# Patient Record
Sex: Female | Born: 1950
Health system: Southern US, Community
[De-identification: ages and names within clinical notes are randomized; demographics above are authoritative.]

## PROBLEM LIST (undated history)

## (undated) DIAGNOSIS — E785 Hyperlipidemia, unspecified: Secondary | ICD-10-CM

## (undated) DIAGNOSIS — A159 Respiratory tuberculosis unspecified: Secondary | ICD-10-CM

## (undated) DIAGNOSIS — M1990 Unspecified osteoarthritis, unspecified site: Secondary | ICD-10-CM

## (undated) DIAGNOSIS — F32A Depression, unspecified: Secondary | ICD-10-CM

## (undated) DIAGNOSIS — H269 Unspecified cataract: Secondary | ICD-10-CM

## (undated) DIAGNOSIS — E7800 Pure hypercholesterolemia, unspecified: Secondary | ICD-10-CM

## (undated) DIAGNOSIS — Z86010 Personal history of colonic polyps: Secondary | ICD-10-CM

## (undated) DIAGNOSIS — T7840XA Allergy, unspecified, initial encounter: Secondary | ICD-10-CM

## (undated) DIAGNOSIS — F329 Major depressive disorder, single episode, unspecified: Secondary | ICD-10-CM

## (undated) DIAGNOSIS — R011 Cardiac murmur, unspecified: Secondary | ICD-10-CM

## (undated) DIAGNOSIS — S52122A Displaced fracture of head of left radius, initial encounter for closed fracture: Secondary | ICD-10-CM

## (undated) DIAGNOSIS — F419 Anxiety disorder, unspecified: Secondary | ICD-10-CM

## (undated) DIAGNOSIS — M810 Age-related osteoporosis without current pathological fracture: Secondary | ICD-10-CM

## (undated) DIAGNOSIS — K219 Gastro-esophageal reflux disease without esophagitis: Secondary | ICD-10-CM

## (undated) HISTORY — DX: Major depressive disorder, single episode, unspecified: F32.9

## (undated) HISTORY — DX: Unspecified cataract: H26.9

## (undated) HISTORY — DX: Allergy, unspecified, initial encounter: T78.40XA

## (undated) HISTORY — PX: CATARACT EXTRACTION: SUR2

## (undated) HISTORY — DX: Unspecified osteoarthritis, unspecified site: M19.90

## (undated) HISTORY — PX: POLYPECTOMY: SHX149

## (undated) HISTORY — DX: Respiratory tuberculosis unspecified: A15.9

## (undated) HISTORY — DX: Age-related osteoporosis without current pathological fracture: M81.0

## (undated) HISTORY — DX: Hyperlipidemia, unspecified: E78.5

## (undated) HISTORY — DX: Cardiac murmur, unspecified: R01.1

## (undated) HISTORY — DX: Anxiety disorder, unspecified: F41.9

## (undated) HISTORY — PX: COLONOSCOPY: SHX174

## (undated) HISTORY — DX: Personal history of colonic polyps: Z86.010

## (undated) HISTORY — DX: Depression, unspecified: F32.A

---

## 2003-01-18 ENCOUNTER — Encounter: Admission: RE | Admit: 2003-01-18 | Discharge: 2003-01-18 | Payer: Self-pay | Admitting: Internal Medicine

## 2003-01-18 ENCOUNTER — Ambulatory Visit (HOSPITAL_COMMUNITY): Admission: RE | Admit: 2003-01-18 | Discharge: 2003-01-18 | Payer: Self-pay | Admitting: Internal Medicine

## 2004-09-23 ENCOUNTER — Ambulatory Visit: Payer: Self-pay | Admitting: Internal Medicine

## 2004-09-24 ENCOUNTER — Ambulatory Visit: Payer: Self-pay | Admitting: Internal Medicine

## 2004-09-28 ENCOUNTER — Ambulatory Visit: Payer: Self-pay | Admitting: *Deleted

## 2004-10-16 ENCOUNTER — Ambulatory Visit: Payer: Self-pay | Admitting: Internal Medicine

## 2004-10-30 ENCOUNTER — Ambulatory Visit (HOSPITAL_COMMUNITY): Admission: RE | Admit: 2004-10-30 | Discharge: 2004-10-30 | Payer: Self-pay | Admitting: Family Medicine

## 2004-11-26 ENCOUNTER — Ambulatory Visit: Payer: Self-pay | Admitting: Internal Medicine

## 2005-02-05 ENCOUNTER — Ambulatory Visit: Payer: Self-pay | Admitting: Internal Medicine

## 2005-03-09 ENCOUNTER — Ambulatory Visit: Payer: Self-pay | Admitting: Internal Medicine

## 2005-04-20 ENCOUNTER — Ambulatory Visit: Payer: Self-pay | Admitting: Internal Medicine

## 2005-09-23 ENCOUNTER — Ambulatory Visit: Payer: Self-pay | Admitting: Internal Medicine

## 2005-12-13 ENCOUNTER — Ambulatory Visit: Payer: Self-pay | Admitting: Internal Medicine

## 2006-01-12 ENCOUNTER — Ambulatory Visit: Payer: Self-pay | Admitting: Internal Medicine

## 2006-01-12 ENCOUNTER — Encounter: Payer: Self-pay | Admitting: Internal Medicine

## 2006-01-20 ENCOUNTER — Ambulatory Visit (HOSPITAL_COMMUNITY): Admission: RE | Admit: 2006-01-20 | Discharge: 2006-01-20 | Payer: Self-pay | Admitting: Family Medicine

## 2006-03-10 ENCOUNTER — Ambulatory Visit: Payer: Self-pay | Admitting: Internal Medicine

## 2006-03-14 ENCOUNTER — Ambulatory Visit: Payer: Self-pay | Admitting: Internal Medicine

## 2006-04-07 ENCOUNTER — Ambulatory Visit: Payer: Self-pay | Admitting: Internal Medicine

## 2006-08-05 ENCOUNTER — Ambulatory Visit: Payer: Self-pay | Admitting: Internal Medicine

## 2006-11-16 ENCOUNTER — Ambulatory Visit: Payer: Self-pay | Admitting: Family Medicine

## 2006-12-22 ENCOUNTER — Ambulatory Visit: Payer: Self-pay | Admitting: Internal Medicine

## 2007-01-02 ENCOUNTER — Ambulatory Visit: Payer: Self-pay | Admitting: Internal Medicine

## 2007-01-03 ENCOUNTER — Ambulatory Visit: Payer: Self-pay | Admitting: Internal Medicine

## 2007-02-08 ENCOUNTER — Ambulatory Visit (HOSPITAL_COMMUNITY): Admission: RE | Admit: 2007-02-08 | Discharge: 2007-02-08 | Payer: Self-pay | Admitting: Family Medicine

## 2007-08-16 ENCOUNTER — Encounter (INDEPENDENT_AMBULATORY_CARE_PROVIDER_SITE_OTHER): Payer: Self-pay | Admitting: *Deleted

## 2007-09-11 ENCOUNTER — Telehealth (INDEPENDENT_AMBULATORY_CARE_PROVIDER_SITE_OTHER): Payer: Self-pay | Admitting: *Deleted

## 2007-09-18 ENCOUNTER — Ambulatory Visit: Payer: Self-pay | Admitting: Family Medicine

## 2007-10-03 ENCOUNTER — Ambulatory Visit: Payer: Self-pay | Admitting: Nurse Practitioner

## 2007-10-03 DIAGNOSIS — K029 Dental caries, unspecified: Secondary | ICD-10-CM | POA: Insufficient documentation

## 2007-10-03 DIAGNOSIS — K219 Gastro-esophageal reflux disease without esophagitis: Secondary | ICD-10-CM | POA: Insufficient documentation

## 2007-10-03 LAB — CONVERTED CEMR LAB
ALT: 14 units/L (ref 0–35)
AST: 18 units/L (ref 0–37)
Albumin: 4.5 g/dL (ref 3.5–5.2)
Alkaline Phosphatase: 71 units/L (ref 39–117)
BUN: 14 mg/dL (ref 6–23)
Basophils Absolute: 0 10*3/uL (ref 0.0–0.1)
Basophils Relative: 0 % (ref 0–1)
Bilirubin Urine: NEGATIVE
CO2: 23 meq/L (ref 19–32)
Calcium: 9.5 mg/dL (ref 8.4–10.5)
Chlamydia, DNA Probe: NEGATIVE
Chloride: 105 meq/L (ref 96–112)
Creatinine, Ser: 0.68 mg/dL (ref 0.40–1.20)
Eosinophils Absolute: 0.1 10*3/uL (ref 0.0–0.7)
Eosinophils Relative: 2 % (ref 0–5)
GC Probe Amp, Genital: NEGATIVE
Glucose, Bld: 71 mg/dL (ref 70–99)
Glucose, Urine, Semiquant: NEGATIVE
HCT: 40.8 % (ref 36.0–46.0)
Hemoglobin: 13.8 g/dL (ref 12.0–15.0)
KOH Prep: NEGATIVE
Ketones, urine, test strip: NEGATIVE
Lymphocytes Relative: 27 % (ref 12–46)
Lymphs Abs: 1.3 10*3/uL (ref 0.7–3.3)
MCHC: 33.8 g/dL (ref 30.0–36.0)
MCV: 94.4 fL (ref 78.0–100.0)
Monocytes Absolute: 0.2 10*3/uL (ref 0.2–0.7)
Monocytes Relative: 5 % (ref 3–11)
Neutro Abs: 3.2 10*3/uL (ref 1.7–7.7)
Neutrophils Relative %: 66 % (ref 43–77)
Nitrite: NEGATIVE
Platelets: 270 10*3/uL (ref 150–400)
Potassium: 4.3 meq/L (ref 3.5–5.3)
Protein, U semiquant: NEGATIVE
RBC: 4.32 M/uL (ref 3.87–5.11)
RDW: 13 % (ref 11.5–14.0)
Sodium: 142 meq/L (ref 135–145)
Specific Gravity, Urine: >=1.03
TSH: 1.821 microintl units/mL (ref 0.350–5.50)
Total Bilirubin: 0.5 mg/dL (ref 0.3–1.2)
Total Protein: 7.5 g/dL (ref 6.0–8.3)
Urobilinogen, UA: 0.2
WBC Urine, dipstick: NEGATIVE
WBC: 4.9 10*3/uL (ref 4.0–10.5)
pH: 5

## 2007-10-05 ENCOUNTER — Encounter (INDEPENDENT_AMBULATORY_CARE_PROVIDER_SITE_OTHER): Payer: Self-pay | Admitting: Nurse Practitioner

## 2008-05-06 ENCOUNTER — Ambulatory Visit: Payer: Self-pay | Admitting: Nurse Practitioner

## 2008-05-06 DIAGNOSIS — H9190 Unspecified hearing loss, unspecified ear: Secondary | ICD-10-CM | POA: Insufficient documentation

## 2008-05-07 ENCOUNTER — Ambulatory Visit (HOSPITAL_COMMUNITY): Admission: RE | Admit: 2008-05-07 | Discharge: 2008-05-07 | Payer: Self-pay | Admitting: Nurse Practitioner

## 2008-05-07 DIAGNOSIS — M899 Disorder of bone, unspecified: Secondary | ICD-10-CM | POA: Insufficient documentation

## 2008-05-07 DIAGNOSIS — M949 Disorder of cartilage, unspecified: Secondary | ICD-10-CM

## 2008-05-21 ENCOUNTER — Encounter (INDEPENDENT_AMBULATORY_CARE_PROVIDER_SITE_OTHER): Payer: Self-pay | Admitting: Nurse Practitioner

## 2008-05-21 LAB — CONVERTED CEMR LAB
OCCULT 1: NEGATIVE
OCCULT 2: NEGATIVE
OCCULT 3: NEGATIVE

## 2008-05-23 ENCOUNTER — Encounter (INDEPENDENT_AMBULATORY_CARE_PROVIDER_SITE_OTHER): Payer: Self-pay | Admitting: Nurse Practitioner

## 2008-05-23 ENCOUNTER — Ambulatory Visit (HOSPITAL_COMMUNITY): Admission: RE | Admit: 2008-05-23 | Discharge: 2008-05-23 | Payer: Self-pay | Admitting: Internal Medicine

## 2008-05-28 ENCOUNTER — Telehealth (INDEPENDENT_AMBULATORY_CARE_PROVIDER_SITE_OTHER): Payer: Self-pay | Admitting: Nurse Practitioner

## 2008-05-30 ENCOUNTER — Encounter (INDEPENDENT_AMBULATORY_CARE_PROVIDER_SITE_OTHER): Payer: Self-pay | Admitting: Nurse Practitioner

## 2008-06-03 ENCOUNTER — Telehealth (INDEPENDENT_AMBULATORY_CARE_PROVIDER_SITE_OTHER): Payer: Self-pay | Admitting: Nurse Practitioner

## 2008-07-11 ENCOUNTER — Telehealth (INDEPENDENT_AMBULATORY_CARE_PROVIDER_SITE_OTHER): Payer: Self-pay | Admitting: Nurse Practitioner

## 2008-07-16 ENCOUNTER — Telehealth (INDEPENDENT_AMBULATORY_CARE_PROVIDER_SITE_OTHER): Payer: Self-pay | Admitting: Nurse Practitioner

## 2008-07-22 ENCOUNTER — Ambulatory Visit: Payer: Self-pay | Admitting: Nurse Practitioner

## 2008-07-22 DIAGNOSIS — F341 Dysthymic disorder: Secondary | ICD-10-CM | POA: Insufficient documentation

## 2008-07-24 ENCOUNTER — Emergency Department (HOSPITAL_COMMUNITY): Admission: EM | Admit: 2008-07-24 | Discharge: 2008-07-24 | Payer: Self-pay | Admitting: Emergency Medicine

## 2008-07-24 ENCOUNTER — Telehealth (INDEPENDENT_AMBULATORY_CARE_PROVIDER_SITE_OTHER): Payer: Self-pay | Admitting: Nurse Practitioner

## 2008-11-26 ENCOUNTER — Telehealth (INDEPENDENT_AMBULATORY_CARE_PROVIDER_SITE_OTHER): Payer: Self-pay | Admitting: Nurse Practitioner

## 2008-12-05 ENCOUNTER — Ambulatory Visit: Payer: Self-pay | Admitting: Nurse Practitioner

## 2008-12-05 DIAGNOSIS — J209 Acute bronchitis, unspecified: Secondary | ICD-10-CM | POA: Insufficient documentation

## 2008-12-05 DIAGNOSIS — N9489 Other specified conditions associated with female genital organs and menstrual cycle: Secondary | ICD-10-CM | POA: Insufficient documentation

## 2008-12-05 LAB — CONVERTED CEMR LAB
ALT: 18 units/L (ref 0–35)
AST: 15 units/L (ref 0–37)
Albumin: 4.4 g/dL (ref 3.5–5.2)
Alkaline Phosphatase: 60 units/L (ref 39–117)
BUN: 17 mg/dL (ref 6–23)
Basophils Absolute: 0 10*3/uL (ref 0.0–0.1)
Basophils Relative: 1 % (ref 0–1)
CO2: 20 meq/L (ref 19–32)
Calcium: 8.9 mg/dL (ref 8.4–10.5)
Chloride: 105 meq/L (ref 96–112)
Creatinine, Ser: 0.64 mg/dL (ref 0.40–1.20)
Eosinophils Absolute: 0.1 10*3/uL (ref 0.0–0.7)
Eosinophils Relative: 3 % (ref 0–5)
Glucose, Bld: 80 mg/dL (ref 70–99)
HCT: 42.1 % (ref 36.0–46.0)
Hemoglobin: 13.8 g/dL (ref 12.0–15.0)
Lymphocytes Relative: 30 % (ref 12–46)
Lymphs Abs: 1.2 10*3/uL (ref 0.7–4.0)
MCHC: 32.8 g/dL (ref 30.0–36.0)
MCV: 95.7 fL (ref 78.0–100.0)
Monocytes Absolute: 0.4 10*3/uL (ref 0.1–1.0)
Monocytes Relative: 11 % (ref 3–12)
Neutro Abs: 2.3 10*3/uL (ref 1.7–7.7)
Neutrophils Relative %: 56 % (ref 43–77)
Platelets: 256 10*3/uL (ref 150–400)
Potassium: 4.5 meq/L (ref 3.5–5.3)
RBC: 4.4 M/uL (ref 3.87–5.11)
RDW: 13.7 % (ref 11.5–15.5)
Sodium: 140 meq/L (ref 135–145)
Total Bilirubin: 0.3 mg/dL (ref 0.3–1.2)
Total Protein: 7.1 g/dL (ref 6.0–8.3)
WBC: 4.1 10*3/uL (ref 4.0–10.5)

## 2008-12-06 ENCOUNTER — Encounter (INDEPENDENT_AMBULATORY_CARE_PROVIDER_SITE_OTHER): Payer: Self-pay | Admitting: Nurse Practitioner

## 2008-12-06 ENCOUNTER — Ambulatory Visit (HOSPITAL_COMMUNITY): Admission: RE | Admit: 2008-12-06 | Discharge: 2008-12-06 | Payer: Self-pay | Admitting: Internal Medicine

## 2008-12-09 ENCOUNTER — Encounter (INDEPENDENT_AMBULATORY_CARE_PROVIDER_SITE_OTHER): Payer: Self-pay | Admitting: Nurse Practitioner

## 2008-12-31 ENCOUNTER — Ambulatory Visit: Payer: Self-pay | Admitting: Nurse Practitioner

## 2008-12-31 ENCOUNTER — Encounter (INDEPENDENT_AMBULATORY_CARE_PROVIDER_SITE_OTHER): Payer: Self-pay | Admitting: Nurse Practitioner

## 2008-12-31 DIAGNOSIS — R3129 Other microscopic hematuria: Secondary | ICD-10-CM | POA: Insufficient documentation

## 2009-01-01 ENCOUNTER — Encounter (INDEPENDENT_AMBULATORY_CARE_PROVIDER_SITE_OTHER): Payer: Self-pay | Admitting: Nurse Practitioner

## 2009-01-02 ENCOUNTER — Ambulatory Visit: Payer: Self-pay | Admitting: Family Medicine

## 2009-01-02 ENCOUNTER — Ambulatory Visit (HOSPITAL_COMMUNITY): Admission: RE | Admit: 2009-01-02 | Discharge: 2009-01-02 | Payer: Self-pay

## 2009-01-02 ENCOUNTER — Encounter (INDEPENDENT_AMBULATORY_CARE_PROVIDER_SITE_OTHER): Payer: Self-pay | Admitting: Nurse Practitioner

## 2009-01-03 ENCOUNTER — Telehealth (INDEPENDENT_AMBULATORY_CARE_PROVIDER_SITE_OTHER): Payer: Self-pay | Admitting: Nurse Practitioner

## 2009-01-03 DIAGNOSIS — J984 Other disorders of lung: Secondary | ICD-10-CM | POA: Insufficient documentation

## 2009-01-07 ENCOUNTER — Ambulatory Visit (HOSPITAL_COMMUNITY): Admission: RE | Admit: 2009-01-07 | Discharge: 2009-01-07 | Payer: Self-pay | Admitting: Family Medicine

## 2009-01-07 ENCOUNTER — Encounter (INDEPENDENT_AMBULATORY_CARE_PROVIDER_SITE_OTHER): Payer: Self-pay | Admitting: Nurse Practitioner

## 2009-01-10 ENCOUNTER — Encounter (INDEPENDENT_AMBULATORY_CARE_PROVIDER_SITE_OTHER): Payer: Self-pay | Admitting: Nurse Practitioner

## 2009-01-13 ENCOUNTER — Ambulatory Visit (HOSPITAL_COMMUNITY): Admission: RE | Admit: 2009-01-13 | Discharge: 2009-01-13 | Payer: Self-pay | Admitting: Internal Medicine

## 2009-01-13 ENCOUNTER — Encounter (INDEPENDENT_AMBULATORY_CARE_PROVIDER_SITE_OTHER): Payer: Self-pay | Admitting: Nurse Practitioner

## 2009-01-14 ENCOUNTER — Encounter (INDEPENDENT_AMBULATORY_CARE_PROVIDER_SITE_OTHER): Payer: Self-pay | Admitting: Nurse Practitioner

## 2009-03-24 ENCOUNTER — Ambulatory Visit: Payer: Self-pay | Admitting: Nurse Practitioner

## 2009-03-24 DIAGNOSIS — E7800 Pure hypercholesterolemia, unspecified: Secondary | ICD-10-CM | POA: Insufficient documentation

## 2009-03-24 LAB — CONVERTED CEMR LAB
ALT: 21 units/L (ref 0–35)
AST: 20 units/L (ref 0–37)
Albumin: 4.3 g/dL (ref 3.5–5.2)
Alkaline Phosphatase: 60 units/L (ref 39–117)
Bilirubin Urine: NEGATIVE
Bilirubin, Direct: 0.1 mg/dL (ref 0.0–0.3)
Cholesterol, target level: 200 mg/dL
Cholesterol: 188 mg/dL (ref 0–200)
Glucose, Urine, Semiquant: NEGATIVE
HDL goal, serum: 40 mg/dL
HDL: 62 mg/dL (ref >39)
Indirect Bilirubin: 0.3 mg/dL (ref 0.0–0.9)
Ketones, urine, test strip: NEGATIVE
LDL Cholesterol: 112 mg/dL — ABNORMAL HIGH (ref 0–99)
LDL Goal: 160 mg/dL
Magnesium: 2.2 mg/dL (ref 1.5–2.5)
Nitrite: NEGATIVE
Protein, U semiquant: NEGATIVE
Specific Gravity, Urine: >=1.03
Total Bilirubin: 0.4 mg/dL (ref 0.3–1.2)
Total CHOL/HDL Ratio: 3
Total Protein: 7 g/dL (ref 6.0–8.3)
Triglycerides: 68 mg/dL (ref <150)
Urobilinogen, UA: 0.2
VLDL: 14 mg/dL (ref 0–40)
Vit D, 25-Hydroxy: 33 ng/mL (ref 30–89)
WBC Urine, dipstick: NEGATIVE
pH: 5

## 2009-03-25 ENCOUNTER — Encounter (INDEPENDENT_AMBULATORY_CARE_PROVIDER_SITE_OTHER): Payer: Self-pay | Admitting: Nurse Practitioner

## 2009-07-25 ENCOUNTER — Ambulatory Visit: Payer: Self-pay | Admitting: Nurse Practitioner

## 2009-07-25 LAB — CONVERTED CEMR LAB
Bilirubin Urine: NEGATIVE
Glucose, Urine, Semiquant: NEGATIVE
Ketones, urine, test strip: NEGATIVE
Nitrite: NEGATIVE
Protein, U semiquant: NEGATIVE
Specific Gravity, Urine: 1.025
Urobilinogen, UA: 0.2
pH: 5

## 2009-07-26 ENCOUNTER — Encounter (INDEPENDENT_AMBULATORY_CARE_PROVIDER_SITE_OTHER): Payer: Self-pay | Admitting: Nurse Practitioner

## 2009-12-22 ENCOUNTER — Telehealth (INDEPENDENT_AMBULATORY_CARE_PROVIDER_SITE_OTHER): Payer: Self-pay | Admitting: Nurse Practitioner

## 2009-12-22 ENCOUNTER — Ambulatory Visit: Payer: Self-pay | Admitting: Nurse Practitioner

## 2009-12-22 ENCOUNTER — Ambulatory Visit (HOSPITAL_COMMUNITY): Admission: RE | Admit: 2009-12-22 | Discharge: 2009-12-22 | Payer: Self-pay | Admitting: Nurse Practitioner

## 2009-12-22 DIAGNOSIS — N39498 Other specified urinary incontinence: Secondary | ICD-10-CM | POA: Insufficient documentation

## 2009-12-22 DIAGNOSIS — M25529 Pain in unspecified elbow: Secondary | ICD-10-CM | POA: Insufficient documentation

## 2009-12-22 LAB — CONVERTED CEMR LAB
ALT: 17 units/L (ref 0–35)
AST: 15 units/L (ref 0–37)
Albumin: 4.4 g/dL (ref 3.5–5.2)
Alkaline Phosphatase: 70 units/L (ref 39–117)
BUN: 19 mg/dL (ref 6–23)
CO2: 23 meq/L (ref 19–32)
Calcium: 9.2 mg/dL (ref 8.4–10.5)
Chloride: 103 meq/L (ref 96–112)
Cholesterol: 238 mg/dL — ABNORMAL HIGH (ref 0–200)
Creatinine, Ser: 0.65 mg/dL (ref 0.40–1.20)
Glucose, Bld: 93 mg/dL (ref 70–99)
HDL: 64 mg/dL (ref >39)
LDL Cholesterol: 153 mg/dL — ABNORMAL HIGH (ref 0–99)
Potassium: 4.4 meq/L (ref 3.5–5.3)
Sodium: 140 meq/L (ref 135–145)
Total Bilirubin: 0.4 mg/dL (ref 0.3–1.2)
Total CHOL/HDL Ratio: 3.7
Total Protein: 7 g/dL (ref 6.0–8.3)
Triglycerides: 105 mg/dL (ref <150)
VLDL: 21 mg/dL (ref 0–40)

## 2009-12-24 ENCOUNTER — Encounter (INDEPENDENT_AMBULATORY_CARE_PROVIDER_SITE_OTHER): Payer: Self-pay | Admitting: Nurse Practitioner

## 2010-01-29 ENCOUNTER — Ambulatory Visit: Payer: Self-pay | Admitting: Nurse Practitioner

## 2010-01-30 ENCOUNTER — Encounter (INDEPENDENT_AMBULATORY_CARE_PROVIDER_SITE_OTHER): Payer: Self-pay | Admitting: Nurse Practitioner

## 2010-02-06 ENCOUNTER — Ambulatory Visit (HOSPITAL_COMMUNITY): Admission: RE | Admit: 2010-02-06 | Discharge: 2010-02-06 | Payer: Self-pay | Admitting: Internal Medicine

## 2010-02-17 ENCOUNTER — Ambulatory Visit: Payer: Self-pay | Admitting: Nurse Practitioner

## 2010-08-27 ENCOUNTER — Ambulatory Visit: Payer: Self-pay | Admitting: Nurse Practitioner

## 2010-08-27 DIAGNOSIS — R1012 Left upper quadrant pain: Secondary | ICD-10-CM | POA: Insufficient documentation

## 2010-08-27 DIAGNOSIS — J309 Allergic rhinitis, unspecified: Secondary | ICD-10-CM | POA: Insufficient documentation

## 2010-08-27 LAB — CONVERTED CEMR LAB
Bilirubin Urine: NEGATIVE
Glucose, Urine, Semiquant: NEGATIVE
Ketones, urine, test strip: NEGATIVE
Nitrite: NEGATIVE
Specific Gravity, Urine: 1.015
Urobilinogen, UA: 0.2
pH: 6

## 2010-08-28 ENCOUNTER — Encounter (INDEPENDENT_AMBULATORY_CARE_PROVIDER_SITE_OTHER): Payer: Self-pay | Admitting: Nurse Practitioner

## 2010-09-02 ENCOUNTER — Telehealth (INDEPENDENT_AMBULATORY_CARE_PROVIDER_SITE_OTHER): Payer: Self-pay | Admitting: Nurse Practitioner

## 2010-09-07 ENCOUNTER — Telehealth (INDEPENDENT_AMBULATORY_CARE_PROVIDER_SITE_OTHER): Payer: Self-pay | Admitting: Nurse Practitioner

## 2010-09-23 ENCOUNTER — Ambulatory Visit: Payer: Self-pay | Admitting: Nurse Practitioner

## 2010-09-23 LAB — CONVERTED CEMR LAB
Bilirubin Urine: NEGATIVE
Blood in Urine, dipstick: NEGATIVE
Glucose, Urine, Semiquant: NEGATIVE
Ketones, urine, test strip: NEGATIVE
Nitrite: NEGATIVE
Protein, U semiquant: NEGATIVE
Specific Gravity, Urine: 1.02
Urobilinogen, UA: 0.2
WBC Urine, dipstick: NEGATIVE
pH: 5.5

## 2010-10-16 ENCOUNTER — Ambulatory Visit: Payer: Self-pay | Admitting: Nurse Practitioner

## 2010-10-16 LAB — CONVERTED CEMR LAB
ALT: 22 units/L (ref 0–35)
AST: 19 units/L (ref 0–37)
Albumin: 4.5 g/dL (ref 3.5–5.2)
Alkaline Phosphatase: 60 units/L (ref 39–117)
Bilirubin, Direct: 0.1 mg/dL (ref 0.0–0.3)
Cholesterol: 173 mg/dL (ref 0–200)
HDL: 58 mg/dL (ref >39)
Indirect Bilirubin: 0.2 mg/dL (ref 0.0–0.9)
LDL Cholesterol: 97 mg/dL (ref 0–99)
Total Bilirubin: 0.3 mg/dL (ref 0.3–1.2)
Total CHOL/HDL Ratio: 3
Total Protein: 6.9 g/dL (ref 6.0–8.3)
Triglycerides: 90 mg/dL (ref <150)
VLDL: 18 mg/dL (ref 0–40)

## 2010-10-19 ENCOUNTER — Encounter (INDEPENDENT_AMBULATORY_CARE_PROVIDER_SITE_OTHER): Payer: Self-pay | Admitting: Nurse Practitioner

## 2010-12-27 LAB — CONVERTED CEMR LAB
ALT: 17 units/L (ref 0–35)
ALT: 22 units/L (ref 0–35)
AST: 17 units/L (ref 0–37)
AST: 20 units/L (ref 0–37)
Albumin: 4.5 g/dL (ref 3.5–5.2)
Albumin: 4.5 g/dL (ref 3.5–5.2)
Alkaline Phosphatase: 53 units/L (ref 39–117)
Alkaline Phosphatase: 59 units/L (ref 39–117)
BUN: 15 mg/dL (ref 6–23)
Basophils Absolute: 0 10*3/uL (ref 0.0–0.1)
Basophils Absolute: 0 10*3/uL (ref 0.0–0.1)
Basophils Relative: 1 % (ref 0–1)
Basophils Relative: 1 % (ref 0–1)
Bilirubin Urine: NEGATIVE
Bilirubin Urine: NEGATIVE
Bilirubin, Direct: 0.1 mg/dL (ref 0.0–0.3)
CA 125: 6.8 units/mL (ref 0.0–30.2)
CA 125: 7.5 units/mL (ref 0.0–30.2)
CO2: 20 meq/L (ref 19–32)
Calcium: 9 mg/dL (ref 8.4–10.5)
Chlamydia, DNA Probe: NEGATIVE
Chlamydia, DNA Probe: NEGATIVE
Chloride: 106 meq/L (ref 96–112)
Cholesterol: 188 mg/dL (ref 0–200)
Cholesterol: 237 mg/dL — ABNORMAL HIGH (ref 0–200)
Creatinine, Ser: 0.7 mg/dL (ref 0.40–1.20)
Eosinophils Absolute: 0.1 10*3/uL (ref 0.0–0.7)
Eosinophils Absolute: 0.1 10*3/uL (ref 0.0–0.7)
Eosinophils Relative: 2 % (ref 0–5)
Eosinophils Relative: 3 % (ref 0–5)
GC Probe Amp, Genital: NEGATIVE
GC Probe Amp, Genital: NEGATIVE
Glucose, Bld: 75 mg/dL (ref 70–99)
Glucose, Urine, Semiquant: NEGATIVE
Glucose, Urine, Semiquant: NEGATIVE
HCT: 40.9 % (ref 36.0–46.0)
HCT: 42.1 % (ref 36.0–46.0)
HDL: 67 mg/dL (ref >39)
HDL: 68 mg/dL (ref >39)
Hemoglobin: 13.3 g/dL (ref 12.0–15.0)
Hemoglobin: 13.7 g/dL (ref 12.0–15.0)
Indirect Bilirubin: 0.4 mg/dL (ref 0.0–0.9)
KOH Prep: NEGATIVE
KOH Prep: NEGATIVE
Ketones, urine, test strip: NEGATIVE
Ketones, urine, test strip: NEGATIVE
LDL Cholesterol: 109 mg/dL — ABNORMAL HIGH (ref 0–99)
LDL Cholesterol: 152 mg/dL — ABNORMAL HIGH (ref 0–99)
Lymphocytes Relative: 31 % (ref 12–46)
Lymphocytes Relative: 31 % (ref 12–46)
Lymphs Abs: 1.3 10*3/uL (ref 0.7–4.0)
Lymphs Abs: 1.7 10*3/uL (ref 0.7–4.0)
MCHC: 32.5 g/dL (ref 30.0–36.0)
MCHC: 32.5 g/dL (ref 30.0–36.0)
MCV: 94.5 fL (ref 78.0–100.0)
MCV: 96.1 fL (ref 78.0–100.0)
Monocytes Absolute: 0.3 10*3/uL (ref 0.1–1.0)
Monocytes Absolute: 0.3 10*3/uL (ref 0.1–1.0)
Monocytes Relative: 6 % (ref 3–12)
Monocytes Relative: 7 % (ref 3–12)
Neutro Abs: 2.4 10*3/uL (ref 1.7–7.7)
Neutro Abs: 3.3 10*3/uL (ref 1.7–7.7)
Neutrophils Relative %: 57 % (ref 43–77)
Neutrophils Relative %: 60 % (ref 43–77)
Nitrite: NEGATIVE
Nitrite: NEGATIVE
OCCULT 1: NEGATIVE
Pap Smear: NEGATIVE
Platelets: 246 10*3/uL (ref 150–400)
Platelets: 263 10*3/uL (ref 150–400)
Potassium: 4.4 meq/L (ref 3.5–5.3)
Protein, U semiquant: NEGATIVE
RBC: 4.33 M/uL (ref 3.87–5.11)
RBC: 4.38 M/uL (ref 3.87–5.11)
RDW: 13.3 % (ref 11.5–15.5)
RDW: 13.3 % (ref 11.5–15.5)
Rapid HIV Screen: NEGATIVE
Sodium: 142 meq/L (ref 135–145)
Specific Gravity, Urine: 1.025
Specific Gravity, Urine: >=1.03
TSH: 1.474 microintl units/mL (ref 0.350–4.50)
TSH: 1.551 microintl units/mL (ref 0.350–4.500)
Total Bilirubin: 0.4 mg/dL (ref 0.3–1.2)
Total Bilirubin: 0.5 mg/dL (ref 0.3–1.2)
Total CHOL/HDL Ratio: 2.8
Total CHOL/HDL Ratio: 3.5
Total Protein: 7.1 g/dL (ref 6.0–8.3)
Total Protein: 7.4 g/dL (ref 6.0–8.3)
Triglycerides: 62 mg/dL (ref <150)
Triglycerides: 85 mg/dL (ref <150)
Urobilinogen, UA: 0.2
Urobilinogen, UA: 0.2
VLDL: 12 mg/dL (ref 0–40)
VLDL: 17 mg/dL (ref 0–40)
WBC Urine, dipstick: NEGATIVE
WBC Urine, dipstick: NEGATIVE
WBC: 4.2 10*3/uL (ref 4.0–10.5)
WBC: 5.5 10*3/uL (ref 4.0–10.5)
pH: 5
pH: 5.5

## 2010-12-29 NOTE — Progress Notes (Signed)
Summary: needs refill   Phone Note Call from Patient   Summary of Call: STILL HAVING PAIN WHEN SHE GOES TO BATHROOM AND IT IS WORST PLEASE NEED MEDICINE BEFORE WEEKEND //[facility]///[phone] Initial call taken by: Arta Bruce,  September 02, 2010 9:28 AM  Follow-up for Phone Call        pt aware of above information of urine culture.  She says that she needs a refill on Loratidine sent to [facility] pharmacy. Follow-up by: Levon Hedger,  September 03, 2010 11:39 AM  Additional Follow-up for Phone Call Additional follow up Details #1::        loratadine was sent to the pharmacy on 08/27/2010 there is another open phone note -- is this the pt we spoke with the pharmacy about. Ok to give bactrim DS for urinary symptoms Additional Follow-up by: Lehman Prom FNP,  September 03, 2010 2:15 PM    Additional Follow-up for Phone Call Additional follow up Details #2::    noted. pt informed. Follow-up by: Levon Hedger,  September 04, 2010 4:56 PM

## 2010-12-29 NOTE — Progress Notes (Signed)
Summary: MEDS PROBLEM  Phone Note Call from Patient Call back at [phone]   Caller: Patient Reason for Call: Talk to Nurse Summary of Call: PT HAS HERE ON 10.07.11 THEY GIVE HER MEDS FOR UTI, SHE START TAKEN THE MEDICINE AND ONE OF THE SYMPTOMS WAS FEVER SO SHE STOP THE MEDECINE BECAUSE SHE WAS WIIT FEVER 101 AND 100.6, SHE NEED TO CHANGE THE MEDECINE SHE STILL WITH THE SAME SYMPTOMS. SHE IS TAKEN TREMTHO-SULFAMETHOX 160, LORATAVINE 10 MG.  Initial call taken by: Domenic Polite,  September 07, 2010 8:48 AM  Follow-up for Phone Call        Sent to N. Daphine Deutscher.  Dutch Quint RN  September 09, 2010 3:36 PM   Additional Follow-up for Phone Call Additional follow up Details #1::        the reason pt is having fever is NOT because of the medication but rather because of the worsening infection.  it would have taken at least 3 of the doses to get the medication into her system before it would help to decrease the symptoms I fear that infection could be worsening so I would suggest that she restart the antibiotic ASAP Additional Follow-up by: Lehman Prom FNP,  September 09, 2010 3:41 PM    Additional Follow-up for Phone Call Additional follow up Details #2::    States fever went up to 101 and greater than 100 for a couple of days.  Denies other symptoms.  Advised per provider's response and instructions.  Also advised to take ibuprofen and tylenol, alternating, for the fever and general discomfort.  To call back if symptoms worsen or persist.  Verbalized understanding and agreement. Follow-up by: Dutch Quint RN,  September 10, 2010 4:10 PM

## 2010-12-29 NOTE — Letter (Signed)
Summary: *HSN Results Follow up  [facility]  [address] [place], [place] [address]   Phone: [phone]  Fax: [phone]      01/30/2010   CARLIE SOLORZANO [facility] [address] [place], [place]  [address]   Dear  Ms. Magenta HERNANDEZ-FRANCO,                            ____S.Drinkard,FNP   ____D. Gore,FNP       ____B. McPherson,MD   ____V. Rankins,MD    ____E. Mulberry,MD    __X__N. Daphine Deutscher, FNP  ____D. Reche Dixon, MD    ____K. Philipp Deputy, MD    ____Other     This letter is to inform you that your recent test(s):  _______Pap Smear    ___X____Lab Test     _______X-ray    ___X____ is within acceptable limits  _______ requires a medication change  _______ requires a follow-up lab visit  _______ requires a follow-up visit with your provider   Comments: Labs done during recent office visit normal.  Pap Smear results are ______________________________.       _________________________________________________________ If you have any questions, please contact our office [phone].                    Sincerely,    Lehman Prom FNP [facility]

## 2010-12-29 NOTE — Assessment & Plan Note (Signed)
Summary: Complete Physical Exam   Vital Signs:  Patient profile:   60 year old female Menstrual status:  postmenopausal Height:      65 inches Weight:      151 pounds BMI:     25.22 Temp:     98.1 degrees F oral Pulse rate:   71 / minute Pulse rhythm:   regular Resp:     18 per minute BP sitting:   96 / 65  (left arm) Cuff size:   regular  Vitals Entered By: [place] Shannon (January 29, 2010 9:02 AM)  Nutrition Counseling: Patient's BMI is greater than 25 and therefore counseled on weight management options.  History of Present Illness:  Pt into the office for a complete physical exam  Pap - done 1 year ago in this office. postmenopausal x at least 10 years pt has concerns about discoloration in her vaginal area. some pain in the area but no discharge. Pt is not married.  Mammogram - last done 1 year ago no self breast exams at home no family history of breast cancer  Tdpa - due in 2012  optho - wears glasses.  last eye exam was 4 years ago. pt is aware that she needs to get her eyes checked.  dental - no recent dental exam  Lipid Management History:      Negative NCEP/ATP III risk factors include female age less than 88 years old, no history of early menopause without estrogen hormone replacement, non-diabetic, HDL cholesterol greater than 60, no family history for ischemic heart disease, non-tobacco-user status, no ASHD (atherosclerotic heart disease), no prior stroke/TIA, no peripheral vascular disease, and no history of aortic aneurysm.        The patient states that she knows about the "Therapeutic Lifestyle Change" diet.  Her compliance with the TLC diet is good.  The patient does not know about adjunctive measures for cholesterol lowering.  She expresses no side effects from her lipid-lowering medication.  Comments include: Pravachol was increased during her last visit.  she is fasting today for labs.  The patient denies any symptoms to suggest myopathy or liver disease.      Habits & Providers  Alcohol-Tobacco-Diet     Alcohol drinks/day: 0     Tobacco Status: quit     Year Quit: 2006     Passive Smoke Exposure: no  Exercise-Depression-Behavior     Does Patient Exercise: no     Have you felt down or hopeless? no     Have you felt little pleasure in things? no     Depression Counseling: not indicated; screening negative for depression     Drug Use: no     Seat Belt Use: 100     Sun Exposure: frequently  Comments: Pt has questions about wine - drinking red wine at night - does that help with her cholesterol. Also due to her work schedule she is not exercising PHQ-9 score = 2  Current Medications (verified): 1)  Protonix 40 Mg  Pack (Pantoprazole Sodium) .Marland Kitchen.. 1 Tablet Daily For Stomach 2)  Ibuprofen 800 Mg  Tabs (Ibuprofen) .Marland Kitchen.. 1 Tablet By Mouth Two Times A Day As Needed For Pain 3)  Calcium-D 600-200 Mg-Unit  Caps (Calcium Carbonate-Vitamin D) .Marland Kitchen.. 1 Tablet By Mouth Two Times A Day 4)  Alendronate Sodium 35 Mg Tabs (Alendronate Sodium) .... One Tablet By Mouth Weekly For Bones 5)  Pravastatin Sodium 40 Mg Tabs (Pravastatin Sodium) .... One Tablet By Mouth Nightly As Needed  For Cholesterol **note Change in Dose** 6)  Oxybutynin Chloride 5 Mg Tabs (Oxybutynin Chloride) .... One Tablet By Mouth Two Times A Day For Bladder  Allergies (verified): 1)  ! * Tramodol  Review of Systems General:  Denies fever. Eyes:  Denies blurring. ENT:  Denies earache. CV:  Denies chest pain or discomfort. Resp:  Denies cough. GI:  Complains of constipation; denies abdominal pain, nausea, and vomiting; intermittent constipation - improves when she eats foods with fiber. GU:  Denies dysuria; vaginal dryness and irritation at times. MS:  Denies joint pain. Derm:  Denies rash. Neuro:  Denies headaches. Psych:  Denies anxiety and depression.  Physical Exam  General:  alert.   Head:  normocephalic.   Eyes:  pupils equal and pupils round.  eyes Ears:  bil TM  with bony landmarks present no erythema Nose:  no nasal discharge.   Mouth:  pharynx pink and moist.  discoloration Neck:  supple.   Chest Wall:  no mass.   Breasts:  venous pattern to left breast no lumps noted Lungs:  normal breath sounds.   Heart:  normal rate and regular rhythm.   Abdomen:  soft, non-tender, and normal bowel sounds.   Rectal:  no external abnormalities.  guaiac negative Msk:  up to the exam table Pulses:  R radial normal and L radial normal.   Extremities:  no edema Neurologic:  alert & oriented X3.   Skin:  color normal.   Psych:  Oriented X3.    Pelvic Exam  Vulva:      abnormal pigmentation.  left labia - discoloration and dilation  Urethra and Bladder:      Urethra--normal.   Vagina:      post-menopausal.   Cervix:      midposition.   Uterus:      smooth.   Adnexa:      nontender bilaterally.   Rectum:      normal, heme negative stool.      Impression & Recommendations:  Problem # 1:  ROUTINE GYNECOLOGICAL EXAMINATION (ICD-V72.31) labs done PAP done PHQ-9 score = 2 guaiac negative tdap up to date recommended optho and dental exam EKG done - NSR Will refer to GYN for vaginal discoloration -most likely due to menopause but pt would like referral. Offered pt vaginal cream for dryness and irritation but she has declined at this time Orders: Rapid HIV  ([address]) UA Dipstick w/o Micro (manual) ([address]) Hemoccult Guaiac-1 spec.(in office) ([address]70) KOH/ WET Mount [phone]) Pap Smear, Thin Prep ( Collection of) (Q0091) T- GC Chlamydia ([address]11) EKG w/ Interpretation (93000)  Problem # 2:  OTHER SCREENING BREAST EXAMINATION (ICD-V76.19) encouraged self breast exam mammogram ordered Orders: Mammogram (Screening) (Mammo)  Problem # 3:  HYPERCHOLESTEROLEMIA (ICD-272.0) will check lipids today Her updated medication list for this problem includes:    Pravastatin Sodium 40 Mg Tabs (Pravastatin sodium) ..... One tablet by mouth nightly as needed for  cholesterol **note change in dose**  Orders: T-Lipid Profile ([address]1478-29562) T-Hepatic Function ([address]-57846)  Problem # 4:  OSTEOPENIA (ICD-733.90) advised pt to take meds as ordered Her updated medication list for this problem includes:    Calcium-d 600-200 Mg-unit Caps (Calcium carbonate-vitamin d) .Marland Kitchen... 1 tablet by mouth two times a day    Alendronate Sodium 35 Mg Tabs (Alendronate sodium) ..... One tablet by mouth weekly for bones  Complete Medication List: 1)  Protonix 40 Mg Pack (Pantoprazole sodium) .Marland Kitchen.. 1 tablet daily for stomach 2)  Ibuprofen 800 Mg Tabs (Ibuprofen) .Marland KitchenMarland KitchenMarland Kitchen  1 tablet by mouth two times a day as needed for pain 3)  Calcium-d 600-200 Mg-unit Caps (Calcium carbonate-vitamin d) .Marland Kitchen.. 1 tablet by mouth two times a day 4)  Alendronate Sodium 35 Mg Tabs (Alendronate sodium) .... One tablet by mouth weekly for bones 5)  Pravastatin Sodium 40 Mg Tabs (Pravastatin sodium) .... One tablet by mouth nightly as needed for cholesterol **note change in dose** 6)  Oxybutynin Chloride 5 Mg Tabs (Oxybutynin chloride) .... One tablet by mouth two times a day for bladder  Other Orders: T-CBC w/Diff (43329-51884) T-TSH (16606-30160) T-CA 125 (109[address]-35573)  Lipid Assessment/Plan:      Based on NCEP/ATP III, the patient's risk factor category is "0-1 risk factors".  The patient's lipid goals are as follows: Total cholesterol goal is 200; LDL cholesterol goal is 160; HDL cholesterol goal is 40; Triglyceride goal is 150.    Patient Instructions: 1)  You will be notified of any abnormal lab results 2)  Keep your appointment for mammogram 3)  Follow up in this office in 6 months or sooner if necessary. 4)  Schedule an appointment at GYN volunteer clinic at [facility] [address] for assessment of vaginal issue.  Laboratory Results   Urine Tests  Date/Time Received: January 29, 2010 9:16 AM   Routine Urinalysis   Glucose: negative   (Normal Range: Negative) Bilirubin: negative   (Normal Range:  Negative) Ketone: negative   (Normal Range: Negative) Spec. Gravity: >=1.030   (Normal Range: 1.003-1.035) Blood: trace-lysed   (Normal Range: Negative) pH: 5.5   (Normal Range: 5.0-8.0) Protein: trace   (Normal Range: Negative) Urobilinogen: 0.2   (Normal Range: 0-1) Nitrite: negative   (Normal Range: Negative) Leukocyte Esterace: negative   (Normal Range: Negative)    Date/Time Received: January 29, 2010 10:06 AM   Wet Mount Source: vaginal WBC/hpf: 1-5 Bacteria/hpf: rare Clue cells/hpf: none Yeast/hpf: none Wet Mount KOH: Negative Trichomonas/hpf: none  Other Tests  Rapid HIV: negative  Stool - Occult Blood Hemmoccult #1: negative Date: 01/29/2010    Prevention & Chronic Care Immunizations   Influenza vaccine: refused  (01/29/2010)   Influenza vaccine deferral: Refused  (12/22/2009)    Tetanus booster: 11/29/2000: per pt    Pneumococcal vaccine: Not documented  Colorectal Screening   Hemoccult: negative  (10/03/2007)   Hemoccult action/deferral: Ordered  (01/29/2010)   Hemoccult due: 01/30/2011    Colonoscopy: test explained to pt and she will consider in the future  (10/03/2007)  Other Screening   Pap smear:  Specimen Adequacy: Satisfactory for evaluation.   Interpretation/Result:Negative for intraepithelial Lesion or Malignancy.     (12/31/2008)   Pap smear action/deferral: PAP smear done  (12/31/2008)   Pap smear due: 01/30/2011    Mammogram: ASSESSMENT: Negative - BI-RADS 1^MM DIGITAL SCREENING  (01/07/2009)   Mammogram action/deferral: mammogram ordered  (12/31/2008)   Smoking status: quit  (01/29/2010)  Lipids   Total Cholesterol: 238  (12/22/2009)   Lipid panel action/deferral: Lipid Panel ordered   LDL: 153  (12/22/2009)   LDL Direct: Not documented   HDL: 64  (12/22/2009)   Triglycerides: 105  (12/22/2009)    SGOT (AST): 15  (12/22/2009)   BMP action: Ordered   SGPT (ALT): 17  (12/22/2009)   Alkaline phosphatase: 70  (12/22/2009)    Total bilirubin: 0.4  (12/22/2009)  Self-Management Support :   Personal Goals (by the next clinic visit) :      Personal LDL goal: 100  (01/29/2010)    Patient will work  on the following items until the next clinic visit to reach self-care goals:     Medications and monitoring: take my medicines every day  (01/29/2010)    Lipid self-management support: Not documented     EKG  Procedure date:  01/29/2010  Findings:      normal:  rate 64    Osteoporosis  Patient reports: Personal history of fracture no Caucasian/Asian Race No Advanced Age No Female Gender Yes Dementia No Poor health/fragility No Current cigarette smoker No Low body weight (<127 lbs) No Estrogen deficiency Yes Low calcium intake (lifelong) No Alcoholism No Inadequate physical activity No Poor eyesight/risk of falls No

## 2010-12-29 NOTE — Letter (Signed)
Summary: Work Excuse  [facility]  [address] [place], [place] [address]   Phone: [phone]  Fax: [phone]    Today's Date: December 24, 2009  Name of Patient: Lisa Costa [facility]  The above named patient had a medical visit on January 24th, 2011. She has been out of work since January 14th for an injury to her left elbow.  She has been evaluated and may return to work, however she must wear an ace wrap to the left elbow for the next 2 weeks.  Please take this into consideration when reviewing the time away from work.  She may return to work on January 27th, 2011.   Sincerely yours,   Lehman Prom FNP [facility]

## 2010-12-29 NOTE — Progress Notes (Signed)
Summary: Office Visit//DEPRESSION SCREENING  Office Visit//DEPRESSION SCREENING   Imported By: Arta Bruce 03/26/2010 15:51:01  _____________________________________________________________________  External Attachment:    Type:   Image     Comment:   External Document

## 2010-12-29 NOTE — Letter (Signed)
Summary: Lipid Letter  [facility]  [address] [place], [place] [address]   Phone: [phone]  Fax: [phone]    10/19/2010  [facility] [address] [place], [place]  [address]  Dear Lisa Costa:  We have carefully reviewed your last lipid profile from 10/16/2010 and the results are noted below with a summary of recommendations for lipid management.    Cholesterol:       173     Goal: less than 200   HDL "good" Cholesterol:   58     Goal: greater than 40   LDL "bad" Cholesterol:   97     Goal: less than 130   Triglycerides:       90     Goal: less than 150    Cholesterol is better.  Continue taking current medications.    Current Medications: 1)    Protonix 40 Mg  Pack (Pantoprazole sodium) .Marland Kitchen.. 1 tablet daily for stomach 2)    Ibuprofen 800 Mg  Tabs (Ibuprofen) .Marland Kitchen.. 1 tablet by mouth two times a day as needed for pain 3)    Calcium-d 600-200 Mg-unit  Caps (Calcium carbonate-vitamin d) .Marland Kitchen.. 1 tablet by mouth two times a day 4)    Alendronate Sodium 35 Mg Tabs (Alendronate sodium) .... One tablet by mouth weekly for bones 5)    Pravastatin Sodium 40 Mg Tabs (Pravastatin sodium) .... One tablet by mouth nightly as needed for cholesterol **note change in dose** 6)    Loratadine 10 Mg Tabs (Loratadine) .... One tablet by mouth daily for allergies  If you have any questions, please call. We appreciate being able to work with you.   Sincerely,    [facility] Lehman Prom FNP

## 2010-12-29 NOTE — Assessment & Plan Note (Signed)
Summary: F/u on Abdominal Pain   Vital Signs:  Patient profile:   60 year old female Menstrual status:  postmenopausal Weight:      150.3 pounds BMI:     25.10 Temp:     98.5 degrees F oral Pulse rate:   76 / minute Pulse rhythm:   regular Resp:     20 per minute BP sitting:   100 / 80  (left arm) Cuff size:   regular  Vitals Entered By: Levon Hedger (September 23, 2010 10:40 AM)  Nutrition Counseling: Patient's BMI is greater than 25 and therefore counseled on weight management options. CC: follow-up visit, Lipid Management Is Patient Diabetic? No Pain Assessment Patient in pain? no       Does patient need assistance? Functional Status Self care Ambulation Normal   CC:  follow-up visit and Lipid Management.  History of Present Illness:  Pt into the office for f/u on abdominal pain. Pt reports that pain has resolved at this time Constipation has resolved Pt also had UTI noted on last visit.  She had several calls back into this office about the fever.  She was instructed to restart ther antibiotic and she took without event. She took all the antibiotic and symptoms have resolved at this time  Lipid Management History:      Negative NCEP/ATP III risk factors include female age less than 55 years old, no history of early menopause without estrogen hormone replacement, non-diabetic, HDL cholesterol greater than 60, no family history for ischemic heart disease, non-tobacco-user status, no ASHD (atherosclerotic heart disease), no prior stroke/TIA, no peripheral vascular disease, and no history of aortic aneurysm.        The patient states that she knows about the "Therapeutic Lifestyle Change" diet.  Her compliance with the TLC diet is good.     Allergies: 1)  ! * Tramodol  Review of Systems General:  Denies fever. CV:  Denies chest pain or discomfort. Resp:  Denies cough. GI:  Denies abdominal pain, vomiting, and vomiting blood.  Physical Exam  General:  alert.    Head:  normocephalic.   Eyes:  glasses Msk:  right foot with circumscribed lesion on planter surface of foot - slight bruising between 4th and 5th toes Neurologic:  alert & oriented X3.     Impression & Recommendations:  Problem # 1:  Hx of LUQ PAIN (ICD-789.02)  likely due to cystitis pt has taken antibiotics - symptoms clear u/a ok today  Orders: UA Dipstick w/o Micro (manual) (25956)  Problem # 2:  HYPERCHOLESTEROLEMIA (ICD-272.0) will check on next visit Her updated medication list for this problem includes:    Pravastatin Sodium 40 Mg Tabs (Pravastatin sodium) ..... One tablet by mouth nightly as needed for cholesterol **note change in dose**  Problem # 3:  FOOT PAIN (ICD-729.5) advised pt to wear supportive shoes  elevated leg may apply warm compresses f/u if symptoms continue  Complete Medication List: 1)  Protonix 40 Mg Pack (Pantoprazole sodium) .Marland Kitchen.. 1 tablet daily for stomach 2)  Ibuprofen 800 Mg Tabs (Ibuprofen) .Marland Kitchen.. 1 tablet by mouth two times a day as needed for pain 3)  Calcium-d 600-200 Mg-unit Caps (Calcium carbonate-vitamin d) .Marland Kitchen.. 1 tablet by mouth two times a day 4)  Alendronate Sodium 35 Mg Tabs (Alendronate sodium) .... One tablet by mouth weekly for bones 5)  Pravastatin Sodium 40 Mg Tabs (Pravastatin sodium) .... One tablet by mouth nightly as needed for cholesterol **note change in dose** 6)  Loratadine 10 Mg Tabs (Loratadine) .... One tablet by mouth daily for allergies  Lipid Assessment/Plan:      Based on NCEP/ATP III, the patient's risk factor category is "0-1 risk factors".  The patient's lipid goals are as follows: Total cholesterol goal is 200; LDL cholesterol goal is 160; HDL cholesterol goal is 40; Triglyceride goal is 150.    Patient Instructions: 1)  Follow up in 4 weeks for fasting labs - lipids, lft 2)  No foods after midnight before this visit 3)  Follow up with n.martin,fnp in 4 months for cholesterol and bones   Orders Added: 1)   Est. Patient Level III [62130] 2)  UA Dipstick w/o Micro (manual) [81002]    Laboratory Results   Urine Tests  Date/Time Received: September 23, 2010 11:10 AM   Routine Urinalysis   Color: lt. yellow Appearance: Clear Glucose: negative   (Normal Range: Negative) Bilirubin: negative   (Normal Range: Negative) Ketone: negative   (Normal Range: Negative) Spec. Gravity: 1.020   (Normal Range: 1.003-1.035) Blood: negative   (Normal Range: Negative) pH: 5.5   (Normal Range: 5.0-8.0) Protein: negative   (Normal Range: Negative) Urobilinogen: 0.2   (Normal Range: 0-1) Nitrite: negative   (Normal Range: Negative) Leukocyte Esterace: negative   (Normal Range: Negative)
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
## 2010-12-29 NOTE — Letter (Signed)
Summary: *HSN Results Follow up  [facility]  [address] [place], [place] [address]   Phone: [phone]  Fax: [phone]      08/27/2010   YEE GANGI [facility] [address] [place], [place]  [address]   To whom it may concern:  The above patient had a positive PPD in February 2010.  She had both a CXRAY and CT chest which were negative of any active disease.  She was also referred to the health department also for assessment.       _________________________________________________________ If you have any questions, please contact our office [phone].                    Sincerely,    Lehman Prom FNP [facility]

## 2010-12-29 NOTE — Progress Notes (Signed)
Summary: X-ray results  Medications Added PRAVASTATIN SODIUM 40 MG TABS (PRAVASTATIN SODIUM) One tablet by mouth nightly as needed for cholesterol **note change in dose**      Phone Note Outgoing Call   Summary of Call: notify pt that elbow is NOT fractured she does however has soft bones and she should continue to take actonel (new rx sent to pharmacy) AND calcium with vitamin D over the counter supplement two times a day There is some fluid around the joint but she shoudl wear the ace wrap as given during her visit she can return to work  - note in basket also notify pt that cholesterol is high.  she will need to increase pravastatin to 40mg  by mouth nightly (rx sent to [facility] pharmacy electronically) Actonel is no longer available at the pharmacy. she will need to get fosamax 35mg  by mouth weekly (rx sent electronially to [facility])  same directions as with actonel Initial call taken by: Lehman Prom FNP,  December 22, 2009 5:57 PM  Follow-up for Phone Call        pt notified and states that she was already taking calcium with vitamin D but she will continue. She wants to know if her note can be changed to tomorrow since we are just now talking with her today. Advised will follow up with provider and give her a call back after it is reviewed. Follow-up by: Mikey College CMA,  December 24, 2009 9:39 AM  Additional Follow-up for Phone Call Additional follow up Details #1::        note done for pt to pick up Additional Follow-up by: Lehman Prom FNP,  December 24, 2009 12:15 PM    New/Updated Medications: PRAVASTATIN SODIUM 40 MG TABS (PRAVASTATIN SODIUM) One tablet by mouth nightly as needed for cholesterol **note change in dose**  Phone Note Outgoing Call   Summary of Call: notify pt that elbow is NOT fractured she does however has soft bones and she should continue to take actonel (new rx sent to pharmacy) AND calcium with vitamin D over the counter supplement two times a day There is  some fluid around the joint but she shoudl wear the ace wrap as given during her visit she can return to work  - note in basket also notify pt that cholesterol is high.  she will need to increase pravastatin to 40mg  by mouth nightly (rx sent to [facility] pharmacy electronically) Actonel is no longer available at the pharmacy. she will need to get fosamax 35mg  by mouth weekly (rx sent electronially to [facility])  same directions as with actonel Initial call taken by: Lehman Prom FNP,  December 22, 2009 5:57 PM  Follow-up for Phone Call        pt notified and states that she was already taking calcium with vitamin D but she will continue. She wants to know if her note can be changed to tomorrow since we are just now talking with her today. Advised will follow up with provider and give her a call back after it is reviewed. Follow-up by: Mikey College CMA,  December 24, 2009 9:39 AM  Additional Follow-up for Phone Call Additional follow up Details #1::        note done for pt to pick up Additional Follow-up by: Lehman Prom FNP,  December 24, 2009 12:15 PM    New/Updated Medications: PRAVASTATIN SODIUM 40 MG TABS (PRAVASTATIN SODIUM) One tablet by mouth nightly as needed for cholesterol **note change in dose** Prescriptions: PRAVASTATIN SODIUM  40 MG TABS (PRAVASTATIN SODIUM) One tablet by mouth nightly as needed for cholesterol **note change in dose**  #30 x 5   Entered and Authorized by:   Lehman Prom FNP   Signed by:   Lehman Prom FNP on 12/23/2009   Method used:   Faxed to ...       [facility] - Pharmac (retail)       [address] [place], [place]  [address]       Ph: [phone]       Fax: [phone]   RxID:   [id number]

## 2010-12-29 NOTE — Assessment & Plan Note (Signed)
Summary: Allergic Rhinitis   Vital Signs:  Patient profile:   60 year old female Menstrual status:  postmenopausal Weight:      145.9 pounds Pulse rate:   70 / minute Pulse rhythm:   regular Resp:     16 per minute BP sitting:   110 / 70  (left arm) Cuff size:   regular  Vitals Entered By: Levon Hedger (August 27, 2010 11:51 AM) CC: pain in lower left abdomen that bothers her when she moves or pushes on it, Lipid Management, Abdominal Pain Is Patient Diabetic? No Pain Assessment Patient in pain? no       Does patient need assistance? Functional Status Self care Ambulation Normal   CC:  pain in lower left abdomen that bothers her when she moves or pushes on it, Lipid Management, and Abdominal Pain.  History of Present Illness:  Pt into the office with c/o left upper abd pain.        This is a 60 year old woman who presents with Abdominal Pain.  The symptoms began 1 week ago.  On a scale of 1 to 10, the intensity is described as a 3.  The patient reports constipation, but denies nausea, vomiting, and diarrhea.  The location of the pain is left upper quadrant.  The pain is described as intermittent.  Associated symptoms include dysuria.  The patient denies the following symptoms: fever, weight loss, and chest pain.    Lipid Management History:      Negative NCEP/ATP III risk factors include female age less than 74 years old, no history of early menopause without estrogen hormone replacement, non-diabetic, HDL cholesterol greater than 60, no family history for ischemic heart disease, non-tobacco-user status, no ASHD (atherosclerotic heart disease), no prior stroke/TIA, no peripheral vascular disease, and no history of aortic aneurysm.        The patient states that she knows about the "Therapeutic Lifestyle Change" diet.  Her compliance with the TLC diet is good.     Current Medications (verified): 1)  Protonix 40 Mg  Pack (Pantoprazole Sodium) .Marland Kitchen.. 1 Tablet Daily For  Stomach 2)  Ibuprofen 800 Mg  Tabs (Ibuprofen) .Marland Kitchen.. 1 Tablet By Mouth Two Times A Day As Needed For Pain 3)  Calcium-D 600-200 Mg-Unit  Caps (Calcium Carbonate-Vitamin D) .Marland Kitchen.. 1 Tablet By Mouth Two Times A Day 4)  Alendronate Sodium 35 Mg Tabs (Alendronate Sodium) .... One Tablet By Mouth Weekly For Bones 5)  Pravastatin Sodium 40 Mg Tabs (Pravastatin Sodium) .... One Tablet By Mouth Nightly As Needed For Cholesterol **note Change in Dose** 6)  Oxybutynin Chloride 5 Mg Tabs (Oxybutynin Chloride) .... One Tablet By Mouth Two Times A Day For Bladder 7)  Loratadine 10 Mg Tabs (Loratadine) .... One Tablet By Mouth Daily For Allergies  Allergies (verified): 1)  ! * Tramodol  Review of Systems General:  Denies fever. ENT:  c/o nasal dryness after she went to the beach.  Also with some throat irritation.. CV:  Denies chest pain or discomfort. Resp:  Complains of cough. GI:  Complains of abdominal pain; denies nausea and vomiting; left upper quadrant. Allergy:  Complains of sneezing.  Physical Exam  General:  alert.   Head:  normocephalic.   Eyes:  glasses Ears:  bil TM with bony landmarks present - clear fluid Lungs:  normal breath sounds.   Heart:  normal rate and regular rhythm.     Impression & Recommendations:  Problem # 1:  MICROSCOPIC HEMATURIA (ICD-599.72)  Orders: UA Dipstick w/o Micro (manual) ([address]) T-Culture, Urine ([id number])  Problem # 2:  ALLERGIC RHINITIS (ICD-477.9)  Her updated medication list for this problem includes:    Loratadine 10 Mg Tabs (Loratadine) ..... One tablet by mouth daily for allergies  Problem # 3:  LUQ PAIN (ICD-789.02) advised pt to improve her bowel habits  will f/u on next visit  Complete Medication List: 1)  Protonix 40 Mg Pack (Pantoprazole sodium) .Marland Kitchen.. 1 tablet daily for stomach 2)  Ibuprofen 800 Mg Tabs (Ibuprofen) .Marland Kitchen.. 1 tablet by mouth two times a day as needed for pain 3)  Calcium-d 600-200 Mg-unit Caps (Calcium  carbonate-vitamin d) .Marland Kitchen.. 1 tablet by mouth two times a day 4)  Alendronate Sodium 35 Mg Tabs (Alendronate sodium) .... One tablet by mouth weekly for bones 5)  Pravastatin Sodium 40 Mg Tabs (Pravastatin sodium) .... One tablet by mouth nightly as needed for cholesterol **note change in dose** 6)  Loratadine 10 Mg Tabs (Loratadine) .... One tablet by mouth daily for allergies  Other Orders: Influenza Vaccine NON MCR ([address])  Lipid Assessment/Plan:      Based on NCEP/ATP III, the patient's risk factor category is "0-1 risk factors".  The patient's lipid goals are as follows: Total cholesterol goal is 200; LDL cholesterol goal is 160; HDL cholesterol goal is 40; Triglyceride goal is 150.    Patient Instructions: 1)  Upper abdominal pain - may be due to a change in your bowel habits.  Be sure to restart a high fiber diet with plenty of water.  2)  Nasal dryness and throat irritation - may be due to allergies. 3)  Take loratadine 10mg  by mouth daily  4)  You have been given the flu vaccine today. 5)  Follow up in 3-4 weeks to check on abdominal pain and nasal congestion. Prescriptions: PRAVASTATIN SODIUM 40 MG TABS (PRAVASTATIN SODIUM) One tablet by mouth nightly as needed for cholesterol **note change in dose**  #30 x 5   Entered and Authorized by:   Lehman Prom FNP   Signed by:   Lehman Prom FNP on 08/27/2010   Method used:   Faxed to ...       [facility] - Pharmac (retail)       [address] [place], [place]  [address]       Ph: [phone]       Fax: [phone]   RxID:   [id number] IBUPROFEN 800 MG  TABS (IBUPROFEN) 1 tablet by mouth two times a day as needed for pain  #50 x 1   Entered and Authorized by:   Lehman Prom FNP   Signed by:   Lehman Prom FNP on 08/27/2010   Method used:   Faxed to ...       [facility] - Pharmac (retail)       [address] [place], [place]  [address]       Ph:  [phone]       Fax: [phone]   RxID:   [id number] PROTONIX 40 MG  PACK (PANTOPRAZOLE SODIUM) 1 tablet daily for stomach  #30 x 6   Entered and Authorized by:   Lehman Prom FNP   Signed by:   Lehman Prom FNP on 08/27/2010   Method used:   Faxed to ...       [facility] - Pharmac (retail)  [address] [place], [place]  [address]       Ph: [phone]       Fax: [phone]   RxID:   [id number] LORATADINE 10 MG TABS (LORATADINE) One tablet by mouth daily for allergies  #30 x 1   Entered and Authorized by:   Lehman Prom FNP   Signed by:   Lehman Prom FNP on 08/27/2010   Method used:   Faxed to ...       [facility] [place] [facility] - Pharmac (retail)       [address] [place], [place]  [address]       Ph: [phone]       Fax: [phone]   RxID:   [id number]   Laboratory Results   Urine Tests  Date/Time Received: August 27, 2010 12:13 PM   Routine Urinalysis   Color: dk yellow Appearance: Cloudy Glucose: negative   (Normal Range: Negative) Bilirubin: negative   (Normal Range: Negative) Ketone: negative   (Normal Range: Negative) Spec. Gravity: 1.015   (Normal Range: 1.003-1.035) Blood: small   (Normal Range: Negative) pH: 6.0   (Normal Range: 5.0-8.0) Protein: trace   (Normal Range: Negative) Urobilinogen: 0.2   (Normal Range: 0-1) Nitrite: negative   (Normal Range: Negative) Leukocyte Esterace: large   (Normal Range: Negative)       Prevention & Chronic Care Immunizations   Influenza vaccine: Fluvax Non-MCR  (08/27/2010)   Influenza vaccine deferral: Refused  (12/22/2009)    Tetanus booster: 11/29/2000: per pt    Pneumococcal vaccine: Not documented  Colorectal Screening   Hemoccult: negative  (10/03/2007)   Hemoccult action/deferral: Ordered  (01/29/2010)   Hemoccult due: 01/30/2011    Colonoscopy: test explained to pt and she will consider  in the future  (10/03/2007)  Other Screening   Pap smear:  Specimen Adequacy: Satisfactory for evaluation.   Interpretation/Result:Negative for intraepithelial Lesion or Malignancy.     (01/29/2010)   Pap smear action/deferral: PAP smear done  (12/31/2008)   Pap smear due: 01/2011    Mammogram: ASSESSMENT: Negative - BI-RADS 1^MM DIGITAL SCREENING  (02/06/2010)   Mammogram action/deferral: mammogram ordered  (12/31/2008)   Smoking status: quit  (01/29/2010)  Lipids   Total Cholesterol: 188  (01/29/2010)   Lipid panel action/deferral: Lipid Panel ordered   LDL: 109  (01/29/2010)   LDL Direct: Not documented   HDL: 67  (01/29/2010)   Triglycerides: 62  (01/29/2010)    SGOT (AST): 20  (01/29/2010)   BMP action: Ordered   SGPT (ALT): 22  (01/29/2010)   Alkaline phosphatase: 59  (01/29/2010)   Total bilirubin: 0.5  (01/29/2010)  Self-Management Support :   Personal Goals (by the next clinic visit) :      Personal LDL goal: 100  (01/29/2010)    Lipid self-management support: Not documented     Influenza Vaccine    Vaccine Type: Fluvax Non-MCR    Site: left deltoid    Mfr: GlaxoSmithKline    Dose: 0.5 ml    Route: IM    Given by: Michelle Nasuti    Exp. Date: 05/29/2011    Lot #: [id number]    VIS given: 06/23/10 version given August 27, 2010.  Flu Vaccine Consent Questions    Do you have a history of severe allergic reactions to this vaccine? no    Any prior history of allergic reactions to egg and/or gelatin? no    Do  you have a sensitivity to the preservative Thimersol? no    Do you have a past history of Guillan-Barre Syndrome? no    Do you currently have an acute febrile illness? no    Have you ever had a severe reaction to latex? no    Vaccine information given and explained to patient? yes    Are you currently pregnant? no

## 2010-12-29 NOTE — Letter (Signed)
Summary: *Referral Letter  [facility]  [address] [place], [place] [address]   Phone: [phone]  Fax: [phone]    12/22/2009  Lisa Costa [address] [place], [place]  [address]  Phone: [phone]  To whom it may concern:  The above patient was seen in the office today for an injury to her left elbow.  This injury was sustained on January 14th, 2011 during a fall in her apartment complex due to ice.  She has not been able to work since this time.  She has been evaluated today and will have further evaluation for her injury.  Current Medical Problems: 1)  ELBOW PAIN, LEFT (ICD-719.42)    Current Medications:  1)  IBUPROFEN 800 MG  TABS (IBUPROFEN) 1 tablet by mouth two times a day as needed for pain     Please contact us if you have any further questions or need additional information.  Sincerely,    Lehman Prom FNP

## 2010-12-29 NOTE — Letter (Signed)
Summary: GYN REPORT  GYN REPORT   Imported By: Arta Bruce 10/09/2010 10:35:12  _____________________________________________________________________  External Attachment:    Type:   Image     Comment:   External Document

## 2010-12-29 NOTE — Letter (Signed)
Summary: Out of Work  [facility]  [address] [place], [place] [address]   Phone: [phone]  Fax: [phone]    December 22, 2009   Employee:  TILIA FAS[employer]    To Whom It May Concern:   For Medical reasons, please excuse the above named employee from work for the following dates:  Start:   January 14th, 2011 (Date of the injury)  End:   January 24th, 2011 (may return to work)  The patient has been out of work due to an injury to her left elbow. She has been evaluated and may return to work, however she must wear an ace wrap to left elbow for the next 2 weeks.  If you need additional information, please feel free to contact our office.         Sincerely,    Lehman Prom FNP

## 2011-01-29 ENCOUNTER — Encounter: Payer: Self-pay | Admitting: Nurse Practitioner

## 2011-01-29 ENCOUNTER — Encounter (INDEPENDENT_AMBULATORY_CARE_PROVIDER_SITE_OTHER): Payer: Self-pay | Admitting: Nurse Practitioner

## 2011-01-29 DIAGNOSIS — N3000 Acute cystitis without hematuria: Secondary | ICD-10-CM | POA: Insufficient documentation

## 2011-01-29 LAB — CONVERTED CEMR LAB
BUN: 12 mg/dL (ref 6–23)
Basophils Absolute: 0 10*3/uL (ref 0.0–0.1)
Blood in Urine, dipstick: NEGATIVE
CO2: 18 meq/L — ABNORMAL LOW (ref 19–32)
Calcium: 9.6 mg/dL (ref 8.4–10.5)
Chloride: 104 meq/L (ref 96–112)
Creatinine, Ser: 0.59 mg/dL (ref 0.40–1.20)
Eosinophils Relative: 2 % (ref 0–5)
HCT: 38.7 % (ref 36.0–46.0)
Hemoglobin: 13.1 g/dL (ref 12.0–15.0)
Ketones, urine, test strip: NEGATIVE
Lymphocytes Relative: 35 % (ref 12–46)
MCHC: 33.9 g/dL (ref 30.0–36.0)
Monocytes Absolute: 0.2 10*3/uL (ref 0.1–1.0)
Monocytes Relative: 5 % (ref 3–12)
Nitrite: POSITIVE
Protein, U semiquant: NEGATIVE
RBC: 4.17 M/uL (ref 3.87–5.11)
RDW: 12.7 % (ref 11.5–15.5)
pH: 6

## 2011-02-01 ENCOUNTER — Encounter (INDEPENDENT_AMBULATORY_CARE_PROVIDER_SITE_OTHER): Payer: Self-pay | Admitting: Nurse Practitioner

## 2011-02-04 NOTE — Assessment & Plan Note (Signed)
Summary: Acute - Cystitis   Vital Signs:  Patient profile:   60 year old female Menstrual status:  postmenopausal Weight:      151.9 pounds BMI:     25.37 Temp:     98.2 degrees F oral Pulse rate:   65 / minute Pulse rhythm:   regular Resp:     16 per minute BP sitting:   112 / 72  (left arm) Cuff size:   regular  Vitals Entered By: Levon Hedger (January 29, 2011 12:28 PM)  Nutrition Counseling: Patient's BMI is greater than 25 and therefore counseled on weight management options. CC: pt has been having pain in her side and foam in her urine...has been feeling dizzy, Lipid Management, Abdominal Pain Is Patient Diabetic? No Pain Assessment Patient in pain? yes     Location: side  Does patient need assistance? Functional Status Self care Ambulation Normal   CC:  pt has been having pain in her side and foam in her urine...has been feeling dizzy, Lipid Management, and Abdominal Pain.  History of Present Illness:  Pt into the office today for routine f/u but she has some urinary problems today.  tdap  - last in 2002.  will give booster today  Pt did not bring her medications with her today but is able recall.  She is NOT taking actonel becuase she is thinks it is harmful to her liver  Dyspepsia History:      She has no alarm features of dyspepsia including no history of melena, hematochezia, dysphagia, persistent vomiting, or involuntary weight loss > 5%.  There is a prior history of GERD.  The patient does not have a prior history of documented ulcer disease.  The dominant symptom is not heartburn or acid reflux.  An H-2 blocker medication is not currently being taken.    Lipid Management History:      Positive NCEP/ATP III risk factors include female age 39 years old or older.  Negative NCEP/ATP III risk factors include no history of early menopause without estrogen hormone replacement, non-diabetic, no family history for ischemic heart disease, non-tobacco-user status, no  ASHD (atherosclerotic heart disease), no prior stroke/TIA, no peripheral vascular disease, and no history of aortic aneurysm.        The patient states that she knows about the "Therapeutic Lifestyle Change" diet.  Her compliance with the TLC diet is good.  She expresses no side effects from her lipid-lowering medication.  Comments include: labs done 09/2010 .  The patient denies any symptoms to suggest myopathy or liver disease.      Habits & Providers  Alcohol-Tobacco-Diet     Alcohol drinks/day: 0     Tobacco Status: quit     Year Quit: 2006     Passive Smoke Exposure: no  Exercise-Depression-Behavior     Does Patient Exercise: no     Depression Counseling: not indicated; screening negative for depression     Drug Use: no     Seat Belt Use: 100     Sun Exposure: frequently  Allergies (verified): 1)  ! * Tramodol  Review of Systems General:  Complains of fatigue; denies fever. CV:  Denies chest pain or discomfort. Resp:  Denies cough. GI:  Complains of nausea. GU:  Complains of dysuria and urinary frequency; denies hematuria; "Foamy urine".  Physical Exam  General:  alert.   Head:  normocephalic.   Eyes:  glasses Lungs:  normal breath sounds.   Heart:  normal rate and regular  rhythm.   Abdomen:  midepigastric tenderness Msk:  normal ROM.   Neurologic:  alert & oriented X3.   Skin:  color normal.   Psych:  Oriented X3.     Impression & Recommendations:  Problem # 1:  ACUTE CYSTITIS (ICD-595.0)  will send urine for culture Orders: T-Basic Metabolic Panel [id number]) T-CBC w/Diff [phone]) T-Culture, Urine ([id number])  Her updated medication list for this problem includes:    Ciprofloxacin Hcl 500 Mg Tabs (Ciprofloxacin hcl) ..... One tablet by mouth two times a day for urine  Problem # 2:  HEALTH MAINTENANCE EXAM (ICD-V70.0) tdap given today  Problem # 3:  GERD (ICD-530.81) advised pt to avoid foods that worsen the problems Her updated medication  list for this problem includes:    Protonix 40 Mg Pack (Pantoprazole sodium) .Marland Kitchen... 1 tablet daily for stomach  Problem # 4:  OSTEOPENIA (ICD-733.90) pt is not taking actonel as ordered due to ? problems with kidneys reassured pt - will check labs today Her updated medication list for this problem includes:    Calcium-d 600-200 Mg-unit Caps (Calcium carbonate-vitamin d) .Marland Kitchen... 1 tablet by mouth two times a day    Alendronate Sodium 35 Mg Tabs (Alendronate sodium) ..... One tablet by mouth weekly for bones  Complete Medication List: 1)  Protonix 40 Mg Pack (Pantoprazole sodium) .Marland Kitchen.. 1 tablet daily for stomach 2)  Ibuprofen 800 Mg Tabs (Ibuprofen) .Marland Kitchen.. 1 tablet by mouth two times a day as needed for pain 3)  Calcium-d 600-200 Mg-unit Caps (Calcium carbonate-vitamin d) .Marland Kitchen.. 1 tablet by mouth two times a day 4)  Alendronate Sodium 35 Mg Tabs (Alendronate sodium) .... One tablet by mouth weekly for bones 5)  Pravastatin Sodium 40 Mg Tabs (Pravastatin sodium) .... One tablet by mouth nightly as needed for cholesterol **note change in dose** 6)  Loratadine 10 Mg Tabs (Loratadine) .... One tablet by mouth daily for allergies 7)  Ciprofloxacin Hcl 500 Mg Tabs (Ciprofloxacin hcl) .... One tablet by mouth two times a day for urine  Other Orders: Tdap => 25yrs IM (62952) Admin 1st Vaccine (84132) T-TSH [phone])  Lipid Assessment/Plan:      Based on NCEP/ATP III, the patient's risk factor category is "0-1 risk factors".  The patient's lipid goals are as follows: Total cholesterol goal is 200; LDL cholesterol goal is 160; HDL cholesterol goal is 40; Triglyceride goal is 150.    Patient Instructions: 1)  Your urine has infection today.  Take antibiotics as ordered. 2)  Will send urine for culture. 3)  Read the handout and be aware of foods to avoid 4)  Your labs will be checked today and you will be notified of the results. 5)  You have been given the tetanus/pertussis vaccine today and it will be  good for 10 years. 6)  Schedule your  next appointment in 3 months for your complete phyiscal exam. Prescriptions: CIPROFLOXACIN HCL 500 MG TABS (CIPROFLOXACIN HCL) One tablet by mouth two times a day for urine  #14 x 0   Entered and Authorized by:   Lehman Prom FNP   Signed by:   Lehman Prom FNP on 01/29/2011   Method used:   Print then Give to Patient   RxID:   [id number]    Orders Added: 1)  Tdap => 21yrs IM [90715] 2)  Admin 1st Vaccine [90471] 3)  Est. Patient Level IV [75643] 4)  T-Basic Metabolic Panel [[id number]] 5)  T-CBC w/Diff [[id number]] 6)  T-TSH [60630-16010] 7)  T-Culture,  Urine [[id number]]   Immunizations Administered:  Tetanus Vaccine:    Vaccine Type: Tdap    Site: left deltoid    Mfr: Sanofi Pasteur    Dose: 0.5 ml    Route: IM    Given by: Levon Hedger    Exp. Date: 04/27/2013    Lot #: [id number]    VIS given: 10/16/08 version given January 29, 2011.    ndc  93235-573-22  Immunizations Administered:  Tetanus Vaccine:    Vaccine Type: Tdap    Site: left deltoid    Mfr: Sanofi Pasteur    Dose: 0.5 ml    Route: IM    Given by: Levon Hedger    Exp. Date: 04/27/2013    Lot #: [id number]    VIS given: 10/16/08 version given January 29, 2011.  Laboratory Results   Urine Tests  Date/Time Received: January 29, 2011 12:44 PM   Routine Urinalysis   Color: yellow Appearance: Clear Glucose: negative   (Normal Range: Negative) Bilirubin: negative   (Normal Range: Negative) Ketone: negative   (Normal Range: Negative) Spec. Gravity: >=1.030   (Normal Range: 1.003-1.035) Blood: negative   (Normal Range: Negative) pH: 6.0   (Normal Range: 5.0-8.0) Protein: negative   (Normal Range: Negative) Urobilinogen: 0.2   (Normal Range: 0-1) Nitrite: positive   (Normal Range: Negative) Leukocyte Esterace: trace   (Normal Range: Negative)

## 2011-02-09 NOTE — Letter (Signed)
Summary: *HSN Results Follow up  [facility]  [address] [place], [place] [address]   Phone: [phone]  Fax: [phone]      02/01/2011   Lisa Costa [facility] [address] [place], [place]  [address]   Dear  Ms. Lisa Costa,                            ____S.Drinkard,FNP   ____D. Gore,FNP       ____B. McPherson,MD   ____V. Rankins,MD    ____E. Mulberry,MD    _X___N. Daphine Deutscher, FNP  ____D. Reche Dixon, MD    ____K. Philipp Deputy, MD    ____Other     This letter is to inform you that your recent test(s):  _______Pap Smear    ___X____Lab Test     _______X-ray    ___X____ is within acceptable limits  _______ requires a medication change  _______ requires a follow-up lab visit  _______ requires a follow-up visit with your provider   Comments: Labs done during recent office visit are normal.       _________________________________________________________ If you have any questions, please contact our office [phone].                    Sincerely,    Lisa Prom FNP [facility]

## 2011-11-15 ENCOUNTER — Emergency Department (INDEPENDENT_AMBULATORY_CARE_PROVIDER_SITE_OTHER): Payer: Self-pay

## 2011-11-15 ENCOUNTER — Emergency Department (INDEPENDENT_AMBULATORY_CARE_PROVIDER_SITE_OTHER)
Admission: EM | Admit: 2011-11-15 | Discharge: 2011-11-15 | Disposition: A | Discharge status: Home / Self Care | Payer: Self-pay | Source: Home / Self Care | Attending: Family Medicine | Admitting: Family Medicine

## 2011-11-15 DIAGNOSIS — S52509A Unspecified fracture of the lower end of unspecified radius, initial encounter for closed fracture: Secondary | ICD-10-CM

## 2011-11-15 DIAGNOSIS — S52599A Other fractures of lower end of unspecified radius, initial encounter for closed fracture: Secondary | ICD-10-CM

## 2011-11-15 HISTORY — DX: Pure hypercholesterolemia, unspecified: E78.00

## 2011-11-15 HISTORY — DX: Gastro-esophageal reflux disease without esophagitis: K21.9

## 2011-11-15 MED ORDER — HYDROCODONE-ACETAMINOPHEN 5-325 MG PO TABS
ORAL_TABLET | ORAL | Status: AC
  Filled 2011-11-15: qty 2

## 2011-11-15 MED ORDER — HYDROCODONE-ACETAMINOPHEN 5-325 MG PO TABS: ORAL_TABLET | ORAL | Status: AC

## 2011-11-15 MED ORDER — ONDANSETRON 4 MG PO TBDP
4.0000 mg | ORAL_TABLET | Freq: Once | ORAL | Status: AC
  Administered 2011-11-15: 4 mg via ORAL

## 2011-11-15 MED ORDER — HYDROCODONE-ACETAMINOPHEN 5-325 MG PO TABS
2.0000 | ORAL_TABLET | Freq: Once | ORAL | Status: AC
  Administered 2011-11-15: 2 via ORAL

## 2011-11-15 MED ORDER — ONDANSETRON 4 MG PO TBDP
ORAL_TABLET | ORAL | Status: AC
  Filled 2011-11-15: qty 1

## 2011-11-15 NOTE — ED Notes (Signed)
Friday pt. fell and injured left wrist.  In addition, fell on bilateral knees.  Noted left knee swollen with bruise and small abrasion, no bleeding.  Pt. has splint on wrist at present. Noted left wrist swollen with good pulses.  Pt. is able to move fingers and has good sensation.  No deformity noted.  Today took motrin.

## 2011-11-15 NOTE — ED Notes (Signed)
Paged ortho for splint.  Call returned

## 2011-11-15 NOTE — ED Notes (Signed)
Ortho tech here to place cast on pt.  Pt. States her pain level is "better"

## 2011-11-15 NOTE — ED Provider Notes (Signed)
History     CSN: [id number] Arrival date & time: 11/15/2011  1:27 PM   First MD Initiated Contact with Patient 11/15/11 1232      Chief Complaint  Patient presents with  . Wrist Pain    Friday pt. fell and injured left wrist.  Pt. has splint on wrist at present. Noted left wrist swollen with good pulses.  Pt. is able to move fingers and has good sensation.  No deformity noted.     (Consider location/radiation/quality/duration/timing/severity/associated sxs/prior treatment) HPI Comments: Lisa Costa presents for evaluation of pain, decreased range of motion, bruising, and swelling in her LEFT wrist after FOOSH on Friday. She has had it splinted since that time.   Patient is a 60 y.o. female presenting with wrist pain. The history is provided by the patient and a relative.  Wrist Pain This is a new problem. The current episode started more than 2 days ago. The problem occurs constantly. The problem has not changed since onset.The symptoms are aggravated by bending, twisting and exertion.    Past Medical History  Diagnosis Date  . Hypercholesteremia   . GERD (gastroesophageal reflux disease)     Past Surgical History  Procedure Date  . Cesarean section     History reviewed. No pertinent family history.  History  Substance Use Topics  . Smoking status: Former Games developer  . Smokeless tobacco: Never Used  . Alcohol Use: Yes     occational    OB History    Grav Para Term Preterm Abortions TAB SAB Ect Mult Living                  Review of Systems  Constitutional: Negative.   HENT: Negative.   Eyes: Negative.   Respiratory: Negative.   Cardiovascular: Negative.   Gastrointestinal: Negative.   Genitourinary: Negative.   Musculoskeletal: Positive for joint swelling and arthralgias.       Swollen, bruised LEFT wrist  Skin: Negative.   Neurological: Negative.     Allergies  Tramadol  Home Medications   Current Outpatient Rx  Name Route Sig Dispense Refill  .  ATORVASTATIN CALCIUM 40 MG PO TABS Oral Take 40 mg by mouth daily.      . B COMPLEX PO TABS Oral Take 1 tablet by mouth daily.      Marland Kitchen CALCIUM-VITAMIN D 250-125 MG-UNIT PO TABS Oral Take 1 tablet by mouth daily.      Marland Kitchen PANTOPRAZOLE SODIUM 40 MG PO TBEC Oral Take 40 mg by mouth daily.        BP 124/59  Pulse 65  Temp 98.2 F (36.8 C)  Resp 12  SpO2 100%  Physical Exam  Nursing note and vitals reviewed. Constitutional: She is oriented to person, place, and time. She appears well-developed and well-nourished.  HENT:  Head: Normocephalic and atraumatic.  Eyes: EOM are normal.  Neck: Normal range of motion.  Pulmonary/Chest: Effort normal.  Musculoskeletal:       Left wrist: She exhibits decreased range of motion, tenderness, bony tenderness and swelling.       Arms: Neurological: She is alert and oriented to person, place, and time.  Skin: Skin is warm and dry.  Psychiatric: Her behavior is normal.    ED Course  Procedures (including critical care time)  Labs Reviewed - No data to display No results found.   1. Fracture, radius, distal       MDM  Xray: reviewed by radiologist and myself; mild impacted distal radius fracture.  Richardo Priest, MD 12/02/11 0900

## 2011-11-18 ENCOUNTER — Emergency Department (INDEPENDENT_AMBULATORY_CARE_PROVIDER_SITE_OTHER)
Admission: EM | Admit: 2011-11-18 | Discharge: 2011-11-18 | Disposition: A | Discharge status: Home / Self Care | Payer: Self-pay | Source: Home / Self Care | Attending: Family Medicine | Admitting: Family Medicine

## 2011-11-18 ENCOUNTER — Telehealth (HOSPITAL_COMMUNITY): Payer: Self-pay | Admitting: *Deleted

## 2011-11-18 ENCOUNTER — Encounter (HOSPITAL_COMMUNITY): Payer: Self-pay | Admitting: Emergency Medicine

## 2011-11-18 DIAGNOSIS — S52599A Other fractures of lower end of unspecified radius, initial encounter for closed fracture: Secondary | ICD-10-CM

## 2011-11-18 DIAGNOSIS — S52509A Unspecified fracture of the lower end of unspecified radius, initial encounter for closed fracture: Secondary | ICD-10-CM

## 2011-11-18 NOTE — ED Provider Notes (Signed)
History     CSN: [id number]  Arrival date & time 11/18/11  1644   First MD Initiated Contact with Patient 11/18/11 1645      Chief Complaint  Patient presents with  . Re-evaluation    (Consider location/radiation/quality/duration/timing/severity/associated sxs/prior treatment) Patient is a 60 y.o. female presenting with arm injury. The history is provided by the patient.  Arm Injury  The incident occurred more than 2 days ago (seen 12/17 for left distal radius fx, concerned about hand swelling, ecchymosis and tingling in fingers.). The injury mechanism was a fall.    Past Medical History  Diagnosis Date  . Hypercholesteremia   . GERD (gastroesophageal reflux disease)     Past Surgical History  Procedure Date  . Cesarean section     History reviewed. No pertinent family history.  History  Substance Use Topics  . Smoking status: Former Games developer  . Smokeless tobacco: Never Used  . Alcohol Use: Yes     occational    OB History    Grav Para Term Preterm Abortions TAB SAB Ect Mult Living                  Review of Systems  Constitutional: Negative.   Musculoskeletal: Positive for joint swelling.    Allergies  Tramadol  Home Medications   Current Outpatient Rx  Name Route Sig Dispense Refill  . ATORVASTATIN CALCIUM 40 MG PO TABS Oral Take 40 mg by mouth daily.      . B COMPLEX PO TABS Oral Take 1 tablet by mouth daily.      Marland Kitchen CALCIUM-VITAMIN D 250-125 MG-UNIT PO TABS Oral Take 1 tablet by mouth daily.      Marland Kitchen HYDROCODONE-ACETAMINOPHEN 5-325 MG PO TABS  Take one to two tablets every 4 to 6 hours as needed for pain 20 tablet 0  . PANTOPRAZOLE SODIUM 40 MG PO TBEC Oral Take 40 mg by mouth daily.        BP 130/53  Pulse 71  Temp(Src) 98.2 F (36.8 C) (Oral)  Resp 16  SpO2 99%  Physical Exam  Nursing note and vitals reviewed. Constitutional: She appears well-developed and well-nourished.  Musculoskeletal: She exhibits edema and tenderness.        Arms:   ED Course  Procedures (including critical care time)  Labs Reviewed - No data to display No results found.   1. Fracture, radius, distal       MDM  X-ray reviewed with pt and reassurred.        Barkley Bruns, MD 11/18/11 [phone]

## 2011-11-18 NOTE — ED Notes (Signed)
Pt here for re evaluation of left arm s/p distal radius fracture on 11/15/11.pt first f/u appt with [facility] on 11/29/11 but is concerned about swelling and cyanotic in fingers,tingling.pt fingers warm to touch and good circulation and denies numbness..sling  Intact with cast dressing.

## 2011-12-15 ENCOUNTER — Other Ambulatory Visit: Payer: Self-pay | Admitting: Internal Medicine

## 2011-12-15 DIAGNOSIS — Z1231 Encounter for screening mammogram for malignant neoplasm of breast: Secondary | ICD-10-CM

## 2011-12-22 ENCOUNTER — Encounter: Payer: Self-pay | Admitting: Internal Medicine

## 2011-12-27 ENCOUNTER — Ambulatory Visit (AMBULATORY_SURGERY_CENTER): Payer: Self-pay | Admitting: *Deleted

## 2011-12-27 VITALS — Ht 65.0 in | Wt 163.0 lb

## 2011-12-27 DIAGNOSIS — Z1211 Encounter for screening for malignant neoplasm of colon: Secondary | ICD-10-CM

## 2011-12-27 MED ORDER — PEG-KCL-NACL-NASULF-NA ASC-C 100 G PO SOLR: ORAL | Status: DC

## 2011-12-27 NOTE — Progress Notes (Signed)
Ms Lisa Costa had a Spanish interpreter with her for the Previsit and will need one for the day of her procedure.  Left message at [phone] for Spanish interpreter for the day of her procedure 01/10/12 at 1:30pm.  Ms Lisa Costa does speak English and reads English and with the interpreters help with medical terms verbalized understanding of the instructions for her prep.  She also has an orange card from [facility] and was given information to  Contact [place] about sliding fee to help pay for her colonoscopy.  Wyona Almas

## 2012-01-10 ENCOUNTER — Encounter: Payer: Self-pay | Admitting: Internal Medicine

## 2012-01-10 ENCOUNTER — Ambulatory Visit (AMBULATORY_SURGERY_CENTER): Payer: Self-pay | Admitting: Internal Medicine

## 2012-01-10 VITALS — BP 124/85 | HR 65 | Temp 97.2°F | Resp 20 | Ht 65.0 in | Wt 163.0 lb

## 2012-01-10 DIAGNOSIS — Z86010 Personal history of colonic polyps: Secondary | ICD-10-CM | POA: Insufficient documentation

## 2012-01-10 DIAGNOSIS — K648 Other hemorrhoids: Secondary | ICD-10-CM

## 2012-01-10 DIAGNOSIS — Z1211 Encounter for screening for malignant neoplasm of colon: Secondary | ICD-10-CM

## 2012-01-10 DIAGNOSIS — D126 Benign neoplasm of colon, unspecified: Secondary | ICD-10-CM

## 2012-01-10 MED ORDER — SODIUM CHLORIDE 0.9 % IV SOLN: 500.0000 mL | INTRAVENOUS | Status: DC

## 2012-01-10 NOTE — Patient Instructions (Signed)
FOLLOW DISCHARGE INSTRUCTIONS (BLUE AND GREEN SHEETS).. 

## 2012-01-10 NOTE — Progress Notes (Signed)
Patient did not experience any of the following events: a burn prior to discharge; a fall within the facility; wrong site/side/patient/procedure/implant event; or a hospital transfer or hospital admission upon discharge from the facility. (G8907) Patient did not have preoperative order for IV antibiotic SSI prophylaxis. (G8918)  

## 2012-01-10 NOTE — Op Note (Signed)
[facility] [address] [place], [place]  [address]  COLONOSCOPY PROCEDURE REPORT  PATIENT:  Lisa, Costa  MR#:  [id number] BIRTHDATE:  01/16/1957, 54 yrs. old  GENDER:  female ENDOSCOPIST:  Iva Boop, MD, [facility] REF. BY:  [facility] Serve, PROCEDURE DATE:  01/10/2012 PROCEDURE:  Colonoscopy with biopsy ASA CLASS:  Class II INDICATIONS:  Routine Risk Screening MEDICATIONS:   These medications were titrated to patient response per physician's verbal order, Fentanyl 50 mcg IV, Versed 8 mg IV  DESCRIPTION OF PROCEDURE:   After the risks benefits and alternatives of the procedure were thoroughly explained, informed consent was obtained.  Digital rectal exam was performed and revealed no abnormalities.   The LB [id number] endoscope was introduced through the anus and advanced to the cecum, which was identified by both the appendix and ileocecal valve, without limitations.  The quality of the prep was excellent, using MoviPrep.  The instrument was then slowly withdrawn as the colon was fully examined. <<PROCEDUREIMAGES>>  FINDINGS:  Three polyps were found. They were diminutive (<44mm). Transverse (2) and descending colon (1). The polyps were removed using cold biopsy forceps.  This was otherwise a normal examination of the colon.   Retroflexed views in the rectum revealed internal hemorrhoids.    The time to cecum = 4:39 minutes. The scope was then withdrawn in 19:31 minutes from the cecum and the procedure completed. COMPLICATIONS:  None ENDOSCOPIC IMPRESSION: 1) Three diminutive polyps removed. 2) Internal hemorrhoids 3) Otherwise normal examination, excellent prep  REPEAT EXAM:  In for Colonoscopy, pending biopsy results.  Iva Boop, MD, Clement[facility] Graham  CC[facility]  n. eSIGNED:   Iva Boop at 01/10/2012 03:08 PM  Karl Pock, [place], [phone]

## 2012-01-11 ENCOUNTER — Telehealth: Payer: Self-pay | Admitting: *Deleted

## 2012-01-11 NOTE — Telephone Encounter (Signed)
  Follow up Call-  Call back number 01/10/2012  Post procedure Call Back phone  # [phone]  Permission to leave phone message Yes     Left message for patient to call us if necessary.

## 2012-01-12 ENCOUNTER — Ambulatory Visit (HOSPITAL_COMMUNITY)
Admission: RE | Admit: 2012-01-12 | Discharge: 2012-01-12 | Disposition: A | Discharge status: Home / Self Care | Payer: Self-pay | Source: Ambulatory Visit | Attending: Internal Medicine | Admitting: Internal Medicine

## 2012-01-12 DIAGNOSIS — Z1231 Encounter for screening mammogram for malignant neoplasm of breast: Secondary | ICD-10-CM | POA: Insufficient documentation

## 2012-01-19 ENCOUNTER — Encounter: Payer: Self-pay | Admitting: Internal Medicine

## 2012-01-19 NOTE — Progress Notes (Signed)
Quick Note:  One adenoma  Repeat colonoscopy about 12/2016 ______

## 2012-01-24 ENCOUNTER — Emergency Department (INDEPENDENT_AMBULATORY_CARE_PROVIDER_SITE_OTHER): Payer: Self-pay

## 2012-01-24 ENCOUNTER — Encounter (HOSPITAL_COMMUNITY): Payer: Self-pay

## 2012-01-24 ENCOUNTER — Emergency Department (INDEPENDENT_AMBULATORY_CARE_PROVIDER_SITE_OTHER)
Admission: EM | Admit: 2012-01-24 | Discharge: 2012-01-24 | Disposition: A | Discharge status: Home Health | Payer: Self-pay | Source: Home / Self Care | Attending: Emergency Medicine | Admitting: Emergency Medicine

## 2012-01-24 DIAGNOSIS — M25539 Pain in unspecified wrist: Secondary | ICD-10-CM

## 2012-01-24 DIAGNOSIS — G8929 Other chronic pain: Secondary | ICD-10-CM

## 2012-01-24 HISTORY — DX: Displaced fracture of head of left radius, initial encounter for closed fracture: S52.122A

## 2012-01-24 MED ORDER — METHYLPREDNISOLONE 4 MG PO KIT: PACK | ORAL | Status: AC

## 2012-01-24 MED ORDER — IBUPROFEN 600 MG PO TABS: 600.0000 mg | ORAL_TABLET | Freq: Four times a day (QID) | ORAL | Status: AC | PRN

## 2012-01-24 NOTE — ED Notes (Addendum)
Pt seen here in December 2012 for lt distal radial fracture.  States cast removed 12/06/11, states 3 weeks ago she started exercising it and began having swelling to ulnar aspect of lt wrist and pain shooting up that side of her wrist and forearm.  Denies re-injury or fall.

## 2012-01-24 NOTE — ED Provider Notes (Signed)
History     CSN: [id number]  Arrival date & time 01/24/12  4098   First MD Initiated Contact with Patient 01/24/12 1103      Chief Complaint  Patient presents with  . Wrist Pain    (Consider location/radiation/quality/duration/timing/severity/associated sxs/prior treatment) HPI Comments: Patient is a right-handed female status post left distal radial fracture in summer 2002 presents with left hand swelling, pain along the APB, and ulnar wrist pain with supination starting approximately 3 weeks ago after she returned to full use of her hand. Patient was evaluated by a hand specialist, placed in a cast, and then moved to a wrist splint. She was to followup with him, but patient was unable to afford repeat visit. She wore the splint as directed, and has been doing physical therapy on her own. However, patient is concerned about hand swelling. No hand tingling, numbness, weakness. No bruising, rash. No new injury. She reports some decreased range of motion after being in a splint, but is still able to move her wrist. Patient has not tried any medications for her current symptoms.  Patient is a 61 y.o. female presenting with wrist pain. The history is provided by the patient. No language interpreter was used.  Wrist Pain This is a chronic problem. The current episode started more than 1 week ago. The problem occurs constantly. The problem has not changed since onset.   Past Medical History  Diagnosis Date  . Hypercholesteremia   . GERD (gastroesophageal reflux disease)   . Osteoporosis   . Heart murmur   . Thyroid disease 2010    Positive skin test/negative chest xray  . Left radial head fracture     Past Surgical History  Procedure Date  . Cesarean section     3 births    History reviewed. No pertinent family history.  History  Substance Use Topics  . Smoking status: Former Games developer  . Smokeless tobacco: Never Used  . Alcohol Use: 1.0 oz/week    2 drink(s) per week   occational    OB History    Grav Para Term Preterm Abortions TAB SAB Ect Mult Living                  Review of Systems  Allergies  Tramadol  Home Medications   Current Outpatient Rx  Name Route Sig Dispense Refill  . ASPIRIN-ACETAMINOPHEN-CAFFEINE 250-250-65 MG PO TABS Oral Take 2 tablets by mouth every 6 (six) hours as needed.    . ATORVASTATIN CALCIUM 40 MG PO TABS Oral Take 40 mg by mouth daily.      . B COMPLEX PO TABS Oral Take 1 tablet by mouth daily.      Marland Kitchen CALCIUM-VITAMIN D 250-125 MG-UNIT PO TABS Oral Take 1 tablet by mouth daily.      . IBUPROFEN 600 MG PO TABS Oral Take 1 tablet (600 mg total) by mouth every 6 (six) hours as needed for pain. 30 tablet 0  . METHYLPREDNISOLONE 4 MG PO KIT  follow package directions 21 tablet 0  . PANTOPRAZOLE SODIUM 40 MG PO TBEC Oral Take 40 mg by mouth daily.        BP 117/55  Pulse 74  Temp(Src) 98.4 F (36.9 C) (Oral)  Resp 16  SpO2 96%  Physical Exam  Nursing note and vitals reviewed. Constitutional: She is oriented to person, place, and time. She appears well-developed and well-nourished. No distress.  HENT:  Head: Normocephalic and atraumatic.  Eyes: Conjunctivae and EOM are normal.  Neck: Normal range of motion.  Cardiovascular: Normal rate.   Pulmonary/Chest: Effort normal.  Abdominal: She exhibits no distension.  Musculoskeletal: Normal range of motion.       L distal radius NT, distal ulnar styloid tender, snuffbox NT, carpals NT, metacarpals NT, digits NT, Motor intact ability to flex / extend digits of affected hand, Sensation LT to hand normal, CR<2 seconds distally. Decreased range of motion with flexion extension, radial and ulnar deviation compared to the right wrist. Pain with ulnar deviation. Tenderness along APL/EPB.Marland Kitchen Positive Finkelstein's. Normal AROM of thumb. Elbow and proximal forearm NT on affected extremity.  Neurological: She is alert and oriented to person, place, and time.  Skin: Skin is warm and  dry.  Psychiatric: She has a normal mood and affect. Her behavior is normal. Judgment and thought content normal.    ED Course  Procedures (including critical care time)  Labs Reviewed - No data to display Dg Wrist Complete Left  01/24/2012  *RADIOLOGY REPORT*  Clinical Data: Follow-up wrist fracture.  LEFT WRIST - COMPLETE 3+ VIEW  Comparison: 11/15/2011.  Findings: The distal radius fracture is healed.  No complicating features are identified.  Moderate osteoporosis.  IMPRESSION: Healed distal radius fracture.  No acute findings.  Original Report Authenticated By: P. Loralie Champagne, M.D.     1. Wrist pain, chronic     Imaging reviewed by myself. Report per radiologist. Dg Wrist Complete Left  01/24/2012  *RADIOLOGY REPORT*  Clinical Data: Follow-up wrist fracture.  LEFT WRIST - COMPLETE 3+ VIEW  Comparison: 11/15/2011.  Findings: The distal radius fracture is healed.  No complicating features are identified.  Moderate osteoporosis.  IMPRESSION: Healed distal radius fracture.  No acute findings.  Original Report Authenticated By: P. Loralie Champagne, M.D.     MDM  Will check wrist x-ray to see how her wrist has healed and to rule out any new injury. Limited range of motion of her wrist most likely secondary to prolonged immobilization. Otherwise has good strength. Has mild swelling of her hand, and some tenderness suggestive of de Quervain's tenosynovitis. Will send home with anti-inflammatories, relative rest, have her restart wearing her wrist splint, and short course of steroids. Will have her followup with Dr. Izora Ribas, who originally evaluated her, or with the [facility] for rehabilitation.   Luiz Blare, MD 01/26/12 0001

## 2012-01-24 NOTE — Discharge Instructions (Signed)
You do not have any acute injury today. I suspect that the swelling is from overuse, and that the wrist pain is from being immobilized for prolonged period of time. You need to return to activity gradually. You may ice, elevate your hand and wrist, and continue wear the splint. You would also benefit from some physical therapy. You may followup either with Dr. Izora Ribas or with the [facility] for this. Take medication as written. Return if you've a fever of 100.4, if you get worse, or for any other concerns.

## 2012-02-21 ENCOUNTER — Ambulatory Visit: Payer: Self-pay | Attending: Internal Medicine | Admitting: Occupational Therapy

## 2012-02-21 ENCOUNTER — Ambulatory Visit (HOSPITAL_COMMUNITY)
Admission: RE | Admit: 2012-02-21 | Discharge: 2012-02-21 | Disposition: A | Discharge status: Home / Self Care | Payer: Self-pay | Source: Ambulatory Visit | Attending: Internal Medicine | Admitting: Internal Medicine

## 2012-02-21 ENCOUNTER — Other Ambulatory Visit: Payer: Self-pay | Admitting: Internal Medicine

## 2012-02-21 DIAGNOSIS — R911 Solitary pulmonary nodule: Secondary | ICD-10-CM | POA: Insufficient documentation

## 2012-02-21 DIAGNOSIS — M25549 Pain in joints of unspecified hand: Secondary | ICD-10-CM | POA: Insufficient documentation

## 2012-02-21 DIAGNOSIS — M25649 Stiffness of unspecified hand, not elsewhere classified: Secondary | ICD-10-CM | POA: Insufficient documentation

## 2012-02-21 DIAGNOSIS — M6281 Muscle weakness (generalized): Secondary | ICD-10-CM | POA: Insufficient documentation

## 2012-02-21 DIAGNOSIS — IMO0001 Reserved for inherently not codable concepts without codable children: Secondary | ICD-10-CM | POA: Insufficient documentation

## 2012-02-21 DIAGNOSIS — R7611 Nonspecific reaction to tuberculin skin test without active tuberculosis: Secondary | ICD-10-CM | POA: Insufficient documentation

## 2012-02-23 ENCOUNTER — Ambulatory Visit: Payer: Self-pay | Admitting: Occupational Therapy

## 2012-02-28 ENCOUNTER — Ambulatory Visit: Payer: Self-pay | Attending: Internal Medicine | Admitting: Occupational Therapy

## 2012-02-28 DIAGNOSIS — M6281 Muscle weakness (generalized): Secondary | ICD-10-CM | POA: Insufficient documentation

## 2012-02-28 DIAGNOSIS — M25649 Stiffness of unspecified hand, not elsewhere classified: Secondary | ICD-10-CM | POA: Insufficient documentation

## 2012-02-28 DIAGNOSIS — IMO0001 Reserved for inherently not codable concepts without codable children: Secondary | ICD-10-CM | POA: Insufficient documentation

## 2012-02-28 DIAGNOSIS — M25549 Pain in joints of unspecified hand: Secondary | ICD-10-CM | POA: Insufficient documentation

## 2012-03-08 ENCOUNTER — Ambulatory Visit: Payer: Self-pay | Admitting: Occupational Therapy

## 2012-03-10 ENCOUNTER — Ambulatory Visit: Payer: Self-pay | Admitting: Occupational Therapy

## 2012-03-15 ENCOUNTER — Ambulatory Visit: Payer: Self-pay | Admitting: Occupational Therapy

## 2012-03-17 ENCOUNTER — Ambulatory Visit: Payer: Self-pay | Admitting: Occupational Therapy

## 2012-03-22 ENCOUNTER — Ambulatory Visit: Payer: Self-pay | Admitting: Occupational Therapy

## 2012-03-24 ENCOUNTER — Encounter: Payer: Self-pay | Admitting: Occupational Therapy

## 2012-03-27 ENCOUNTER — Ambulatory Visit: Payer: Self-pay | Admitting: Occupational Therapy

## 2012-03-29 ENCOUNTER — Ambulatory Visit: Payer: Self-pay | Attending: Internal Medicine | Admitting: Occupational Therapy

## 2012-03-29 DIAGNOSIS — M6281 Muscle weakness (generalized): Secondary | ICD-10-CM | POA: Insufficient documentation

## 2012-03-29 DIAGNOSIS — M25649 Stiffness of unspecified hand, not elsewhere classified: Secondary | ICD-10-CM | POA: Insufficient documentation

## 2012-03-29 DIAGNOSIS — M25549 Pain in joints of unspecified hand: Secondary | ICD-10-CM | POA: Insufficient documentation

## 2012-03-29 DIAGNOSIS — IMO0001 Reserved for inherently not codable concepts without codable children: Secondary | ICD-10-CM | POA: Insufficient documentation

## 2012-04-04 ENCOUNTER — Ambulatory Visit: Payer: Self-pay | Admitting: Occupational Therapy

## 2012-04-07 ENCOUNTER — Ambulatory Visit: Payer: Self-pay | Admitting: Occupational Therapy

## 2012-04-10 ENCOUNTER — Ambulatory Visit: Payer: Self-pay | Admitting: Occupational Therapy

## 2012-04-11 ENCOUNTER — Other Ambulatory Visit: Payer: Self-pay | Admitting: Internal Medicine

## 2012-04-11 ENCOUNTER — Other Ambulatory Visit (HOSPITAL_COMMUNITY)
Admission: RE | Admit: 2012-04-11 | Discharge: 2012-04-11 | Disposition: A | Discharge status: Home / Self Care | Payer: Self-pay | Source: Ambulatory Visit | Attending: Internal Medicine | Admitting: Internal Medicine

## 2012-04-11 DIAGNOSIS — Z01419 Encounter for gynecological examination (general) (routine) without abnormal findings: Secondary | ICD-10-CM | POA: Insufficient documentation

## 2012-04-12 ENCOUNTER — Ambulatory Visit: Payer: Self-pay | Admitting: Occupational Therapy

## 2013-01-15 ENCOUNTER — Ambulatory Visit (HOSPITAL_COMMUNITY)
Admission: RE | Admit: 2013-01-15 | Discharge: 2013-01-15 | Disposition: A | Discharge status: Home / Self Care | Payer: Self-pay | Source: Ambulatory Visit | Attending: Family Medicine | Admitting: Family Medicine

## 2013-01-15 ENCOUNTER — Other Ambulatory Visit (HOSPITAL_COMMUNITY): Payer: Self-pay | Admitting: Family Medicine

## 2013-01-15 DIAGNOSIS — R7611 Nonspecific reaction to tuberculin skin test without active tuberculosis: Secondary | ICD-10-CM

## 2013-09-14 ENCOUNTER — Other Ambulatory Visit (HOSPITAL_COMMUNITY): Payer: Self-pay | Admitting: Internal Medicine

## 2013-09-14 ENCOUNTER — Ambulatory Visit (HOSPITAL_COMMUNITY)
Admission: RE | Admit: 2013-09-14 | Discharge: 2013-09-14 | Disposition: A | Discharge status: Home / Self Care | Payer: Self-pay | Source: Ambulatory Visit | Attending: Internal Medicine | Admitting: Internal Medicine

## 2013-09-14 DIAGNOSIS — R52 Pain, unspecified: Secondary | ICD-10-CM

## 2013-09-14 DIAGNOSIS — R7611 Nonspecific reaction to tuberculin skin test without active tuberculosis: Secondary | ICD-10-CM | POA: Insufficient documentation

## 2014-02-06 ENCOUNTER — Other Ambulatory Visit (HOSPITAL_COMMUNITY): Payer: Self-pay | Admitting: Nurse Practitioner

## 2014-02-06 DIAGNOSIS — Z1231 Encounter for screening mammogram for malignant neoplasm of breast: Secondary | ICD-10-CM

## 2014-02-15 ENCOUNTER — Ambulatory Visit (HOSPITAL_COMMUNITY): Payer: Self-pay

## 2014-02-22 ENCOUNTER — Ambulatory Visit (HOSPITAL_COMMUNITY)
Admission: RE | Admit: 2014-02-22 | Discharge: 2014-02-22 | Disposition: A | Discharge status: Home / Self Care | Payer: Self-pay | Source: Ambulatory Visit | Attending: Nurse Practitioner | Admitting: Nurse Practitioner

## 2014-02-22 DIAGNOSIS — Z1231 Encounter for screening mammogram for malignant neoplasm of breast: Secondary | ICD-10-CM

## 2014-05-15 ENCOUNTER — Encounter: Payer: Self-pay | Admitting: *Deleted

## 2014-06-10 ENCOUNTER — Encounter: Payer: Self-pay | Admitting: Obstetrics & Gynecology

## 2014-06-10 ENCOUNTER — Ambulatory Visit (INDEPENDENT_AMBULATORY_CARE_PROVIDER_SITE_OTHER): Payer: Self-pay | Admitting: Obstetrics & Gynecology

## 2014-06-10 VITALS — BP 118/70 | HR 77 | Temp 97.4°F | Ht 65.0 in | Wt 151.1 lb

## 2014-06-10 DIAGNOSIS — I863 Vulval varices: Secondary | ICD-10-CM

## 2014-06-10 NOTE — Patient Instructions (Signed)
Venas varicosas sangrantes  (Bleeding Varicose Veins) Las venas varicosas son venas que se han agrandado y se tornan sinuosas. Las vlvulas de las venas ayudan al retorno de la sangre desde las piernas hacia el corazn. Si estas vlvulas sufren una lesin, el flujo sanguneo retorna hacia atrs y [employer] venas de la pierna, cerca de la piel. Esto hace que las venas se agranden debido al aumento de la presin interior. En algunos casos las venas sangran.  CAUSAS  Los factores que pueden causar sangrado en las venas varicosas son:   Adelgazamiento de la piel que cubre las venas. Esta piel se estira a medida que las venas se agrandan.  Debilidad y adelgazamiento de las paredes de las venas varicosas. Estas paredes delgadas son Ardelia Mems de las causas por las que la sangre no fluye normalmente hacia corazn.  Presin alta en las venas. La alta presin se debe a que la sangre no fluye libremente Optician, dispensing.  Traumatismos. Incluso una pequea lesin en una vena varicosa puede producir sangrado.  Heridas abiertas. En la zona de una vena varicosa puede formarse una lcera y no curarse. Esto favorece el sangrado.  Tomar anticoagulantes. Entre ellos se incluye la aspirina, los medicamentos antiinflamatorios y Occupational hygienist. SNTOMAS  Si el sangrado ocurre en la superficie exterior de la piel, la sangre puede verse. A veces, el sangrado ocurre debajo de la piel. En este caso, podr observarse una zona azul o prpura que se extender ms all de la vena. Este cambio del color puede ser visible.  DIAGNSTICO  Para diagnosticar una vena varicosa que sangra, el mdico:   Preguntar acerca de sus sntomas. Cundo fue la primera Oncologist.  Durante cunto tiempo ha tenido vrices y Environmental manager causan problemas.  Preguntar acerca de su salud en general.  Tambin sobre las posibles causas, como cortes recientes o si se ha golpeado o lastimado cerca de las venas varicosas.  Examinar la  piel o la pierna que le preocupa. [place].  Solicitar estudios de diagnstico por imgenes. Con este estudio podr observarse una imagen detallada de las venas. TRATAMIENTO  El Psychologist, forensic del tratamiento de las varices que sangran es detener el sangrado. El objetivo es impedir que el sangrado vuelva a Restaurant manager, fast food. El tratamiento depender de la causa de la hemorragia y su gravedad. Pregunte a su mdico qu sera lo mejor para usted. Las opciones incluyen:   Lexicographer (elevar) la pierna. Acostarse con la pierna apoyada en una almohada o cojn. El pie debe estar por encima del nivel del corazn.  Aplicar presin en la zona que est sangrando. El sangrado debe detenerse en un corto tiempo.  El uso de medias elsticas para "comprimir" las piernas (medias de compresin). Una venda elstica podra tener el mismo [place].  Aplicacin de una crema antibitica en las lceras que no cicatrizan.  La eliminacin [place] o cierre de las venas varicosas que sangran. INSTRUCCIONES PARA EL CUIDADO EN EL HOGAR   Aplique las cremas que su mdico le haya recetado. [place].  Use las medias de compresin o cualquier vendaje especial que se le indique. Asegrese de saber:  Si debe usarlas todos los [place].  Durante cunto tiempo tendr [employer].  Si las venas se eliminaron o se cerraron, tendr un vendaje (apsito ) cubriendo la zona. Asegrese de saber:  Con qu frecuencia debe cambiar el apsito.  Si la zona se puede mojar.  Cuando usted puede dejar la piel al  descubierto.  Revise la [employer]. Observe si aparecen nuevas llagas y signos de sangrado.  Para prevenir futuras hemorragias:  Tenga especial cuidado en situaciones en las que se podra lastimar las piernas. Por ejemplo, durante la depilacin, o si trabaja en el exterior o en el jardn.  Trate de [employer] piernas elevadas el mayor tiempo posible. Recustese siempre que  pueda. SOLICITE ATENCIN MDICA SI:   Usted tiene alguna duda sobre cmo usar medias de compresin o las vendas elsticas.  Las venas continan sangrando.  Aparecen lceras cerca de las venas varicosas.  Tiene una lcera que no se [place] o se agranda.  El dolor en la pierna [place].  El rea alrededor de la vena varicosa est   caliente, roja o sensible al tacto.  Supura un lquido amarillento que huele mal en el lugar donde estaba sangrando.  La temperatura eleva a ms de 100.5 F (38.1 C). SOLICITE ATENCIN MDICA DE INMEDIATO SI:   La temperatura eleva a ms de 102 F (38,9 C). Document Released: 08/09/2012 [facility] Patient Information 2015 [place]. This information is not intended to replace advice given to you by your health care provider. Make sure you discuss any questions you have with your health care provider. Venas varicosas (Varicose Veins) Las venas varicosas son venas que se han agrandado y se tornan sinuosas.  CAUSAS  Este trastorno es el resultado de un malfuncionamiento de las vlvulas de las venas. Las vlvulas de las venas ayudan al retorno de la sangre desde las piernas hacia el corazn. Si estas vlvulas se daan, el flujo sanguneo retorna hacia atrs y [employer] venas de la pierna, cerca de la piel. Esto hace que las venas se agranden debido al aumento de la presin en su interior. Es ms probable que desarrollen venas varicosas las personas que estn de pie durante mucho [place], las mujeres embarazadas o quienes tienen sobrepeso. SNTOMAS   Venas hinchadas, tortuosas, y [place], que se observan ms comnmente en las piernas.  Dolor en [place]. Estos sntomas pueden empeorar hacia el final del da.  [place].  Cambios en el color de la piel. DIAGNSTICO  Su mdico puede diagnosticarle venas varicosas con un examen de las piernas. Podr recomendarle una ecografa de las venas de sus piernas.   TRATAMIENTO  La mayora de las venas varicosas pueden tratarse en casa.Sin embargo, existen otros tratamientos para personas con sntomas persistentes o que desean tratar la apariencia de las venas varicosas. Entre estos tratamientos se incluyen:   Tour manager de venas varicosas muy pequeas.  Medicamentos que se inyectan en la vena. Este medicamento endurece las paredes de la vena y Engineer, structural. Se denomina escleroterapia. Luego, deber [employer] ropa o vendajes que apliquen presin.  [place]. INSTRUCCIONES PARA EL CUIDADO DOMICILIARIO  No permanezca sentado o de pie en una posicin durante largos perodos. No se siente con las piernas cruzadas. Descanse con [place].  Use medias elsticas o de soporte. No use prendas apretadas alrededor de las piernas, pelvis o cintura.  Camine todo lo posible para aumentar el flujo de sangre.  Levante los pies de la cama por la noche, alrededor de 5 cm.  Si se ha cortado la piel que est sobre la vena y [place], recustese con la pierna elevada y presione con un pao limpio hasta que la sangre se [place]. Luego coloque una venda sobre el corte. Consulte a su mdico si contina  sangrando o necesita una sutura. SOLICITE ATENCIN MDICA SI:  La piel que rodea el tobillo comienza a lesionarse.  Presenta dolor, enrojecimiento, sensibilidad o hinchazn en la pierna, sobre la vena.  Siente molestias y Aeronautical engineer pierna. Document Released: 08/25/2005 Document Revised: 02/07/2012 [facility] Patient Information 2015 [place]. This information is not intended to replace advice given to you by your health care provider. Make sure you discuss any questions you have with your health care provider.

## 2014-06-10 NOTE — Progress Notes (Signed)
Subjective:     [name], female   DOB: 10/09/1951, 63 y.o.   MRN: [id number]  HPI Pt reports a mass that comes and goes on her vulva.  She reports that she was told that she could get a cream and they would go away.  She says that the lumps sometimes get big and 'pop' or bleed.  She denies pain.    Review of Systems     Objective:   Physical Exam BP 118/70  Pulse 77  Temp(Src) 97.4 F (36.3 C) (Oral)  Ht 5\' 5"  (1.651 m)  Wt 151 lb 1.6 oz (68.539 kg)  BMI 25.14 kg/m2 GU: EGBUS: varicose veins on left side. Not thrombosed at present   Vagina: no blood in vault Cervix: no lesion; PAP NOT done         Assessment:     Vulvar varicose veins     Plan:     Compression hose prn Pt given info to call for free PAP

## 2014-09-30 ENCOUNTER — Encounter: Payer: Self-pay | Admitting: Obstetrics & Gynecology

## 2014-12-30 ENCOUNTER — Ambulatory Visit (INDEPENDENT_AMBULATORY_CARE_PROVIDER_SITE_OTHER): Payer: No Typology Code available for payment source | Admitting: Family Medicine

## 2014-12-30 VITALS — BP 128/86 | HR 74 | Temp 98.2°F | Resp 16 | Ht 65.0 in | Wt 158.2 lb

## 2014-12-30 DIAGNOSIS — H8112 Benign paroxysmal vertigo, left ear: Secondary | ICD-10-CM

## 2014-12-30 DIAGNOSIS — H8111 Benign paroxysmal vertigo, right ear: Secondary | ICD-10-CM

## 2014-12-30 LAB — CBC
HCT: 41.2 % (ref 36.0–46.0)
HEMOGLOBIN: 14.1 g/dL (ref 12.0–15.0)
MCH: 31.6 pg (ref 26.0–34.0)
MCHC: 34.2 g/dL (ref 30.0–36.0)
MCV: 92.4 fL (ref 78.0–100.0)
MPV: 10.3 fL (ref 8.6–12.4)
Platelets: 284 10*3/uL (ref 150–400)
RBC: 4.46 MIL/uL (ref 3.87–5.11)
RDW: 13.2 % (ref 11.5–15.5)
WBC: 6 10*3/uL (ref 4.0–10.5)

## 2014-12-30 MED ORDER — MECLIZINE HCL 12.5 MG PO TABS: 12.5000 mg | ORAL_TABLET | Freq: Three times a day (TID) | ORAL | Status: DC | PRN

## 2014-12-30 NOTE — Patient Instructions (Addendum)
I think that you have a type of vertigo called "BPPV."  This is benign and will likely go away with a few more days Use the antivert medication as needed- remember this can make you feel sleepy! Do not drive while taking this medication Call me if you are getting worse

## 2014-12-30 NOTE — Progress Notes (Signed)
[facility] [address], [place] [address] [phone]  Date:  12/30/2014   Name:  Lisa Costa   DOB:  August 22, 1951   MRN:  [id number]  PCP:  Kevan Ny, MD    Chief Complaint: Dizziness   History of Present Illness:  Lisa Costa is a 64 y.o. very pleasant female patient who presents with the following:  Here today as a new patient with complaint of dizziness.  She is a Secretary/administrator.  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 This past Friday night (today is Monday) she stayed with him overnight and was in and out of bed frequently.  Around 0500 on Saturday am she noted onset of vertigo when she turned over in bed.  As long as she holds still she is ok, but she feels a little off balance overall.  The vertigo has gotten better since its onset but is still there.  She has never had this in the past.  She otherwise feels ok She does have a mild headache today; a typical HA for her.  She has noted occasional buzz in her ears, but this is not persistent. She does wear hearing aids.   No vomiting.    Patient Active Problem List   Diagnosis Date Noted  . ALLERGIC RHINITIS 08/27/2010  . STRESS INCONTINENCE 12/22/2009  . HYPERCHOLESTEROLEMIA 03/24/2009  . LUNG NODULE 01/03/2009  . TB SKIN TEST, POSITIVE 01/02/2009  . MICROSCOPIC HEMATURIA 12/31/2008  . VAGINAL MASS 12/05/2008  . ANXIETY DEPRESSION 07/22/2008  . OSTEOPENIA 05/07/2008  . DECREASED HEARING 05/06/2008  . DENTAL CARIES 10/03/2007  . GERD 10/03/2007    Past Medical History  Diagnosis Date  . Hypercholesteremia   . GERD (gastroesophageal reflux disease)   . Osteoporosis   . Heart murmur   . Thyroid disease 2010    Positive skin test/negative chest xray  . Left radial head fracture     Past Surgical History  Procedure Laterality Date  . Cesarean section      3 births    History  Substance Use Topics  . Smoking status: Former Research scientist (life sciences)  .  Smokeless tobacco: Never Used  . Alcohol Use: 1.0 oz/week    2 drink(s) per week     Comment: occational    Family History  Problem Relation Age of Onset  . Heart disease Mother     Allergies  Allergen Reactions  . Tramadol Nausea And Vomiting    Medication list has been reviewed and updated.  Current Outpatient Prescriptions on File Prior to Visit  Medication Sig Dispense Refill  . aspirin-acetaminophen-caffeine (EXCEDRIN MIGRAINE) 250-250-65 MG per tablet Take 2 tablets by mouth every 6 (six) hours as needed.    . calcium-vitamin D (OSCAL) 250-125 MG-UNIT per tablet Take 1 tablet by mouth daily.      . pantoprazole (PROTONIX) 40 MG tablet Take 40 mg by mouth daily.      . pravastatin (PRAVACHOL) 40 MG tablet Take 40 mg by mouth daily.    Marland Kitchen b complex vitamins tablet Take 1 tablet by mouth daily.       No current facility-administered medications on file prior to visit.    Review of Systems:  As per HPI- otherwise negative.   Physical Examination: Filed Vitals:   12/30/14 1315  BP: 128/86  Pulse: 74  Temp: 98.2 F (36.8 C)  Resp: 16   Filed Vitals:   12/30/14 1315  Height: 5\' 5"  (1.651 m)  Weight: 158 lb 3.2 oz (71.759 kg)   Body mass index is 26.33 kg/(m^2). Ideal Body Weight: Weight in (lb) to have BMI = 25: 149.9  GEN: WDWN, NAD, Non-toxic, A & O x 3, looks well HEENT: Atraumatic, Normocephalic. Neck supple. No masses, No LAD.  Bilateral TM wnl, oropharynx normal.  PEERL,EOMI.   Ears and Nose: No external deformity. CV: RRR, No M/G/R. No JVD. No thrill. No extra heart sounds. PULM: CTA B, no wheezes, crackles, rhonchi. No retractions. No resp. distress. No accessory muscle use. ABD: S, NT, ND. No rebound. No HSM. EXTR: No c/c/e NEURO Normal gait.  PSYCH: Normally interactive. Conversant. Not depressed or anxious appearing.  Calm demeanor.  Neuro: Positive dix- halpike to the right  Neuro exam is OW benign and normal; normal strength, sensation and DTR  all extremities, negative Romberg  Assessment and Plan: Benign paroxysmal positional vertigo, right - Plan: CBC, Comprehensive metabolic panel, meclizine (ANTIVERT) 12.5 MG tablet  BPPV (benign paroxysmal positional vertigo), left - Plan: CBC, Comprehensive metabolic panel, meclizine (ANTIVERT) 12.5 MG tablet  Vertigo that began upon rolling over in bed- likely BPPV. Discussed BPPV and treatment with pt.  She will use meclizine as needed, will let me know if not improving as the week goes on- Sooner if worse.   Await labs and will follow-up with her.    Signed Lamar Blinks, MD

## 2014-12-31 ENCOUNTER — Telehealth: Payer: Self-pay

## 2014-12-31 ENCOUNTER — Encounter: Payer: Self-pay | Admitting: Family Medicine

## 2014-12-31 LAB — COMPREHENSIVE METABOLIC PANEL
ALBUMIN: 4.5 g/dL (ref 3.5–5.2)
ALT: 22 U/L (ref 0–35)
AST: 18 U/L (ref 0–37)
Alkaline Phosphatase: 71 U/L (ref 39–117)
BUN: 15 mg/dL (ref 6–23)
CALCIUM: 9.4 mg/dL (ref 8.4–10.5)
CO2: 23 meq/L (ref 19–32)
Chloride: 104 mEq/L (ref 96–112)
Creat: 0.66 mg/dL (ref 0.50–1.10)
GLUCOSE: 77 mg/dL (ref 70–99)
Potassium: 4.3 mEq/L (ref 3.5–5.3)
SODIUM: 140 meq/L (ref 135–145)
Total Bilirubin: 0.4 mg/dL (ref 0.2–1.2)
Total Protein: 6.9 g/dL (ref 6.0–8.3)

## 2014-12-31 NOTE — Telephone Encounter (Signed)
Patient saw Dr. Lorelei Pont yesterday,  She is feeling worse today with a "new" headache  [phone]

## 2015-01-01 NOTE — Telephone Encounter (Signed)
Spoke with pt, she states she feels really bad and even more dizzy. She states she is ok now but is wondering if Dr. Lorelei Pont can Rx an antibiotic. It was hard to understand pt over the phone. I believe she still thinks she needs the ABX.

## 2015-01-01 NOTE — Telephone Encounter (Signed)
Called and was able to reach her.  Advised that her labs were normal.  She was concerned that her dizziness could be due to either high cholesterol or an infection.  Advised that I did not think these are likely causes, and that she seemed to have BPPV at our visit.  Asked her to come back and see [place] if not feeling better soon and she agreed

## 2015-01-01 NOTE — Telephone Encounter (Signed)
Called but had to lmom.  I am not quite sure from the message what her current concern is, but I do not think abx will help.  Please come and see [place] for a recheck if not better

## 2015-01-31 ENCOUNTER — Ambulatory Visit: Payer: No Typology Code available for payment source | Admitting: Family Medicine

## 2015-05-13 ENCOUNTER — Ambulatory Visit (INDEPENDENT_AMBULATORY_CARE_PROVIDER_SITE_OTHER): Payer: No Typology Code available for payment source

## 2015-05-13 ENCOUNTER — Ambulatory Visit (INDEPENDENT_AMBULATORY_CARE_PROVIDER_SITE_OTHER): Payer: No Typology Code available for payment source | Admitting: Family Medicine

## 2015-05-13 VITALS — BP 130/76 | HR 68 | Temp 98.7°F | Resp 17 | Ht 65.0 in | Wt 161.6 lb

## 2015-05-13 DIAGNOSIS — M25522 Pain in left elbow: Secondary | ICD-10-CM

## 2015-05-13 DIAGNOSIS — S52122A Displaced fracture of head of left radius, initial encounter for closed fracture: Secondary | ICD-10-CM

## 2015-05-13 MED ORDER — HYDROCODONE-ACETAMINOPHEN 5-325 MG PO TABS: 1.0000 | ORAL_TABLET | Freq: Three times a day (TID) | ORAL | Status: DC | PRN

## 2015-05-13 NOTE — Patient Instructions (Addendum)
I am going to refer you to see an orthopedist within the next few days to check on your elbow.   Wear the splint and sling for comfort- however as soon as you are able you can work on moving your forearm over and back as  Ishowed you. Please let me know if you do not hear about your upcoming orthopedics appointment  We will give you a call with your appointment information  Use ibuprofen as needed, and you can use the hydrocodone if needed for more severe pain

## 2015-05-13 NOTE — Progress Notes (Signed)
[facility] [address], [place]  [address] [phone]  Date:  05/13/2015   Name:  Lisa Costa   DOB:  Feb 19, 1951   MRN:  [id number]  PCP:  Kevan Ny, MD    Chief Complaint: Elbow Injury   History of Present Illness:  Lisa Costa is a 64 y.o. very pleasant female patient who presents with the following:  Two days ago she fell and landed on her left wrist and left elbow.  She cannot fully extend the elbow and cannot supinate/ pronate without a lot of pain.  She is OW Designer, industrial/product.  No other issue today Her wrist is a bit sore but her main concern is her elbow  She reports a history of nausea and vomiting with tramadol but not angioedema, hives, swelling, etc  Patient Active Problem List   Diagnosis Date Noted  . ALLERGIC RHINITIS 08/27/2010  . STRESS INCONTINENCE 12/22/2009  . HYPERCHOLESTEROLEMIA 03/24/2009  . LUNG NODULE 01/03/2009  . TB SKIN TEST, POSITIVE 01/02/2009  . MICROSCOPIC HEMATURIA 12/31/2008  . VAGINAL MASS 12/05/2008  . ANXIETY DEPRESSION 07/22/2008  . OSTEOPENIA 05/07/2008  . DECREASED HEARING 05/06/2008  . DENTAL CARIES 10/03/2007  . GERD 10/03/2007    Past Medical History  Diagnosis Date  . Hypercholesteremia   . GERD (gastroesophageal reflux disease)   . Osteoporosis   . Heart murmur   . Thyroid disease 2010    Positive skin test/negative chest xray  . Left radial head fracture     Past Surgical History  Procedure Laterality Date  . Cesarean section      3 births    History  Substance Use Topics  . Smoking status: Former Research scientist (life sciences)  . Smokeless tobacco: Never Used  . Alcohol Use: 1.0 oz/week    2 drink(s) per week     Comment: occational    Family History  Problem Relation Age of Onset  . Heart disease Mother     Allergies  Allergen Reactions  . Tramadol Nausea And Vomiting    Medication list has been reviewed and updated.  Current Outpatient Prescriptions on File Prior to Visit   Medication Sig Dispense Refill  . aspirin-acetaminophen-caffeine (EXCEDRIN MIGRAINE) 250-250-65 MG per tablet Take 2 tablets by mouth every 6 (six) hours as needed.    . calcium-vitamin D (OSCAL) 250-125 MG-UNIT per tablet Take 1 tablet by mouth daily.      . meclizine (ANTIVERT) 12.5 MG tablet Take 1 tablet (12.5 mg total) by mouth 3 (three) times daily as needed for dizziness. 30 tablet 0  . pantoprazole (PROTONIX) 40 MG tablet Take 40 mg by mouth daily.      . pravastatin (PRAVACHOL) 40 MG tablet Take 40 mg by mouth daily.    Marland Kitchen b complex vitamins tablet Take 1 tablet by mouth daily.       No current facility-administered medications on file prior to visit.    Review of Systems:  As per HPI- otherwise negative.   Physical Examination: Filed Vitals:   05/13/15 1052  BP: 130/76  Pulse: 68  Temp: 98.7 F (37.1 C)  Resp: 17   Filed Vitals:   05/13/15 1052  Height: 5\' 5"  (1.651 m)  Weight: 161 lb 9.6 oz (73.301 kg)   Body mass index is 26.89 kg/(m^2). Ideal Body Weight: Weight in (lb) to have BMI = 25: 149.9  GEN: WDWN, NAD, Non-toxic, A & O x 3, looks well HEENT: Atraumatic, Normocephalic. Neck supple. No masses, No  LAD. Ears and Nose: No external deformity. CV: RRR, No M/G/R. No JVD. No thrill. No extra heart sounds. PULM: CTA B, no wheezes, crackles, rhonchi. No retractions. No resp. distress. No accessory muscle use. EXTR: No c/c/e NEURO Normal gait.  PSYCH: Normally interactive. Conversant. Not depressed or anxious appearing.  Calm demeanor.  Left elbow: she is tender over the radial head, cannot fully extend the elbow.  Flexion is intact.  She has pain with pronation and supination and tenderness over the radial head.   The hand and wrist are non- tender, normal ROM, normal strength and pulses   [facility] reading (PRIMARY) by  Dr. Lorelei Pont. Left elbow: non -displaced radial head fracutre Left forearm: negative (history of wrist fracture a few years ago).    Placed in a  long arm posterior splint and sling  LEFT FOREARM - 2 VIEW  COMPARISON: None.  FINDINGS: Two views of the left forearm submitted. There is nondisplaced fracture of the radial head. Mild narrowing of radiocarpal joint space.  IMPRESSION: Nondisplaced fracture of the left radial head.  LEFT ELBOW - COMPLETE 3+ VIEW  COMPARISON: None.  FINDINGS: Elbow joint effusion from a vertical radial head fracture without articular surface displacement. Normal joint alignment.  IMPRESSION: Nondisplaced radial head fracture.   Assessment and Plan: Left radial head fracture, closed, initial encounter - Plan: Ambulatory referral to Orthopedic Surgery, HYDROcodone-acetaminophen (NORCO/VICODIN) 5-325 MG per tablet  Pain in left elbow - Plan: DG Elbow Complete Left, DG Forearm Left, HYDROcodone-acetaminophen (NORCO/VICODIN) 5-325 MG per tablet  Placed in a posterior splint and sling for non- displaced left radial head fracture.   Hydrocodone to use if needed for severe pain Plan follow-up with ortho in the next couple of days Follow-up with me if needed   Signed Lamar Blinks, MD

## 2015-05-15 ENCOUNTER — Telehealth: Payer: Self-pay | Admitting: Family Medicine

## 2015-05-15 NOTE — Telephone Encounter (Signed)
Called her- I think her upcoming appt at the end of the month is too long to wait for a recheck of her radial head fracture,  It should not be immobilized this long.  She agrees to come and see me tomorrow for a recheck and hopefully we can d/c her splint and sling

## 2015-05-16 ENCOUNTER — Ambulatory Visit (INDEPENDENT_AMBULATORY_CARE_PROVIDER_SITE_OTHER): Payer: No Typology Code available for payment source | Admitting: Family Medicine

## 2015-05-16 ENCOUNTER — Ambulatory Visit (INDEPENDENT_AMBULATORY_CARE_PROVIDER_SITE_OTHER): Payer: No Typology Code available for payment source

## 2015-05-16 VITALS — BP 100/68 | HR 69 | Temp 98.4°F | Resp 16 | Ht 65.0 in | Wt 162.0 lb

## 2015-05-16 DIAGNOSIS — S52122D Displaced fracture of head of left radius, subsequent encounter for closed fracture with routine healing: Secondary | ICD-10-CM

## 2015-05-16 DIAGNOSIS — M25522 Pain in left elbow: Secondary | ICD-10-CM

## 2015-05-16 NOTE — Progress Notes (Signed)
[facility] [address] [place] [address] [phone]  Date:  05/16/2015   Name:  Lisa Costa   DOB:  [birth year]   MRN:  [id number]  PCP:  Kevan Ny, MD    Chief Complaint: Follow-up   History of Present Illness:  Lisa Costa is a 64 y.o. very pleasant female patient who presents with the following:  Here to recheck a left radial head fracture.  She was seen here on Tuesday (today is Friday) for a fall/ injury that occurred over the weekend.  Noted to have a non- displaced left radial head fracture, placed in a posterior splint and sling on Tuesday She notes much reduced pain in her elbow but she is uncomfortable in the splint- would like to DC this today if at all possible She has done some movement of her elbow and notes improved ROM and decreased pain She does not have any swelling or issues with her wrist We were able to get her an ortho appt but not until 6/28- as this was too far away she came back to see me today.  She would prefer just to see me for the rest of her fracture care as it is more convenient and cheaper for her.    Patient Active Problem List   Diagnosis Date Noted  . ALLERGIC RHINITIS 08/27/2010  . STRESS INCONTINENCE 12/22/2009  . HYPERCHOLESTEROLEMIA 03/24/2009  . LUNG NODULE 01/03/2009  . TB SKIN TEST, POSITIVE 01/02/2009  . MICROSCOPIC HEMATURIA 12/31/2008  . VAGINAL MASS 12/05/2008  . ANXIETY DEPRESSION 07/22/2008  . OSTEOPENIA 05/07/2008  . DECREASED HEARING 05/06/2008  . DENTAL CARIES 10/03/2007  . GERD 10/03/2007    Past Medical History  Diagnosis Date  . Hypercholesteremia   . GERD (gastroesophageal reflux disease)   . Osteoporosis   . Heart murmur   . Thyroid disease 2010    Positive skin test/negative chest xray  . Left radial head fracture     Past Surgical History  Procedure Laterality Date  . Cesarean section      3 births    History  Substance Use Topics  . Smoking status:  Former Research scientist (life sciences)  . Smokeless tobacco: Never Used  . Alcohol Use: 1.0 oz/week    2 drink(s) per week     Comment: occational    Family History  Problem Relation Age of Onset  . Heart disease Mother     Allergies  Allergen Reactions  . Tramadol Nausea And Vomiting    Medication list has been reviewed and updated.  Current Outpatient Prescriptions on File Prior to Visit  Medication Sig Dispense Refill  . aspirin-acetaminophen-caffeine (EXCEDRIN MIGRAINE) 250-250-65 MG per tablet Take 2 tablets by mouth every 6 (six) hours as needed.    Marland Kitchen b complex vitamins tablet Take 1 tablet by mouth daily.      . calcium-vitamin D (OSCAL) 250-125 MG-UNIT per tablet Take 1 tablet by mouth daily.      . meclizine (ANTIVERT) 12.5 MG tablet Take 1 tablet (12.5 mg total) by mouth 3 (three) times daily as needed for dizziness. 30 tablet 0  . pantoprazole (PROTONIX) 40 MG tablet Take 40 mg by mouth daily.      . pravastatin (PRAVACHOL) 40 MG tablet Take 40 mg by mouth daily.    Marland Kitchen HYDROcodone-acetaminophen (NORCO/VICODIN) 5-325 MG per tablet Take 1 tablet by mouth every 8 (eight) hours as needed. (Patient not taking: Reported on 05/16/2015) 20 tablet 0   No  current facility-administered medications on file prior to visit.    Review of Systems:  As per HPI- otherwise negative.   Physical Examination: Filed Vitals:   05/16/15 0941  BP: 100/68  Pulse: 69  Temp: 98.4 F (36.9 C)  Resp: 16   Filed Vitals:   05/16/15 0941  Height: 5\' 5"  (1.651 m)  Weight: 162 lb (73.483 kg)   Body mass index is 26.96 kg/(m^2). Ideal Body Weight: Weight in (lb) to have BMI = 25: 149.9  GEN: WDWN, NAD, Non-toxic, A & O x 3, looks well HEENT: Atraumatic, Normocephalic. Neck supple. No masses, No LAD. Ears and Nose: No external deformity. CV: RRR, No M/G/R. No JVD. No thrill. No extra heart sounds. PULM: CTA B, no wheezes, crackles, rhonchi. No retractions. No resp. distress. No accessory muscle use. EXTR: No  c/c/e NEURO Normal gait.  PSYCH: Normally interactive. Conversant. Not depressed or anxious appearing.  Calm demeanor.  Left elbow: no swelling or bruise.  She has 90% of normal flexion, 90% of normal extension and full supination/ pronation.  Normal strength of the hand.  Hand is NV intact  [facility] reading (PRIMARY) by  Dr. Lorelei Pont. Left elbow: non- displaced radial head fracture  LEFT ELBOW - COMPLETE 3+ VIEW  COMPARISON: 05/13/2015  FINDINGS: There again noted changes consistent with a radial head fracture. Associated joint effusion is noted. These changes are stable from the recent forearm examination.  IMPRESSION: Stable radial head fracture  Assessment and Plan: Pain in left elbow - Plan: DG Elbow Complete Left  Left radial head fracture, closed, with routine healing, subsequent encounter - Plan: DG Elbow Complete Left  Removed splint, counseled to use sling as needed for pain Encouraged her to flex and extend, pronate and supinate the elbow several times a day focusing on quality over quantity.   She will see me in about 10 days for a recheck   Signed Lamar Blinks, MD

## 2015-05-16 NOTE — Patient Instructions (Addendum)
Your elbow appears to be doing well At this point you should do range of motion (bending, extending the elbow and rotating the forearm) several times a day Use the sling as needed for comfort only Please see me in 2 weeks for a recheck Call if any concerns in the meantime

## 2015-05-26 ENCOUNTER — Encounter: Payer: Self-pay | Admitting: Family Medicine

## 2015-05-26 ENCOUNTER — Ambulatory Visit (INDEPENDENT_AMBULATORY_CARE_PROVIDER_SITE_OTHER): Payer: No Typology Code available for payment source | Admitting: Family Medicine

## 2015-05-26 ENCOUNTER — Ambulatory Visit (INDEPENDENT_AMBULATORY_CARE_PROVIDER_SITE_OTHER): Payer: No Typology Code available for payment source

## 2015-05-26 ENCOUNTER — Ambulatory Visit: Payer: No Typology Code available for payment source | Admitting: Family Medicine

## 2015-05-26 VITALS — BP 136/82 | HR 62 | Temp 98.1°F | Resp 16 | Ht 64.5 in | Wt 161.6 lb

## 2015-05-26 DIAGNOSIS — S52122D Displaced fracture of head of left radius, subsequent encounter for closed fracture with routine healing: Secondary | ICD-10-CM

## 2015-05-26 DIAGNOSIS — R350 Frequency of micturition: Secondary | ICD-10-CM

## 2015-05-26 LAB — POCT UA - MICROSCOPIC ONLY
BACTERIA, U MICROSCOPIC: NEGATIVE
CASTS, UR, LPF, POC: NEGATIVE
CRYSTALS, UR, HPF, POC: NEGATIVE
EPITHELIAL CELLS, URINE PER MICROSCOPY: NEGATIVE
Mucus, UA: NEGATIVE
RBC, URINE, MICROSCOPIC: NEGATIVE
Yeast, UA: NEGATIVE

## 2015-05-26 LAB — POCT URINALYSIS DIPSTICK
Bilirubin, UA: NEGATIVE
Blood, UA: NEGATIVE
Glucose, UA: NEGATIVE
KETONES UA: NEGATIVE
Leukocytes, UA: NEGATIVE
Nitrite, UA: NEGATIVE
PROTEIN UA: NEGATIVE
Urobilinogen, UA: 0.2
pH, UA: 5.5

## 2015-05-26 MED ORDER — CEPHALEXIN 500 MG PO CAPS
500.0000 mg | ORAL_CAPSULE | Freq: Two times a day (BID) | ORAL | Status: DC
Start: 2015-05-26 — End: 2016-11-01

## 2015-05-26 NOTE — Progress Notes (Signed)
[facility] [address] [place] [address] [phone]  Date:  05/26/2015   Name:  Lisa Costa   DOB:  Oct 26, 1951   MRN:  [id number]  PCP:  Kevan Ny, MD    Chief Complaint: recheck left arm and urinary frequency, irritation x 3 days   History of Present Illness:  Lisa Costa is a 64 y.o. very pleasant female patient who presents with the following:  Here today to follow-up on a left elbow fracture (non- displaced, radial head) that occurred when she fell on 6/12.  At her last visit she was doing well, we DC's her splint and used sling for comfort only She notes that she has good ROM but still has some pain in her forearm She uses the sling for pain only- she may wear it for maybe 30 minutes a day.   She does have some pain with supination especially when she forgets   She has also noted urinary frequency and slight dysuria for about 3 days, no hematuria.   No fever, belly pain, back pain or vomiting  She works as a Education officer, museum.  She was up working late last night   BP Readings from Last 3 Encounters:  05/26/15 136/82  05/16/15 100/68  05/13/15 130/76     Patient Active Problem List   Diagnosis Date Noted  . ALLERGIC RHINITIS 08/27/2010  . STRESS INCONTINENCE 12/22/2009  . HYPERCHOLESTEROLEMIA 03/24/2009  . LUNG NODULE 01/03/2009  . TB SKIN TEST, POSITIVE 01/02/2009  . MICROSCOPIC HEMATURIA 12/31/2008  . VAGINAL MASS 12/05/2008  . ANXIETY DEPRESSION 07/22/2008  . OSTEOPENIA 05/07/2008  . DECREASED HEARING 05/06/2008  . DENTAL CARIES 10/03/2007  . GERD 10/03/2007    Past Medical History  Diagnosis Date  . Hypercholesteremia   . GERD (gastroesophageal reflux disease)   . Osteoporosis   . Heart murmur   . Thyroid disease 2010    Positive skin test/negative chest xray  . Left radial head fracture     Past Surgical History  Procedure Laterality Date  . Cesarean section      3 births    History   Substance Use Topics  . Smoking status: Former Research scientist (life sciences)  . Smokeless tobacco: Never Used  . Alcohol Use: 1.0 oz/week    2 drink(s) per week     Comment: occational    Family History  Problem Relation Age of Onset  . Heart disease Mother     Allergies  Allergen Reactions  . Tramadol Nausea And Vomiting    Medication list has been reviewed and updated.  Current Outpatient Prescriptions on File Prior to Visit  Medication Sig Dispense Refill  . aspirin-acetaminophen-caffeine (EXCEDRIN MIGRAINE) 250-250-65 MG per tablet Take 2 tablets by mouth every 6 (six) hours as needed.    Marland Kitchen b complex vitamins tablet Take 1 tablet by mouth daily.      . calcium-vitamin D (OSCAL) 250-125 MG-UNIT per tablet Take 1 tablet by mouth daily.      Marland Kitchen HYDROcodone-acetaminophen (NORCO/VICODIN) 5-325 MG per tablet Take 1 tablet by mouth every 8 (eight) hours as needed. 20 tablet 0  . meclizine (ANTIVERT) 12.5 MG tablet Take 1 tablet (12.5 mg total) by mouth 3 (three) times daily as needed for dizziness. 30 tablet 0  . pantoprazole (PROTONIX) 40 MG tablet Take 40 mg by mouth daily.      . pravastatin (PRAVACHOL) 40 MG tablet Take 40 mg by mouth daily.     No current  facility-administered medications on file prior to visit.    Review of Systems:  As per HPI- otherwise negative.   Physical Examination: Filed Vitals:   05/26/15 0812  BP: 136/82  Pulse: 62  Temp: 98.1 F (36.7 C)  Resp: 16   Filed Vitals:   05/26/15 0812  Height: 5' 4.5" (1.638 m)  Weight: 161 lb 9.6 oz (73.301 kg)   Body mass index is 27.32 kg/(m^2). Ideal Body Weight: Weight in (lb) to have BMI = 25: 147.6  GEN: WDWN, NAD, Non-toxic, A & O x 3, looks well HEENT: Atraumatic, Normocephalic. Neck supple. No masses, No LAD. Ears and Nose: No external deformity. CV: RRR, No M/G/R. No JVD. No thrill. No extra heart sounds. PULM: CTA B, no wheezes, crackles, rhonchi. No retractions. No resp. distress. No accessory muscle  use. ABD: S, NT, ND. No rebound. No HSM.  No CVA tenderness  EXTR: No c/c/e NEURO Normal gait.  PSYCH: Normally interactive. Conversant. Not depressed or anxious appearing.  Calm demeanor.  Left elbow: doing very well.  Mild tenderness over the radial head and her forearm is still sore from her fall.  She has all but a degree of two of extension, normal flexion, full supination and pronation  [facility] reading (PRIMARY) by  Dr. Lorelei Pont. Left elbow: normal healing of non- displaced radial head fracture.  No movement of fracture  Results for orders placed or performed in visit on 05/26/15  POCT UA - Microscopic Only  Result Value Ref Range   WBC, Ur, HPF, POC 0-3    RBC, urine, microscopic Negative    Bacteria, U Microscopic Negative    Mucus, UA Negaive    Epithelial cells, urine per micros Negative    Crystals, Ur, HPF, POC Negative    Casts, Ur, LPF, POC Negative    Yeast, UA Negative     Assessment and Plan: Left radial head fracture, closed, with routine healing, subsequent encounter - Plan: DG Elbow Complete Left  Urinary frequency - Plan: POCT UA - Microscopic Only, POCT urinalysis dipstick, Urine culture, cephALEXin (KEFLEX) 500 MG capsule  Well- healing radial head fracture Follow-up one week Continue ROM exercises and sling as needed Possible mild UTI.  Culture pending, keflex for now  Signed Lamar Blinks, MD

## 2015-05-26 NOTE — Patient Instructions (Signed)
You may have a UTI- I will be in touch with your urine culture results.  In the meantime let's treat you with keflex- an antibiotic Take it twice a day for one week and drink plenty of water- let me know if not getting better  Your elbow fracture looks good.  Continue doing what you are doing with range of motion exercises Continue to avoid lifting with the left arm Wear the sling as needed for pain- if your elbow is feeling sore you can wear your sling a bit more, just be sure to do your exercises Let's recheck in one week to check on your fracture

## 2015-05-28 LAB — URINE CULTURE

## 2015-06-03 ENCOUNTER — Ambulatory Visit (INDEPENDENT_AMBULATORY_CARE_PROVIDER_SITE_OTHER): Payer: No Typology Code available for payment source

## 2015-06-03 ENCOUNTER — Ambulatory Visit (INDEPENDENT_AMBULATORY_CARE_PROVIDER_SITE_OTHER): Payer: No Typology Code available for payment source | Admitting: Family Medicine

## 2015-06-03 VITALS — BP 104/68 | HR 74 | Temp 98.3°F | Resp 15 | Ht 64.5 in | Wt 162.0 lb

## 2015-06-03 DIAGNOSIS — S52122D Displaced fracture of head of left radius, subsequent encounter for closed fracture with routine healing: Secondary | ICD-10-CM | POA: Diagnosis not present

## 2015-06-03 DIAGNOSIS — M25522 Pain in left elbow: Secondary | ICD-10-CM | POA: Diagnosis not present

## 2015-06-03 NOTE — Patient Instructions (Signed)
Go ahead and take the keflex antibiotic Your elbow looks great- please see me in about 2 weeks for a recheck Continue to take it easy with your elbow- avoid lifting or anything that hurts your elbow Try benadryl cream for the itchy bites on your legs

## 2015-06-03 NOTE — Progress Notes (Signed)
[facility] [address], [place] [address] [phone]  Date:  06/03/2015   Name:  Lisa Costa   DOB:  1951-10-07   MRN:  [id number]  PCP:  Kevan Ny, MD    Chief Complaint: Follow-up   History of Present Illness:  Lisa Costa is a 64 y.o. very pleasant female patient who presents with the following:  Here today to recheck a non-displaced left radial head fracture.  She is doing well- still has a little pain but has full or nearly fullROM- maybe missing 1% of normal extension at this time.   She was also noted to have an e coli UTI at her last visit and was rx keflex- however states that she seemed to get better on her own and did not take the medication after all   Fracture occurred on 05/11/2015  She also notes a couple of small itchy lesion on her legs- wonders if bug bites  Patient Active Problem List   Diagnosis Date Noted  . ALLERGIC RHINITIS 08/27/2010  . STRESS INCONTINENCE 12/22/2009  . HYPERCHOLESTEROLEMIA 03/24/2009  . LUNG NODULE 01/03/2009  . TB SKIN TEST, POSITIVE 01/02/2009  . MICROSCOPIC HEMATURIA 12/31/2008  . VAGINAL MASS 12/05/2008  . ANXIETY DEPRESSION 07/22/2008  . OSTEOPENIA 05/07/2008  . DECREASED HEARING 05/06/2008  . DENTAL CARIES 10/03/2007  . GERD 10/03/2007    Past Medical History  Diagnosis Date  . Hypercholesteremia   . GERD (gastroesophageal reflux disease)   . Osteoporosis   . Heart murmur   . Thyroid disease 2010    Positive skin test/negative chest xray  . Left radial head fracture     Past Surgical History  Procedure Laterality Date  . Cesarean section      3 births    History  Substance Use Topics  . Smoking status: Former Research scientist (life sciences)  . Smokeless tobacco: Never Used  . Alcohol Use: 1.0 oz/week    2 drink(s) per week     Comment: occational    Family History  Problem Relation Age of Onset  . Heart disease Mother     Allergies  Allergen Reactions  . Tramadol Nausea  And Vomiting    Medication list has been reviewed and updated.  Current Outpatient Prescriptions on File Prior to Visit  Medication Sig Dispense Refill  . aspirin-acetaminophen-caffeine (EXCEDRIN MIGRAINE) 250-250-65 MG per tablet Take 2 tablets by mouth every 6 (six) hours as needed.    Marland Kitchen b complex vitamins tablet Take 1 tablet by mouth daily.      . calcium-vitamin D (OSCAL) 250-125 MG-UNIT per tablet Take 1 tablet by mouth daily.      . cephALEXin (KEFLEX) 500 MG capsule Take 1 capsule (500 mg total) by mouth 2 (two) times daily. 14 capsule 0  . HYDROcodone-acetaminophen (NORCO/VICODIN) 5-325 MG per tablet Take 1 tablet by mouth every 8 (eight) hours as needed. 20 tablet 0  . meclizine (ANTIVERT) 12.5 MG tablet Take 1 tablet (12.5 mg total) by mouth 3 (three) times daily as needed for dizziness. 30 tablet 0  . pantoprazole (PROTONIX) 40 MG tablet Take 40 mg by mouth daily.      . pravastatin (PRAVACHOL) 40 MG tablet Take 40 mg by mouth daily.     No current facility-administered medications on file prior to visit.    Review of Systems:  As per HPI- otherwise negative.   Physical Examination: Filed Vitals:   06/03/15 0855  BP: 104/68  Pulse: 74  Temp: 98.3 F (36.8 C)  Resp: 15   Filed Vitals:   06/03/15 0855  Height: 5' 4.5" (1.638 m)  Weight: 162 lb (73.483 kg)   Body mass index is 27.39 kg/(m^2). Ideal Body Weight: Weight in (lb) to have BMI = 25: 147.6  GEN: WDWN, NAD, Non-toxic, A & O x 3, overweight, looks well HEENT: Atraumatic, Normocephalic. Neck supple. No masses, No LAD. Ears and Nose: No external deformity. CV: RRR, No M/G/R. No JVD. No thrill. No extra heart sounds. PULM: CTA B, no wheezes, crackles, rhonchi. No retractions. No resp. distress. No accessory muscle use. ABD: S, NT, ND, +BS. No rebound. No HSM. EXTR: No c/c/e NEURO Normal gait.  PSYCH: Normally interactive. Conversant. Not depressed or anxious appearing.  Calm demeanor.  Left elbow: full  flexion, normal pronation and supination. Extension is full to very minimally reduced compared with right   She has a couple of tiny irritated lesions on her legs- c/w either insect bites or small areas of contact derm- rhus  [facility] reading (PRIMARY) by  Dr. Lorelei Pont. Let elbow: non- displaced left radial head fracture visible on one oblique view.  Fat pad is resolved.  Callus is visible   Assessment and Plan: Left radial head fracture, closed, with routine healing, subsequent encounter - Plan: DG Elbow Complete Left  Left elbow pain - Plan: DG Elbow Complete Left  Continues to improve, ROM is excellent.  Continue to avoid lifting or painful motions, see me in 2 weeks See patient instructions for more details.     Signed Lamar Blinks, MD

## 2015-06-16 ENCOUNTER — Ambulatory Visit (INDEPENDENT_AMBULATORY_CARE_PROVIDER_SITE_OTHER): Payer: No Typology Code available for payment source

## 2015-06-16 ENCOUNTER — Encounter: Payer: Self-pay | Admitting: Family Medicine

## 2015-06-16 ENCOUNTER — Ambulatory Visit (INDEPENDENT_AMBULATORY_CARE_PROVIDER_SITE_OTHER): Payer: No Typology Code available for payment source | Admitting: Family Medicine

## 2015-06-16 VITALS — BP 117/67 | HR 68 | Temp 97.7°F | Resp 16 | Ht 65.0 in | Wt 162.2 lb

## 2015-06-16 DIAGNOSIS — E785 Hyperlipidemia, unspecified: Secondary | ICD-10-CM | POA: Diagnosis not present

## 2015-06-16 DIAGNOSIS — S52122A Displaced fracture of head of left radius, initial encounter for closed fracture: Secondary | ICD-10-CM | POA: Diagnosis not present

## 2015-06-16 DIAGNOSIS — K2100 Gastro-esophageal reflux disease with esophagitis, without bleeding: Secondary | ICD-10-CM

## 2015-06-16 DIAGNOSIS — S52122D Displaced fracture of head of left radius, subsequent encounter for closed fracture with routine healing: Secondary | ICD-10-CM | POA: Diagnosis not present

## 2015-06-16 MED ORDER — PRAVASTATIN SODIUM 40 MG PO TABS: 40.0000 mg | ORAL_TABLET | Freq: Every day | ORAL | Status: DC

## 2015-06-16 MED ORDER — PANTOPRAZOLE SODIUM 40 MG PO TBEC: 40.0000 mg | DELAYED_RELEASE_TABLET | Freq: Every day | ORAL | Status: DC

## 2015-06-16 NOTE — Progress Notes (Signed)
[facility] [address], [place] [address] [phone]  Date:  06/16/2015   Name:  Lisa Costa   DOB:  October 04, 1951   MRN:  [id number]  PCP:  Kevan Ny, MD    Chief Complaint: Follow-up; Elbow Pain; and Medication Refill   History of Present Illness:  Lisa Costa is a 64 y.o. very pleasant female patient who presents with the following:  Here today to recheck a non- displaced left radial head fracture that occurred on 05/11/15. She is now 5 weeks out. She reports that she has minimal pain in her left arn.  She is able to do her ADLs, just does not lift anything heavy with her arm.  She has full ROM as far as she can tell  Also needs RF of her protonix and pravastain today  Patient Active Problem List   Diagnosis Date Noted  . ALLERGIC RHINITIS 08/27/2010  . STRESS INCONTINENCE 12/22/2009  . HYPERCHOLESTEROLEMIA 03/24/2009  . LUNG NODULE 01/03/2009  . TB SKIN TEST, POSITIVE 01/02/2009  . MICROSCOPIC HEMATURIA 12/31/2008  . VAGINAL MASS 12/05/2008  . ANXIETY DEPRESSION 07/22/2008  . OSTEOPENIA 05/07/2008  . DECREASED HEARING 05/06/2008  . DENTAL CARIES 10/03/2007  . GERD 10/03/2007    Past Medical History  Diagnosis Date  . Hypercholesteremia   . GERD (gastroesophageal reflux disease)   . Osteoporosis   . Heart murmur   . Thyroid disease 2010    Positive skin test/negative chest xray  . Left radial head fracture     Past Surgical History  Procedure Laterality Date  . Cesarean section      3 births    History  Substance Use Topics  . Smoking status: Former Research scientist (life sciences)  . Smokeless tobacco: Never Used  . Alcohol Use: 1.0 oz/week    2 drink(s) per week     Comment: occational    Family History  Problem Relation Age of Onset  . Heart disease Mother     Allergies  Allergen Reactions  . Tramadol Nausea And Vomiting    Medication list has been reviewed and updated.  Current Outpatient Prescriptions on File  Prior to Visit  Medication Sig Dispense Refill  . aspirin-acetaminophen-caffeine (EXCEDRIN MIGRAINE) 250-250-65 MG per tablet Take 2 tablets by mouth every 6 (six) hours as needed.    Marland Kitchen b complex vitamins tablet Take 1 tablet by mouth daily.      . calcium-vitamin D (OSCAL) 250-125 MG-UNIT per tablet Take 1 tablet by mouth daily.      . cephALEXin (KEFLEX) 500 MG capsule Take 1 capsule (500 mg total) by mouth 2 (two) times daily. 14 capsule 0  . HYDROcodone-acetaminophen (NORCO/VICODIN) 5-325 MG per tablet Take 1 tablet by mouth every 8 (eight) hours as needed. 20 tablet 0  . meclizine (ANTIVERT) 12.5 MG tablet Take 1 tablet (12.5 mg total) by mouth 3 (three) times daily as needed for dizziness. 30 tablet 0  . pantoprazole (PROTONIX) 40 MG tablet Take 40 mg by mouth daily.      . pravastatin (PRAVACHOL) 40 MG tablet Take 40 mg by mouth daily.     No current facility-administered medications on file prior to visit.    Review of Systems:  As per HPI- otherwise negative.   Physical Examination: Filed Vitals:   06/16/15 0759  BP: 117/67  Pulse: 68  Temp: 97.7 F (36.5 C)  Resp: 16   Filed Vitals:   06/16/15 0759  Height: 5\' 5"  (1.651 m)  Weight: 162 lb 3.2 oz (73.573 kg)   Body mass index is 26.99 kg/(m^2). Ideal Body Weight: Weight in (lb) to have BMI = 25: 149.9  GEN: WDWN, NAD, Non-toxic, A & O x 3, looks well HEENT: Atraumatic, Normocephalic. Neck supple. No masses, No LAD. Ears and Nose: No external deformity. CV: RRR, No M/G/R. No JVD. No thrill. No extra heart sounds. PULM: CTA B, no wheezes, crackles, rhonchi. No retractions. No resp. distress. No accessory muscle use. EXTR: No c/c/e NEURO Normal gait.  PSYCH: Normally interactive. Conversant. Not depressed or anxious appearing.  Calm demeanor.  Full ROM of left elbow to flexion/ extension/ pronation and supination.  I do not appreciate any difference in her ROM from right to left arm.  Mild tenderness to palpation  over the left radial head Normal strength of the LUE  [facility] reading (PRIMARY) by  Dr. Lorelei Pont. Left elbow: continued healing of non- displaced radial head fracture  LEFT ELBOW - COMPLETE 3+ VIEW  COMPARISON: 06/03/2015.  FINDINGS: Healing fracture of the left radial head and neck is again noted. Callus formation is noted. No evidence displacement.  IMPRESSION: Continued healing of proximal left radial fracture. No evidence of displacement. No new abnormality identified.  Assessment and Plan: Left radial head fracture, closed, with routine healing, subsequent encounter - Plan: DG Elbow Complete Left  Hyperlipidemia - Plan: pravastatin (PRAVACHOL) 40 MG tablet  Gastroesophageal reflux disease with esophagitis - Plan: pantoprazole (PROTONIX) 40 MG tablet  Her elbow continues to do well.  Plan to check again in 3 weeks, at which time she will be 8 weeks out.  If doing well then can d/c follow-up Refilled her medications as per his request   Signed Lamar Blinks, MD

## 2015-06-16 NOTE — Patient Instructions (Signed)
Please see me in 3 weeks for a final check of your elbow.

## 2015-07-04 ENCOUNTER — Ambulatory Visit (INDEPENDENT_AMBULATORY_CARE_PROVIDER_SITE_OTHER): Payer: No Typology Code available for payment source | Admitting: Family Medicine

## 2015-07-04 VITALS — BP 124/74 | HR 76 | Temp 98.1°F | Resp 16 | Ht 66.0 in | Wt 164.0 lb

## 2015-07-04 DIAGNOSIS — R0982 Postnasal drip: Secondary | ICD-10-CM | POA: Diagnosis not present

## 2015-07-04 DIAGNOSIS — S52122D Displaced fracture of head of left radius, subsequent encounter for closed fracture with routine healing: Secondary | ICD-10-CM | POA: Diagnosis not present

## 2015-07-04 MED ORDER — IPRATROPIUM BROMIDE 0.03 % NA SOLN: 2.0000 | Freq: Four times a day (QID) | NASAL | Status: DC

## 2015-07-04 NOTE — Progress Notes (Signed)
[facility] [address] [place] [address] [phone]  Date:  07/04/2015   Name:  Lisa Costa   DOB:  20-Feb-1951   MRN:  [id number]  PCP:  Kevan Ny, MD    Chief Complaint: Follow-up and Sore Throat   History of Present Illness:  Lisa Costa is a 64 y.o. very pleasant female patient who presents with the following:  Here today to follow-up a left radial head fracture which occurred on 05/11/15.  She has been healing quite well, expect this to be her last recheck today if continuing to improve   She states that her elbow is nearly pain free.  She is using it normally without any concerns Also, she has noted a "tickle" in her throat, sneezing this am Her throat has been bothersome/ tickly for about 3 days She is not coughing much, just when her throat tickles She has felt chilly, but no fever noted.   Occasional runny nose  Patient Active Problem List   Diagnosis Date Noted  . ALLERGIC RHINITIS 08/27/2010  . STRESS INCONTINENCE 12/22/2009  . HYPERCHOLESTEROLEMIA 03/24/2009  . LUNG NODULE 01/03/2009  . TB SKIN TEST, POSITIVE 01/02/2009  . MICROSCOPIC HEMATURIA 12/31/2008  . VAGINAL MASS 12/05/2008  . ANXIETY DEPRESSION 07/22/2008  . OSTEOPENIA 05/07/2008  . DECREASED HEARING 05/06/2008  . DENTAL CARIES 10/03/2007  . GERD 10/03/2007    Past Medical History  Diagnosis Date  . Hypercholesteremia   . GERD (gastroesophageal reflux disease)   . Osteoporosis   . Heart murmur   . Thyroid disease 2010    Positive skin test/negative chest xray  . Left radial head fracture     Past Surgical History  Procedure Laterality Date  . Cesarean section      3 births    History  Substance Use Topics  . Smoking status: Former Research scientist (life sciences)  . Smokeless tobacco: Never Used  . Alcohol Use: 1.0 oz/week    2 drink(s) per week     Comment: occational    Family History  Problem Relation Age of Onset  . Heart disease Mother      Allergies  Allergen Reactions  . Tramadol Nausea And Vomiting    Medication list has been reviewed and updated.  Current Outpatient Prescriptions on File Prior to Visit  Medication Sig Dispense Refill  . aspirin-acetaminophen-caffeine (EXCEDRIN MIGRAINE) 250-250-65 MG per tablet Take 2 tablets by mouth every 6 (six) hours as needed.    Marland Kitchen b complex vitamins tablet Take 1 tablet by mouth daily.      . calcium-vitamin D (OSCAL) 250-125 MG-UNIT per tablet Take 1 tablet by mouth daily.      . cephALEXin (KEFLEX) 500 MG capsule Take 1 capsule (500 mg total) by mouth 2 (two) times daily. 14 capsule 0  . HYDROcodone-acetaminophen (NORCO/VICODIN) 5-325 MG per tablet Take 1 tablet by mouth every 8 (eight) hours as needed. 20 tablet 0  . meclizine (ANTIVERT) 12.5 MG tablet Take 1 tablet (12.5 mg total) by mouth 3 (three) times daily as needed for dizziness. 30 tablet 0  . pantoprazole (PROTONIX) 40 MG tablet Take 1 tablet (40 mg total) by mouth daily. 90 tablet 3  . pravastatin (PRAVACHOL) 40 MG tablet Take 1 tablet (40 mg total) by mouth daily. 90 tablet 3   No current facility-administered medications on file prior to visit.    Review of Systems:  As per HPI- otherwise negative.   Physical Examination: Filed Vitals:  07/04/15 0838  BP: 124/74  Pulse: 76  Temp: 98.1 F (36.7 C)  Resp: 16   Filed Vitals:   07/04/15 0838  Height: 5\' 6"  (1.676 m)  Weight: 164 lb (74.39 kg)   Body mass index is 26.48 kg/(m^2). Ideal Body Weight: Weight in (lb) to have BMI = 25: 154.6  GEN: WDWN, NAD, Non-toxic, A & O x 3, mild overweight, looks well HEENT: Atraumatic, Normocephalic. Neck supple. No masses, No LAD.  Bilateral TM wnl, oropharynx normal.  PEERL,EOMI.   Ears and Nose: No external deformity. CV: RRR, No M/G/R. No JVD. No thrill. No extra heart sounds. PULM: CTA B, no wheezes, crackles, rhonchi. No retractions. No resp. distress. No accessory muscle use. EXTR: No c/c/e NEURO  Normal gait.  PSYCH: Normally interactive. Conversant. Not depressed or anxious appearing.  Calm demeanor.  Left elbow:  100% normal flexion/ extension of the left elbow.  Full supination and pronation.  Normal strength of the UE. She notes minimal tenderness with direct pressure over the radial head.   Assessment and Plan: Left radial head fracture, closed, with routine healing, subsequent encounter  PND (post-nasal drip) - Plan: ipratropium (ATROVENT) 0.03 % nasal spray  Elbow fracture is healed.  X-ray done 3 weeks ago which looked fine.  No x-ray needed today No further follow-up unless she develops any sx atrovent nasal spray for URI/ allergy sx She will let us know if any concerns   Signed Lamar Blinks, MD

## 2015-07-04 NOTE — Patient Instructions (Signed)
Your elbow looks great- it seems to be healing very well!  Let me know if you have any trouble with it, but otherwise we do not need to look at it again  Try the atrovent nasal spray for your sneezing and sore throat- use it as needed Let me know if you have any other concerns

## 2015-08-20 ENCOUNTER — Telehealth: Payer: Self-pay

## 2015-08-20 NOTE — Telephone Encounter (Signed)
We cannot refill antibiotics. Left voicemail letting pt know.

## 2015-08-20 NOTE — Telephone Encounter (Signed)
Patient is calling her infection is coming back from a month ago and would like to know if she can get an anti biotic. Please advise! [phone]

## 2015-08-22 ENCOUNTER — Ambulatory Visit (INDEPENDENT_AMBULATORY_CARE_PROVIDER_SITE_OTHER): Payer: No Typology Code available for payment source | Admitting: Family Medicine

## 2015-08-22 VITALS — BP 118/68 | HR 88 | Temp 99.0°F | Resp 18 | Ht 65.0 in | Wt 162.0 lb

## 2015-08-22 DIAGNOSIS — N3090 Cystitis, unspecified without hematuria: Secondary | ICD-10-CM | POA: Diagnosis not present

## 2015-08-22 DIAGNOSIS — R300 Dysuria: Secondary | ICD-10-CM | POA: Diagnosis not present

## 2015-08-22 DIAGNOSIS — N10 Acute tubulo-interstitial nephritis: Secondary | ICD-10-CM

## 2015-08-22 LAB — POCT CBC
Granulocyte percent: 81.7 %G — AB (ref 37–80)
HCT, POC: 40.6 % (ref 37.7–47.9)
Hemoglobin: 13.3 g/dL (ref 12.2–16.2)
Lymph, poc: 0.9 (ref 0.6–3.4)
MCH, POC: 30.6 pg (ref 27–31.2)
MCHC: 32.8 g/dL (ref 31.8–35.4)
MCV: 93.2 fL (ref 80–97)
MID (CBC): 0.5 (ref 0–0.9)
MPV: 7.6 fL (ref 0–99.8)
POC GRANULOCYTE: 6.3 (ref 2–6.9)
POC LYMPH %: 11.7 % (ref 10–50)
POC MID %: 6.6 %M (ref 0–12)
Platelet Count, POC: 273 10*3/uL (ref 142–424)
RBC: 4.36 M/uL (ref 4.04–5.48)
RDW, POC: 13.2 %
WBC: 7.7 10*3/uL (ref 4.6–10.2)

## 2015-08-22 LAB — POCT URINALYSIS DIP (MANUAL ENTRY)
Bilirubin, UA: NEGATIVE
Glucose, UA: NEGATIVE
Ketones, POC UA: NEGATIVE
NITRITE UA: NEGATIVE
PH UA: 6
Protein Ur, POC: 100 — AB
Spec Grav, UA: 1.02
UROBILINOGEN UA: 0.2

## 2015-08-22 LAB — POC MICROSCOPIC URINALYSIS (UMFC): Mucus: ABSENT

## 2015-08-22 LAB — BASIC METABOLIC PANEL
BUN: 11 mg/dL (ref 7–25)
CALCIUM: 8.8 mg/dL (ref 8.6–10.4)
CO2: 24 mmol/L (ref 20–31)
Chloride: 103 mmol/L (ref 98–110)
Creat: 0.75 mg/dL (ref 0.50–0.99)
GLUCOSE: 94 mg/dL (ref 65–99)
Potassium: 3.8 mmol/L (ref 3.5–5.3)
Sodium: 138 mmol/L (ref 135–146)

## 2015-08-22 MED ORDER — PHENAZOPYRIDINE HCL 200 MG PO TABS: 200.0000 mg | ORAL_TABLET | Freq: Three times a day (TID) | ORAL | Status: DC | PRN

## 2015-08-22 MED ORDER — CEPHALEXIN 500 MG PO CAPS: 500.0000 mg | ORAL_CAPSULE | Freq: Three times a day (TID) | ORAL | Status: DC

## 2015-08-22 NOTE — Patient Instructions (Addendum)
Drink plenty of fluids and get enough rest  Stay off of work today.   Take the antibiotics, cephalexin, on one pill 3 times daily  Take Pyridium 200 mg 13 times daily if needed for urinary pain and frequency. This will make the urine a dark brown color, and only needs to be continued for a day or 2. As soon as the antibiotics start improving things the burning and pain will diminish.  Return if needed

## 2015-08-22 NOTE — Progress Notes (Addendum)
[name], female    DOB: [birth year]  Age: 64 y.o. MRN: [id number]  Chief Complaint  Patient presents with  . burning with urination    x1 week   . Fever    at night   . Chills    at night   . Headache  . Nausea  . Bloated  . Shortness of Breath    last night     Subjective:   64 year old lady who has been having urinary symptoms since Sunday. She has had dysuria and frequency. She has now started having some chills. She has had some back pains. She has continued to work. She is nauseated without vomiting. She does have a history of urinary tract infections in the past. She's been having nocturia for 5 times a night, which is a couple of times more than usual. She has passed some blood and mucus. She takes some baking soda and takes Protonix. Has been taking ibuprofen because of the shaking and chills, 800 mg.  Current allergies, medications, problem list, past/family and social histories reviewed.  Objective:  BP 118/68 mmHg  Pulse 88  Temp(Src) 99 F (37.2 C) (Oral)  Resp 18  Ht 5\' 5"  (1.651 m)  Wt 162 lb (73.483 kg)  BMI 26.96 kg/m2  SpO2 98%  No major distress. She has some mild right CVA tenderness. Abdomen has normal bowel sounds and is soft but has some generalized tenderness across lower abdomen and to the right side of the abdomen.  Assessment & Plan:   Assessment: 1. Burning with urination   2. Cystitis   3. Acute pyelonephritis       Plan: Results for orders placed or performed in visit on 08/22/15  Urine culture  Result Value Ref Range   Colony Count >=100,000 COLONIES/ML    Preliminary Report ESCHERICHIA COLI   Basic metabolic panel  Result Value Ref Range   Sodium 138 135 - 146 mmol/L   Potassium 3.8 3.5 - 5.3 mmol/L   Chloride 103 98 - 110 mmol/L   CO2 24 20 - 31 mmol/L   Glucose, Bld 94 65 - 99 mg/dL   BUN 11 7 - 25 mg/dL   Creat 0.75 0.50 - 0.99 mg/dL   Calcium 8.8 8.6 - 10.4 mg/dL  POCT Microscopic Urinalysis  ([facility])  Result Value Ref Range   WBC,UR,HPF,POC Many (A) None WBC/hpf   RBC,UR,HPF,POC Many (A) None RBC/hpf   Bacteria Moderate (A) None   Mucus Absent Absent   Epithelial Cells, UR Per Microscopy Few (A) None cells/hpf  POCT urinalysis dipstick  Result Value Ref Range   Color, UA yellow yellow   Clarity, UA cloudy (A) clear   Glucose, UA negative negative   Bilirubin, UA negative negative   Ketones, POC UA negative negative   Spec Grav, UA 1.020    Blood, UA large (A) negative   pH, UA 6.0    Protein Ur, POC =100 (A) negative   Urobilinogen, UA 0.2    Nitrite, UA Negative Negative   Leukocytes, UA large (3+) (A) Negative  POCT CBC  Result Value Ref Range   WBC 7.7 4.6 - 10.2 K/uL   Lymph, poc 0.9 0.6 - 3.4   POC LYMPH PERCENT 11.7 10 - 50 %L   MID (cbc) 0.5 0 - 0.9   POC MID % 6.6 0 - 12 %M   POC Granulocyte 6.3 2 - 6.9   Granulocyte percent 81.7 (A) 37 - 80 %G  RBC 4.36 4.04 - 5.48 M/uL   Hemoglobin 13.3 12.2 - 16.2 g/dL   HCT, POC 40.6 37.7 - 47.9 %   MCV 93.2 80 - 97 fL   MCH, POC 30.6 27 - 31.2 pg   MCHC 32.8 31.8 - 35.4 g/dL   RDW, POC 13.2 %   Platelet Count, POC 273 142 - 424 K/uL   MPV 7.6 0 - 99.8 fL     Orders Placed This Encounter  Procedures  . Urine culture  . Basic metabolic panel  . POCT Microscopic Urinalysis ([facility])  . POCT urinalysis dipstick  . POCT CBC    Meds ordered this encounter  Medications  . cephALEXin (KEFLEX) 500 MG capsule    Sig: Take 1 capsule (500 mg total) by mouth 3 (three) times daily.    Dispense:  30 capsule    Refill:  0  . phenazopyridine (PYRIDIUM) 200 MG tablet    Sig: Take 1 tablet (200 mg total) by mouth 3 (three) times daily as needed for pain.    Dispense:  10 tablet    Refill:  0         Patient Instructions  Drink plenty of fluids and get enough rest  Stay off of work today.   Take the antibiotics, cephalexin, on one pill 3 times daily  Take Pyridium 200 mg 13 times daily if needed for urinary  pain and frequency. This will make the urine a dark brown color, and only needs to be continued for a day or 2. As soon as the antibiotics start improving things the burning and pain will diminish.  Return if needed       No Follow-up on file.   HOPPER,DAVID, MD 08/24/2015

## 2015-08-24 LAB — URINE CULTURE: Colony Count: >=100000

## 2015-11-25 ENCOUNTER — Ambulatory Visit (INDEPENDENT_AMBULATORY_CARE_PROVIDER_SITE_OTHER): Payer: No Typology Code available for payment source | Admitting: Emergency Medicine

## 2015-11-25 VITALS — BP 118/72 | HR 76 | Temp 97.3°F | Resp 16 | Ht 65.0 in | Wt 159.0 lb

## 2015-11-25 DIAGNOSIS — R197 Diarrhea, unspecified: Secondary | ICD-10-CM

## 2015-11-25 DIAGNOSIS — R42 Dizziness and giddiness: Secondary | ICD-10-CM | POA: Diagnosis not present

## 2015-11-25 DIAGNOSIS — R195 Other fecal abnormalities: Secondary | ICD-10-CM | POA: Diagnosis not present

## 2015-11-25 DIAGNOSIS — R110 Nausea: Secondary | ICD-10-CM

## 2015-11-25 LAB — POCT URINALYSIS DIP (MANUAL ENTRY)
Bilirubin, UA: NEGATIVE
GLUCOSE UA: NEGATIVE
Ketones, POC UA: NEGATIVE
Leukocytes, UA: NEGATIVE
NITRITE UA: NEGATIVE
Protein Ur, POC: NEGATIVE
Spec Grav, UA: 1.02
UROBILINOGEN UA: 0.2
pH, UA: 5

## 2015-11-25 LAB — POCT CBC
Granulocyte percent: 61.9 %G (ref 37–80)
HCT, POC: 41.5 % (ref 37.7–47.9)
HEMOGLOBIN: 13.9 g/dL (ref 12.2–16.2)
LYMPH, POC: 1.6 (ref 0.6–3.4)
MCH, POC: 31 pg (ref 27–31.2)
MCHC: 33.5 g/dL (ref 31.8–35.4)
MCV: 92.5 fL (ref 80–97)
MID (CBC): 0.3 (ref 0–0.9)
MPV: 7.7 fL (ref 0–99.8)
POC Granulocyte: 3.1 (ref 2–6.9)
POC LYMPH %: 31.9 % (ref 10–50)
POC MID %: 6.2 % (ref 0–12)
Platelet Count, POC: 247 10*3/uL (ref 142–424)
RBC: 4.49 M/uL (ref 4.04–5.48)
RDW, POC: 13.6 %
WBC: 5 10*3/uL (ref 4.6–10.2)

## 2015-11-25 LAB — COMPREHENSIVE METABOLIC PANEL
ALBUMIN: 4.1 g/dL (ref 3.6–5.1)
ALT: 23 U/L (ref 6–29)
AST: 21 U/L (ref 10–35)
Alkaline Phosphatase: 60 U/L (ref 33–130)
BUN: 11 mg/dL (ref 7–25)
CHLORIDE: 106 mmol/L (ref 98–110)
CO2: 23 mmol/L (ref 20–31)
Calcium: 9.4 mg/dL (ref 8.6–10.4)
Creat: 0.67 mg/dL (ref 0.50–0.99)
Glucose, Bld: 100 mg/dL — ABNORMAL HIGH (ref 65–99)
POTASSIUM: 4.3 mmol/L (ref 3.5–5.3)
Sodium: 140 mmol/L (ref 135–146)
TOTAL PROTEIN: 6.8 g/dL (ref 6.1–8.1)
Total Bilirubin: 0.4 mg/dL (ref 0.2–1.2)

## 2015-11-25 LAB — POC MICROSCOPIC URINALYSIS (UMFC): Mucus: ABSENT

## 2015-11-25 LAB — IFOBT (OCCULT BLOOD): IFOBT: NEGATIVE

## 2015-11-25 NOTE — Progress Notes (Signed)
Subjective:  [name], female    DOB: February 03, 1951  Age: 64 y.o. MRN: [id number]  CC: Dizziness and Blood In Stools   HPI Lisa Costa presents  the patient describes having several episodes of diarrhea over the last 6 weeks. Mucus or pus or stool up until yesterday when she first noticed that she had mucus in her stool. She has intermittent dizziness not this early associated with diarrhea. She has had nausea but no vomiting over the weekend. Her chills. Doesn't use excess alcohol and nonsteroidal anti-inflammatories or aspirin. She has no heartburn.  She said that her dizziness lasted short period of time is intermittent when it when it occurs and is easily controlled with Antivert. She has no antecedent illness or injury. No nasal congestion postnasal drainage of pain in her ears fever chills cough or other complaints. No neurologic or visual symptoms associated with dizziness  History Lisa Costa has a past medical history of Hypercholesteremia; GERD (gastroesophageal reflux disease); Osteoporosis; Heart murmur; Thyroid disease (2010); and Left radial head fracture.   She has past surgical history that includes Cesarean section.   Her  family history includes Heart disease in her mother.  She   reports that she has quit smoking. She has never used smokeless tobacco. She reports that she drinks about 1.0 oz of alcohol per week. She reports that she does not use illicit drugs.  Outpatient Prescriptions Prior to Visit  Medication Sig Dispense Refill  . b complex vitamins tablet Take 1 tablet by mouth daily.      Marland Kitchen HYDROcodone-acetaminophen (NORCO/VICODIN) 5-325 MG per tablet Take 1 tablet by mouth every 8 (eight) hours as needed. 20 tablet 0  . meclizine (ANTIVERT) 12.5 MG tablet Take 1 tablet (12.5 mg total) by mouth 3 (three) times daily as needed for dizziness. 30 tablet 0  . aspirin-acetaminophen-caffeine (EXCEDRIN MIGRAINE) [id number] MG per tablet  Take 2 tablets by mouth every 6 (six) hours as needed. Reported on 11/25/2015    . calcium-vitamin D (OSCAL) 250-125 MG-UNIT per tablet Take 1 tablet by mouth daily. Reported on 11/25/2015    . cephALEXin (KEFLEX) 500 MG capsule Take 1 capsule (500 mg total) by mouth 2 (two) times daily. (Patient not taking: Reported on 08/22/2015) 14 capsule 0  . cephALEXin (KEFLEX) 500 MG capsule Take 1 capsule (500 mg total) by mouth 3 (three) times daily. (Patient not taking: Reported on 11/25/2015) 30 capsule 0  . ipratropium (ATROVENT) 0.03 % nasal spray Place 2 sprays into the nose 4 (four) times daily. (Patient not taking: Reported on 08/22/2015) 30 mL 6  . pantoprazole (PROTONIX) 40 MG tablet Take 1 tablet (40 mg total) by mouth daily. (Patient not taking: Reported on 11/25/2015) 90 tablet 3  . phenazopyridine (PYRIDIUM) 200 MG tablet Take 1 tablet (200 mg total) by mouth 3 (three) times daily as needed for pain. (Patient not taking: Reported on 11/25/2015) 10 tablet 0   No facility-administered medications prior to visit.    Social History   Social History  . Marital Status: Single    Spouse Name: N/A  . Number of Children: N/A  . Years of Education: N/A   Social History Main Topics  . Smoking status: Former Research scientist (life sciences)  . Smokeless tobacco: Never Used  . Alcohol Use: 1.0 oz/week    2 drink(s) per week     Comment: occational  . Drug Use: No  . Sexual Activity: No   Other Topics Concern  . None  Social History Narrative     Review of Systems  Constitutional: Negative for fever, chills and appetite change.  HENT: Negative for congestion, ear pain, postnasal drip, sinus pressure and sore throat.   Eyes: Negative for pain and redness.  Respiratory: Negative for cough, shortness of breath and wheezing.   Cardiovascular: Negative for leg swelling.  Gastrointestinal: Positive for nausea, diarrhea and blood in stool. Negative for vomiting, abdominal pain and constipation.  Endocrine: Negative  for polyuria.  Genitourinary: Negative for dysuria, urgency, frequency and flank pain.  Musculoskeletal: Negative for gait problem.  Skin: Negative for rash.  Neurological: Positive for dizziness. Negative for weakness and headaches.  Psychiatric/Behavioral: Negative for confusion and decreased concentration. The patient is not nervous/anxious.     Objective:  BP 118/72 mmHg  Pulse 76  Temp(Src) 97.3 F (36.3 C) (Oral)  Resp 16  Ht 5\' 5"  (1.651 m)  Wt 159 lb (72.122 kg)  BMI 26.46 kg/m2  SpO2 97%  Physical Exam  Constitutional: She is oriented to person, place, and time. She appears well-developed and well-nourished. No distress.  HENT:  Head: Normocephalic and atraumatic.  Right Ear: External ear normal.  Left Ear: External ear normal.  Nose: Nose normal.  Eyes: Conjunctivae and EOM are normal. Pupils are equal, round, and reactive to light. No scleral icterus.  Neck: Normal range of motion. Neck supple. No tracheal deviation present.  Cardiovascular: Normal rate, regular rhythm and normal heart sounds.   Pulmonary/Chest: Effort normal. No respiratory distress. She has no wheezes. She has no rales.  Abdominal: She exhibits no mass. There is no tenderness. There is no rebound and no guarding.  Musculoskeletal: She exhibits no edema.  Lymphadenopathy:    She has no cervical adenopathy.  Neurological: She is alert and oriented to person, place, and time. Coordination normal.  Skin: Skin is warm and dry. No rash noted.  Psychiatric: She has a normal mood and affect. Her behavior is normal.      Assessment & Plan:   Lisa Costa was seen today for dizziness and blood in stools.  Diagnoses and all orders for this visit:  Dizziness and giddiness -     POCT CBC -     IFOBT POC (occult bld, rslt in office) -     POCT urinalysis dipstick -     POCT Microscopic Urinalysis ([facility]) -     Comprehensive metabolic panel   I am having Ms. Costa maintain her b complex  vitamins, calcium-vitamin D, aspirin-acetaminophen-caffeine, meclizine, HYDROcodone-acetaminophen, cephALEXin, pantoprazole, ipratropium, cephALEXin, and phenazopyridine.  No orders of the defined types were placed in this encounter.    Appropriate red flag conditions were discussed with the patient as well as actions that should be taken.  Patient expressed his understanding.  Follow-up: No Follow-up on file.  Roselee Culver, MD   Results for orders placed or performed in visit on 11/25/15  POCT CBC  Result Value Ref Range   WBC 5.0 4.6 - 10.2 K/uL   Lymph, poc 1.6 0.6 - 3.4   POC LYMPH PERCENT 31.9 10 - 50 %L   MID (cbc) 0.3 0 - 0.9   POC MID % 6.2 0 - 12 %M   POC Granulocyte 3.1 2 - 6.9   Granulocyte percent 61.9 37 - 80 %G   RBC 4.49 4.04 - 5.48 M/uL   Hemoglobin 13.9 12.2 - 16.2 g/dL   HCT, POC 41.5 37.7 - 47.9 %   MCV 92.5 80 - 97 fL   MCH,  POC 31.0 27 - 31.2 pg   MCHC 33.5 31.8 - 35.4 g/dL   RDW, POC 13.6 %   Platelet Count, POC 247 142 - 424 K/uL   MPV 7.7 0 - 99.8 fL  IFOBT POC (occult bld, rslt in office)  Result Value Ref Range   IFOBT Negative   POCT urinalysis dipstick  Result Value Ref Range   Color, UA yellow yellow   Clarity, UA clear clear   Glucose, UA negative negative   Bilirubin, UA negative negative   Ketones, POC UA negative negative   Spec Grav, UA 1.020    Blood, UA trace-lysed (A) negative   pH, UA 5.0    Protein Ur, POC negative negative   Urobilinogen, UA 0.2    Nitrite, UA Negative Negative   Leukocytes, UA Negative Negative  POCT Microscopic Urinalysis ([facility])  Result Value Ref Range   WBC,UR,HPF,POC Few (A) None WBC/hpf   RBC,UR,HPF,POC None None RBC/hpf   Bacteria None None, Too numerous to count   Mucus Absent Absent   Epithelial Cells, UR Per Microscopy Few (A) None, Too numerous to count cells/hpf

## 2015-11-25 NOTE — Patient Instructions (Signed)
Benign Positional Vertigo Vertigo is the feeling that you or your surroundings are moving when they are not. Benign positional vertigo is the most common form of vertigo. The cause of this condition is not serious (is benign). This condition is triggered by certain movements and positions (is positional). This condition can be dangerous if it occurs while you are doing something that could endanger you or others, such as driving.  CAUSES In many cases, the cause of this condition is not known. It may be caused by a disturbance in an area of the inner ear that helps your brain to sense movement and balance. This disturbance can be caused by a viral infection (labyrinthitis), head injury, or repetitive motion. RISK FACTORS This condition is more likely to develop in:  Women.  People who are 50 years of age or older. SYMPTOMS Symptoms of this condition usually happen when you move your head or your eyes in different directions. Symptoms may start suddenly, and they usually last for less than a minute. Symptoms may include:  Loss of balance and falling.  Feeling like you are spinning or moving.  Feeling like your surroundings are spinning or moving.  Nausea and vomiting.  Blurred vision.  Dizziness.  Involuntary eye movement (nystagmus). Symptoms can be mild and cause only slight annoyance, or they can be severe and interfere with daily life. Episodes of benign positional vertigo may return (recur) over time, and they may be triggered by certain movements. Symptoms may improve over time. DIAGNOSIS This condition is usually diagnosed by medical history and a physical exam of the head, neck, and ears. You may be referred to a health care provider who specializes in ear, nose, and throat (ENT) problems (otolaryngologist) or a provider who specializes in disorders of the nervous system (neurologist). You may have additional testing, including:  MRI.  A CT scan.  Eye movement tests. Your  health care provider may ask you to change positions quickly while he or she watches you for symptoms of benign positional vertigo, such as nystagmus. Eye movement may be tested with an electronystagmogram (ENG), caloric stimulation, the Dix-Hallpike test, or the roll test.  An electroencephalogram (EEG). This records electrical activity in your brain.  Hearing tests. TREATMENT Usually, your health care provider will treat this by moving your head in specific positions to adjust your inner ear back to normal. Surgery may be needed in severe cases, but this is rare. In some cases, benign positional vertigo may resolve on its own in 2-4 weeks. HOME CARE INSTRUCTIONS Safety  Move slowly.Avoid sudden body or head movements.  Avoid driving.  Avoid operating heavy machinery.  Avoid doing any tasks that would be dangerous to you or others if a vertigo episode would occur.  If you have trouble walking or keeping your balance, try using a cane for stability. If you feel dizzy or unstable, sit down right away.  Return to your normal activities as told by your health care provider. Ask your health care provider what activities are safe for you. General Instructions  Take over-the-counter and prescription medicines only as told by your health care provider.  Avoid certain positions or movements as told by your health care provider.  Drink enough fluid to keep your urine clear or pale yellow.  Keep all follow-up visits as told by your health care provider. This is important. SEEK MEDICAL CARE IF:  You have a fever.  Your condition gets worse or you develop new symptoms.  Your family or friends   notice any behavioral changes.  Your nausea or vomiting gets worse.  You have numbness or a "pins and needles" sensation. SEEK IMMEDIATE MEDICAL CARE IF:  You have difficulty speaking or moving.  You are always dizzy.  You faint.  You develop severe headaches.  You have weakness in your  legs or arms.  You have changes in your hearing or vision.  You develop a stiff neck.  You develop sensitivity to light.   This information is not intended to replace advice given to you by your health care provider. Make sure you discuss any questions you have with your health care provider.   Document Released: 08/23/2006 Document Revised: 08/06/2015 Document Reviewed: 03/10/2015 Elsevier Interactive Patient Education 2016 [employer]  

## 2015-12-03 IMAGING — CR DG ELBOW COMPLETE 3+V*L*
3 series · 3 of 3 positions shown · non-contrast
Comparison: 06/03/2015.

CLINICAL DATA: Radial fracture.

EXAM:
LEFT ELBOW - COMPLETE 3+ VIEW

[AP]
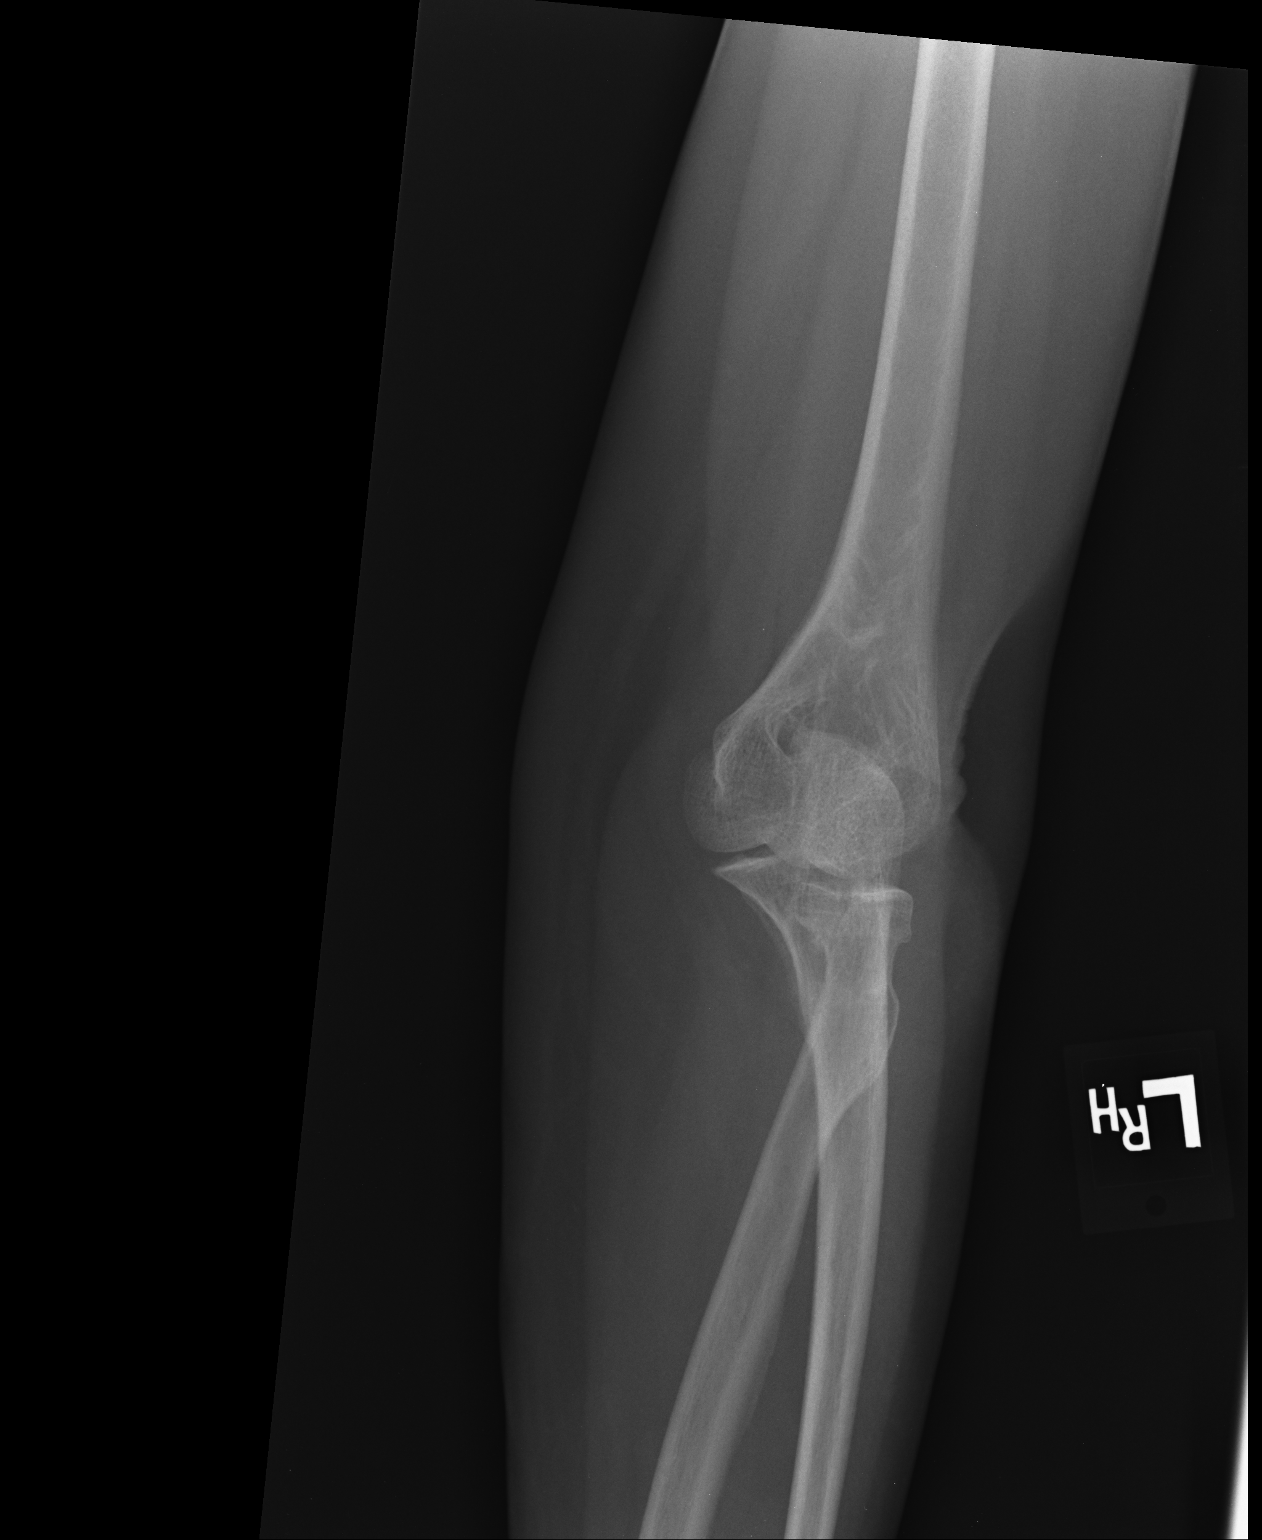

[oblique]
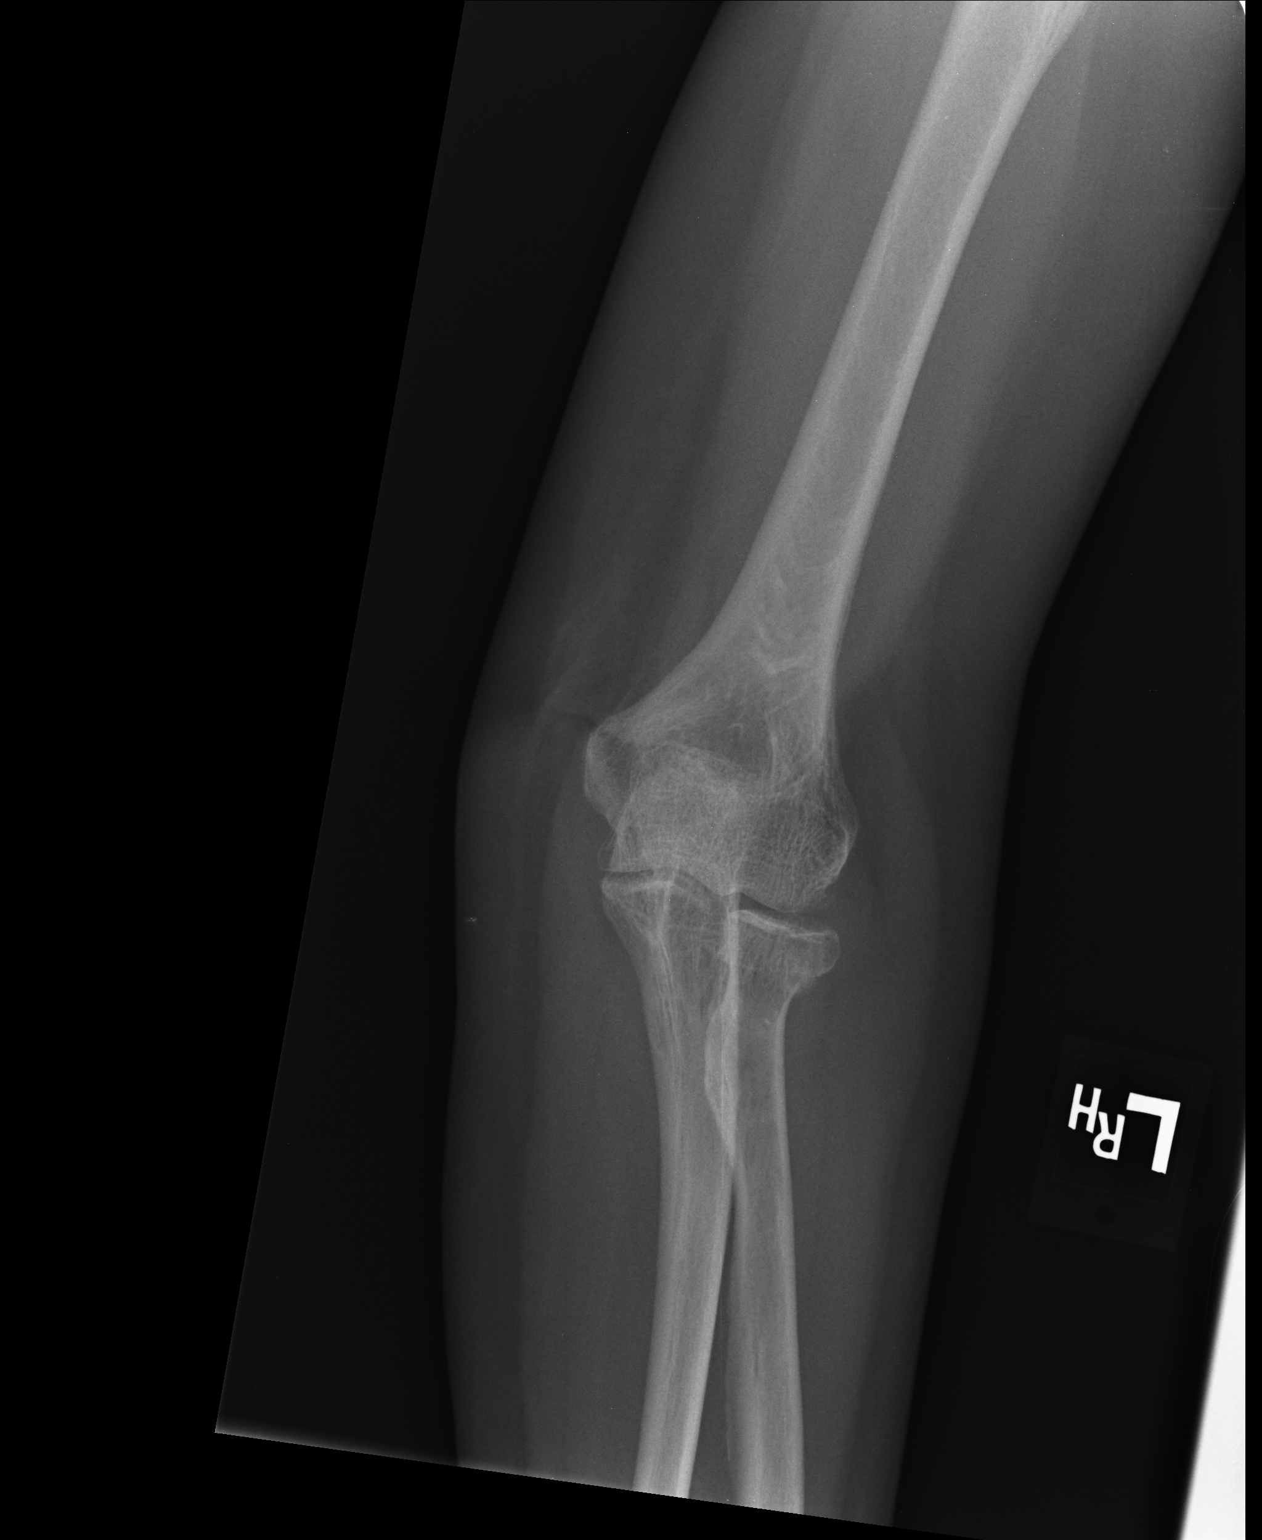

[lateral]
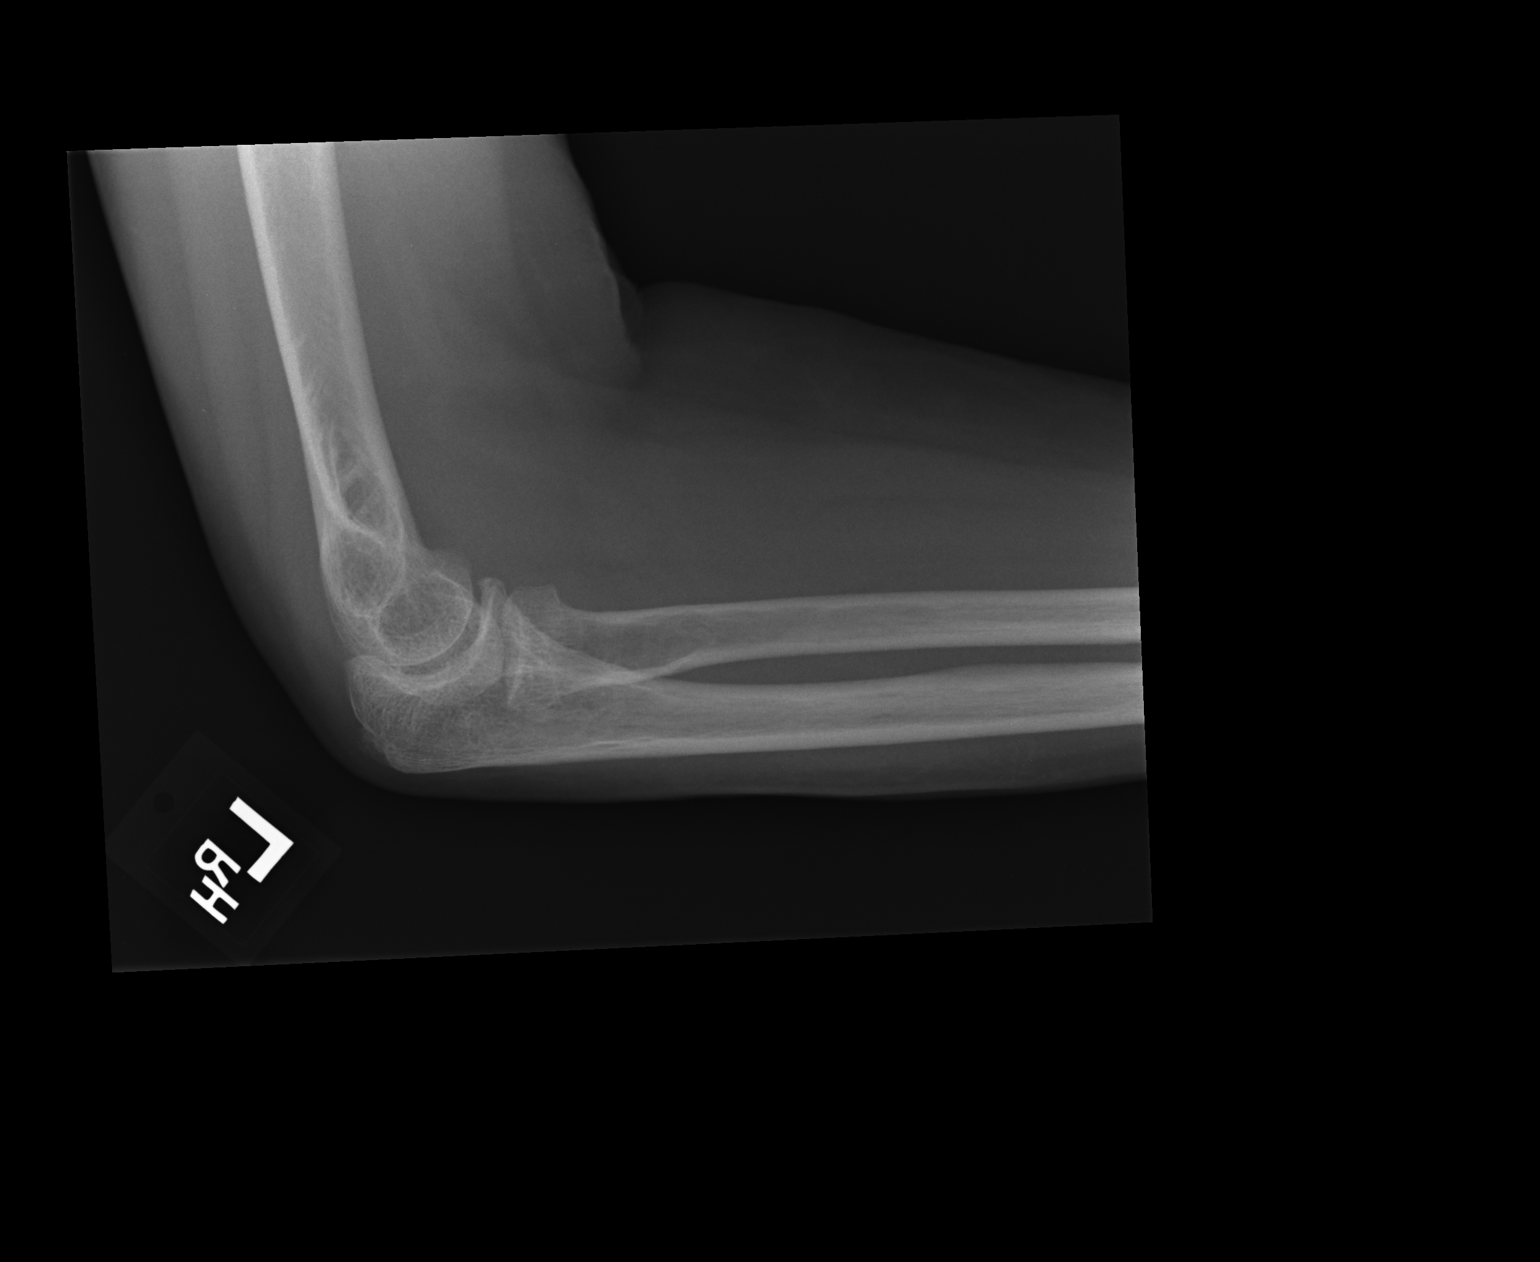

[3 of 3 positions shown; findings below may reference images not displayed]

FINDINGS: Healing fracture of the left radial head and neck is again noted.
Callus formation is noted. No evidence displacement.
IMPRESSION: Continued healing of proximal left radial fracture. No evidence of
displacement. No new abnormality identified.

## 2016-04-06 ENCOUNTER — Ambulatory Visit (INDEPENDENT_AMBULATORY_CARE_PROVIDER_SITE_OTHER): Payer: BLUE CROSS/BLUE SHIELD

## 2016-04-06 ENCOUNTER — Ambulatory Visit (INDEPENDENT_AMBULATORY_CARE_PROVIDER_SITE_OTHER): Payer: BLUE CROSS/BLUE SHIELD | Admitting: Family Medicine

## 2016-04-06 VITALS — BP 110/72 | HR 74 | Temp 98.0°F | Resp 18 | Ht 65.0 in | Wt 161.0 lb

## 2016-04-06 DIAGNOSIS — Z5181 Encounter for therapeutic drug level monitoring: Secondary | ICD-10-CM | POA: Diagnosis not present

## 2016-04-06 DIAGNOSIS — M546 Pain in thoracic spine: Secondary | ICD-10-CM | POA: Diagnosis not present

## 2016-04-06 DIAGNOSIS — E7800 Pure hypercholesterolemia, unspecified: Secondary | ICD-10-CM | POA: Diagnosis not present

## 2016-04-06 DIAGNOSIS — M899 Disorder of bone, unspecified: Secondary | ICD-10-CM | POA: Diagnosis not present

## 2016-04-06 DIAGNOSIS — M25562 Pain in left knee: Secondary | ICD-10-CM

## 2016-04-06 DIAGNOSIS — M949 Disorder of cartilage, unspecified: Secondary | ICD-10-CM | POA: Diagnosis not present

## 2016-04-06 DIAGNOSIS — M5137 Other intervertebral disc degeneration, lumbosacral region: Secondary | ICD-10-CM

## 2016-04-06 DIAGNOSIS — M25561 Pain in right knee: Secondary | ICD-10-CM | POA: Diagnosis not present

## 2016-04-06 LAB — COMPREHENSIVE METABOLIC PANEL
ALT: 18 U/L (ref 6–29)
AST: 15 U/L (ref 10–35)
Albumin: 4.3 g/dL (ref 3.6–5.1)
Alkaline Phosphatase: 58 U/L (ref 33–130)
BUN: 15 mg/dL (ref 7–25)
CALCIUM: 9.3 mg/dL (ref 8.6–10.4)
CHLORIDE: 104 mmol/L (ref 98–110)
CO2: 25 mmol/L (ref 20–31)
Creat: 0.72 mg/dL (ref 0.50–0.99)
GLUCOSE: 92 mg/dL (ref 65–99)
POTASSIUM: 4.6 mmol/L (ref 3.5–5.3)
Sodium: 139 mmol/L (ref 135–146)
Total Bilirubin: 0.5 mg/dL (ref 0.2–1.2)
Total Protein: 7.2 g/dL (ref 6.1–8.1)

## 2016-04-06 LAB — LIPID PANEL
CHOL/HDL RATIO: 4.3 ratio (ref <=5.0)
CHOLESTEROL: 262 mg/dL — AB (ref 125–200)
HDL: 61 mg/dL (ref >=46)
LDL CALC: 178 mg/dL — AB (ref <130)
TRIGLYCERIDES: 114 mg/dL (ref <150)
VLDL: 23 mg/dL (ref <30)

## 2016-04-06 MED ORDER — METHOCARBAMOL 500 MG PO TABS: 500.0000 mg | ORAL_TABLET | Freq: Every evening | ORAL | Status: DC | PRN

## 2016-04-06 MED ORDER — MELOXICAM 7.5 MG PO TABS: 7.5000 mg | ORAL_TABLET | Freq: Two times a day (BID) | ORAL | Status: DC | PRN

## 2016-04-06 NOTE — Patient Instructions (Addendum)
IF you received an x-ray today, you will receive an invoice from [facility]. Please contact [facility] at [phone] with questions or concerns regarding your invoice.   IF you received labwork today, you will receive an invoice from [employer]. Please contact [facility] at [phone] with questions or concerns regarding your invoice.   Our billing staff will not be able to assist you with questions regarding bills from these companies.  You will be contacted with the lab results as soon as they are available. The fastest way to get your results is to activate your My Chart account. Instructions are located on the last page of this paperwork. If you have not heard from [place] regarding the results in 2 weeks, please contact this office.    Thoracic Strain Thoracic strain is an injury to the muscles or tendons that attach to the upper back. A strain can be mild or severe. A mild strain may take only 1-2 weeks to heal. A severe strain involves torn muscles or tendons, so it may take 6-8 weeks to heal. HOME CARE 1. Rest as needed. Limit your activity as told by your doctor. 2. If directed, put ice on the injured area: 1. Put ice in a plastic bag. 2. Place a towel between your skin and the bag. 3. Leave the ice on for 20 minutes, 2-3 times per day. 3. Take over-the-counter and prescription medicines only as told by your doctor. 4. Begin doing exercises as told by your doctor or physical therapist. 5. Warm up before being active. 6. Bend your knees before you lift heavy objects. 7. Keep all follow-up visits as told by your doctor. This is important. GET HELP IF: 1. Your pain is not helped by medicine. 2. Your pain, bruising, or swelling is getting worse. 3. You have a fever. GET HELP RIGHT AWAY IF: 1. You have shortness of breath. 2. You have chest pain. 3. You have weakness or loss of feeling (numbness) in your legs. 4. You cannot control  when you pee (urinate).   This information is not intended to replace advice given to you by your health care provider. Make sure you discuss any questions you have with your health care provider.   Document Released: 05/03/2008 Document Revised: 08/06/2015 Document Reviewed: 01/09/2015 Elsevier Interactive Patient Education 2016 [place]. Back Exercises If you have pain in your back, do these exercises 2-3 times each day or as told by your doctor. When the pain goes away, do the exercises once each day, but repeat the steps more times for each exercise (do more repetitions). If you do not have pain in your back, do these exercises once each day or as told by your doctor. EXERCISES Single Knee to Chest Do these steps 3-5 times in a row for each leg: 8. Lie on your back on a firm bed or the floor with your legs stretched out. 9. Bring one knee to your chest. 10. Hold your knee to your chest by grabbing your knee or thigh. 11. Pull on your knee until you feel a gentle stretch in your lower back. 12. Keep doing the stretch for 10-30 seconds. 13. Slowly let go of your leg and straighten it. Pelvic Tilt Do these steps 5-10 times in a row: 4. Lie on your back on a firm bed or the floor with your legs stretched out. 5. Bend your knees so they point up to the ceiling. Your feet should be flat on the  floor. 6. Tighten your lower belly (abdomen) muscles to press your lower back against the floor. This will make your tailbone point up to the ceiling instead of pointing down to your feet or the floor. 7. Stay in this position for 5-10 seconds while you gently tighten your muscles and breathe evenly. Cat-Cow Do these steps until your lower back bends more easily: 5. Get on your hands and knees on a firm surface. Keep your hands under your shoulders, and keep your knees under your hips. You may put padding under your knees. 6. Let your head hang down, and make your tailbone point down to the floor  so your lower back is round like the back of a cat. 7. Stay in this position for 5 seconds. 8. Slowly lift your head and make your tailbone point up to the ceiling so your back hangs low (sags) like the back of a cow. 9. Stay in this position for 5 seconds. Press-Ups Do these steps 5-10 times in a row: 1. Lie on your belly (face-down) on the floor. 2. Place your hands near your head, about shoulder-width apart. 3. While you keep your back relaxed and keep your hips on the floor, slowly straighten your arms to raise the top half of your body and lift your shoulders. Do not use your back muscles. To make yourself more comfortable, you may change where you place your hands. 4. Stay in this position for 5 seconds. 5. Slowly return to lying flat on the floor. Bridges Do these steps 10 times in a row: 1. Lie on your back on a firm surface. 2. Bend your knees so they point up to the ceiling. Your feet should be flat on the floor. 3. Tighten your butt muscles and lift your butt off of the floor until your waist is almost as high as your knees. If you do not feel the muscles working in your butt and the back of your thighs, slide your feet 1-2 inches farther away from your butt. 4. Stay in this position for 3-5 seconds. 5. Slowly lower your butt to the floor, and let your butt muscles relax. If this exercise is too easy, try doing it with your arms crossed over your chest. Belly Crunches Do these steps 5-10 times in a row: 1. Lie on your back on a firm bed or the floor with your legs stretched out. 2. Bend your knees so they point up to the ceiling. Your feet should be flat on the floor. 3. Cross your arms over your chest. 4. Tip your chin a little bit toward your chest but do not bend your neck. 5. Tighten your belly muscles and slowly raise your chest just enough to lift your shoulder blades a tiny bit off of the floor. 6. Slowly lower your chest and your head to the floor. Back Lifts Do these  steps 5-10 times in a row: 1. Lie on your belly (face-down) with your arms at your sides, and rest your forehead on the floor. 2. Tighten the muscles in your legs and your butt. 3. Slowly lift your chest off of the floor while you keep your hips on the floor. Keep the back of your head in line with the curve in your back. Look at the floor while you do this. 4. Stay in this position for 3-5 seconds. 5. Slowly lower your chest and your face to the floor. GET HELP IF:  Your back pain gets a lot worse when you do  an exercise.  Your back pain does not lessen 2 hours after you exercise. If you have any of these problems, stop doing the exercises. Do not do them again unless your doctor says it is okay. GET HELP RIGHT AWAY IF:  You have sudden, very bad back pain. If this happens, stop doing the exercises. Do not do them again unless your doctor says it is okay.   This information is not intended to replace advice given to you by your health care provider. Make sure you discuss any questions you have with your health care provider.   Document Released: 12/18/2010 Document Revised: 08/06/2015 Document Reviewed: 01/09/2015 Elsevier Interactive Patient Education [employer].

## 2016-04-06 NOTE — Progress Notes (Signed)
By signing my name below I, Lisa Costa, attest that this documentation has been prepared under the direction and in the presence of Lisa Cheadle, MD. Electonically Signed. Lisa Costa, Scribe 04/06/2016 at 10:28 AM  Subjective:    [name], female    DOB: [birth year], 65 y.o.   MRN: [id number]  Chief Complaint  Patient presents with  . Spine Injury  . BLOODWORK    HPI Lisa Costa [facility] is a 65 y.o. female who presents to the [facility] complaining of back pain. Pain is midline from top of T-spine to L-spine and has lasted for the past month and a half. Pt denies any injury, accidents, or strain. Pt has used a topical cream of unknown type with no relief. Pt states that her hands have been hurting her bilat and are stiff in the morning.  Saw chiropractor who told pt she has a lumbar disc problem.  Pt also c/o itchiness on her posterior neck 3 days ago. Pt used cortisol cream with relief.  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  Pt walks for exercise and does not stretch.   Pt is also requesting to have her blood sugar and cholesterol tested today. Pt has not eaten today.    Patient Active Problem List   Diagnosis Date Noted  . ALLERGIC RHINITIS 08/27/2010  . STRESS INCONTINENCE 12/22/2009  . HYPERCHOLESTEROLEMIA 03/24/2009  . LUNG NODULE 01/03/2009  . TB SKIN TEST, POSITIVE 01/02/2009  . MICROSCOPIC HEMATURIA 12/31/2008  . VAGINAL MASS 12/05/2008  . ANXIETY DEPRESSION 07/22/2008  . OSTEOPENIA 05/07/2008  . DECREASED HEARING 05/06/2008  . DENTAL CARIES 10/03/2007  . GERD 10/03/2007   Current Outpatient Prescriptions on File Prior to Visit  Medication Sig Dispense Refill  . aspirin-acetaminophen-caffeine (EXCEDRIN MIGRAINE) 250-250-65 MG per tablet Take 2 tablets by mouth every 6 (six) hours as needed. Reported on 11/25/2015    . b complex vitamins tablet Take 1 tablet by mouth daily.      .  calcium-vitamin D (OSCAL) 250-125 MG-UNIT per tablet Take 1 tablet by mouth daily. Reported on 11/25/2015    . cephALEXin (KEFLEX) 500 MG capsule Take 1 capsule (500 mg total) by mouth 2 (two) times daily. 14 capsule 0  . cephALEXin (KEFLEX) 500 MG capsule Take 1 capsule (500 mg total) by mouth 3 (three) times daily. 30 capsule 0  . HYDROcodone-acetaminophen (NORCO/VICODIN) 5-325 MG per tablet Take 1 tablet by mouth every 8 (eight) hours as needed. 20 tablet 0  . ipratropium (ATROVENT) 0.03 % nasal spray Place 2 sprays into the nose 4 (four) times daily. 30 mL 6  . meclizine (ANTIVERT) 12.5 MG tablet Take 1 tablet (12.5 mg total) by mouth 3 (three) times daily as needed for dizziness. 30 tablet 0  . pantoprazole (PROTONIX) 40 MG tablet Take 1 tablet (40 mg total) by mouth daily. 90 tablet 3  . phenazopyridine (PYRIDIUM) 200 MG tablet Take 1 tablet (200 mg total) by mouth 3 (three) times daily as needed for pain. 10 tablet 0   No current facility-administered medications on file prior to visit.   Allergies  Allergen Reactions  . Tramadol Nausea And Vomiting   Depression screen [facility] 2/9 04/06/2016 08/22/2015 07/04/2015 06/16/2015 05/13/2015  Decreased Interest 0 0 1 0 0  Down, Depressed, Hopeless 0 0 0 0 0  PHQ - 2 Score 0 0 1 0 0       Review  of Systems  Constitutional: Negative for fever.  HENT: Negative for congestion.   Eyes: Negative for visual disturbance.  Respiratory: Negative for cough.   Cardiovascular: Negative for chest pain.  Genitourinary: Negative for dysuria.  Musculoskeletal: Positive for back pain.  Skin: Negative for rash.  Neurological: Negative for headaches.  Psychiatric/Behavioral: Negative for agitation.       Objective:  BP 110/72 mmHg  Pulse 74  Temp(Src) 98 F (36.7 C) (Oral)  Resp 18  Ht 5\' 5"  (1.651 m)  Wt 161 lb (73.029 kg)  BMI 26.79 kg/m2  SpO2 97%  Physical Exam  Constitutional: She is oriented to person, place, and time. She appears  well-developed and well-nourished. No distress.  HENT:  Head: Normocephalic and atraumatic.  Eyes: Conjunctivae are normal. Pupils are equal, round, and reactive to light.  Neck: Neck supple.  Cardiovascular: Normal rate.   Pulmonary/Chest: Effort normal.  Musculoskeletal:       Right knee: She exhibits normal range of motion (with mild crepitus).       Left knee: She exhibits normal range of motion (with mild crepitus).  Pt has 5/5 strength in bilat upper extremities.  Pt has full ROM with flexion, extension, rotation of her spine. Pt has multiple points of tenderness along her midline thoracic spine. Pt also has muscle spasms along bilat rhomboids and low paraspinal muscles.   Neurological: She is alert and oriented to person, place, and time.  Reflex Scores:      Tricep reflexes are 2+ on the right side and 2+ on the left side.      Bicep reflexes are 2+ on the right side and 2+ on the left side.      Patellar reflexes are 2+ on the right side and 2+ on the left side. Skin: Skin is warm and dry.  Psychiatric: She has a normal mood and affect. Her behavior is normal.  Nursing note and vitals reviewed.  Dg Thoracic Spine 2 View  04/06/2016  CLINICAL DATA:  Point tenderness over the thoracic spine for the past 6 weeks without known injury EXAM: THORACIC SPINE 2 VIEWS COMPARISON:  None in PACs FINDINGS: The thoracic vertebral bodies are preserved in height. The disc space heights are reasonably well-maintained. The pedicles are intact. There are no abnormal paravertebral soft tissue densities. The observed portions of the spinous processes and the posterior ribs are unremarkable. IMPRESSION: No acute bony abnormality of the thoracic spine is observed. Electronically Signed   By: David  Martinique M.D.   On: 04/06/2016 09:45        Assessment & Plan:  Pt was on pravastatin 40mg  and stopped December 2016.  No problems, pt just wanted to try not taking it. Pt transferred primary care to our  office.  1. Midline thoracic back pain   2. Knee pain, bilateral   3. DDD (degenerative disc disease), lumbosacral   4. HYPERCHOLESTEROLEMIA   5. Disorder of bone and cartilage   6. Medication monitoring encounter     Orders Placed This Encounter  Procedures  . DG Thoracic Spine 2 View    Standing Status: Future     Number of Occurrences: 1     Standing Expiration Date: 04/06/2017    Order Specific Question:  Reason for Exam (SYMPTOM  OR DIAGNOSIS REQUIRED)    Answer:  point tenderness throughout x 1 mo, no injury or h/o prior    Order Specific Question:  Preferred imaging location?    Answer:  External  . Lipid  panel    Order Specific Question:  Has the patient fasted?    Answer:  Yes  . Comprehensive metabolic panel    Order Specific Question:  Has the patient fasted?    Answer:  Yes    Meds ordered this encounter  Medications  . metoprolol tartrate (LOPRESSOR) 25 MG tablet    Sig: Take 25 mg by mouth 2 (two) times daily.  . meloxicam (MOBIC) 7.5 MG tablet    Sig: Take 1 tablet (7.5 mg total) by mouth 2 (two) times daily as needed for pain.    Dispense:  60 tablet    Refill:  1  . methocarbamol (ROBAXIN) 500 MG tablet    Sig: Take 1 tablet (500 mg total) by mouth at bedtime as needed and may repeat dose one time if needed for muscle spasms.    Dispense:  60 tablet    Refill:  0    I personally performed the services described in this documentation, which was scribed in my presence. The recorded information has been reviewed and considered, and addended by me as needed.   Lisa Costa, M.D.  [facility] [address] [place], [place] [address] [phone] phone [phone] fax  04/28/2016 6:50 AM

## 2016-04-12 ENCOUNTER — Encounter: Payer: Self-pay | Admitting: Family Medicine

## 2016-11-01 ENCOUNTER — Ambulatory Visit (INDEPENDENT_AMBULATORY_CARE_PROVIDER_SITE_OTHER): Payer: BLUE CROSS/BLUE SHIELD | Admitting: Family Medicine

## 2016-11-01 VITALS — BP 116/76 | HR 65 | Temp 98.1°F | Resp 17 | Ht 65.0 in | Wt 155.0 lb

## 2016-11-01 DIAGNOSIS — K2100 Gastro-esophageal reflux disease with esophagitis, without bleeding: Secondary | ICD-10-CM

## 2016-11-01 DIAGNOSIS — E7800 Pure hypercholesterolemia, unspecified: Secondary | ICD-10-CM

## 2016-11-01 DIAGNOSIS — R2689 Other abnormalities of gait and mobility: Secondary | ICD-10-CM

## 2016-11-01 DIAGNOSIS — H8111 Benign paroxysmal vertigo, right ear: Secondary | ICD-10-CM | POA: Diagnosis not present

## 2016-11-01 DIAGNOSIS — N3000 Acute cystitis without hematuria: Secondary | ICD-10-CM

## 2016-11-01 DIAGNOSIS — Z113 Encounter for screening for infections with a predominantly sexual mode of transmission: Secondary | ICD-10-CM | POA: Diagnosis not present

## 2016-11-01 DIAGNOSIS — R300 Dysuria: Secondary | ICD-10-CM

## 2016-11-01 DIAGNOSIS — R5383 Other fatigue: Secondary | ICD-10-CM | POA: Diagnosis not present

## 2016-11-01 DIAGNOSIS — H8112 Benign paroxysmal vertigo, left ear: Secondary | ICD-10-CM | POA: Diagnosis not present

## 2016-11-01 DIAGNOSIS — Z01411 Encounter for gynecological examination (general) (routine) with abnormal findings: Secondary | ICD-10-CM

## 2016-11-01 DIAGNOSIS — Z23 Encounter for immunization: Secondary | ICD-10-CM

## 2016-11-01 DIAGNOSIS — M25522 Pain in left elbow: Secondary | ICD-10-CM | POA: Diagnosis not present

## 2016-11-01 DIAGNOSIS — E782 Mixed hyperlipidemia: Secondary | ICD-10-CM | POA: Diagnosis not present

## 2016-11-01 DIAGNOSIS — R1084 Generalized abdominal pain: Secondary | ICD-10-CM

## 2016-11-01 DIAGNOSIS — Z124 Encounter for screening for malignant neoplasm of cervix: Secondary | ICD-10-CM

## 2016-11-01 LAB — POCT URINALYSIS DIP (MANUAL ENTRY)
BILIRUBIN UA: NEGATIVE
Bilirubin, UA: NEGATIVE
GLUCOSE UA: NEGATIVE
Nitrite, UA: NEGATIVE
Protein Ur, POC: NEGATIVE
SPEC GRAV UA: 1.025
Urobilinogen, UA: 0.2
pH, UA: 5

## 2016-11-01 LAB — POCT CBC
Granulocyte percent: 65.6 %G (ref 37–80)
HCT, POC: 41.8 % (ref 37.7–47.9)
HEMOGLOBIN: 14.5 g/dL (ref 12.2–16.2)
Lymph, poc: 2.1 (ref 0.6–3.4)
MCH: 32.2 pg — AB (ref 27–31.2)
MCHC: 34.7 g/dL (ref 31.8–35.4)
MCV: 92.7 fL (ref 80–97)
MID (cbc): 0.3 (ref 0–0.9)
MPV: 8.4 fL (ref 0–99.8)
PLATELET COUNT, POC: 254 10*3/uL (ref 142–424)
POC Granulocyte: 4.6 (ref 2–6.9)
POC LYMPH PERCENT: 30.7 %L (ref 10–50)
POC MID %: 3.7 %M (ref 0–12)
RBC: 4.52 M/uL (ref 4.04–5.48)
RDW, POC: 13.2 %
WBC: 7 10*3/uL (ref 4.6–10.2)

## 2016-11-01 LAB — POCT WET + KOH PREP
Trich by wet prep: ABSENT
Yeast by KOH: ABSENT
Yeast by wet prep: ABSENT

## 2016-11-01 LAB — POC MICROSCOPIC URINALYSIS (UMFC): Mucus: ABSENT

## 2016-11-01 MED ORDER — ZOSTER VACCINE LIVE 19400 UNT/0.65ML ~~LOC~~ SUSR: 0.6500 mL | Freq: Once | SUBCUTANEOUS | 0 refills | Status: AC

## 2016-11-01 MED ORDER — AZELASTINE HCL 0.05 % OP SOLN: 1.0000 [drp] | Freq: Two times a day (BID) | OPHTHALMIC | 12 refills | Status: DC

## 2016-11-01 MED ORDER — METOPROLOL TARTRATE 25 MG PO TABS: 25.0000 mg | ORAL_TABLET | Freq: Two times a day (BID) | ORAL | 3 refills | Status: DC

## 2016-11-01 MED ORDER — HYDROCODONE-ACETAMINOPHEN 5-325 MG PO TABS: 1.0000 | ORAL_TABLET | Freq: Three times a day (TID) | ORAL | 0 refills | Status: DC | PRN

## 2016-11-01 MED ORDER — METHOCARBAMOL 500 MG PO TABS: 500.0000 mg | ORAL_TABLET | Freq: Every evening | ORAL | 0 refills | Status: DC | PRN

## 2016-11-01 MED ORDER — PANTOPRAZOLE SODIUM 40 MG PO TBEC: 40.0000 mg | DELAYED_RELEASE_TABLET | Freq: Every day | ORAL | 3 refills | Status: DC

## 2016-11-01 NOTE — Progress Notes (Signed)
Subjective:  By signing my name below, I, Lisa Costa, attest that this documentation has been prepared under the direction and in the presence of Delman Cheadle, MD Electronically Signed: Ladene Artist, ED Scribe 11/01/2016 at 3:16 PM.   [name], female    DOB: 1951/05/26, 65 y.o.   MRN: [id number]  Chief Complaint  Patient presents with  . Dysuria  . Medication Refill    Everything   HPI HPI Comments: Lisa Costa is a 65 y.o. female who presents to the [facility] complaining of dysuria for the past 3 weeks. Pt reports associated symptoms of urinary frequency, nausea, epigastric and left flank pain onset yesterday that has resolved, increased flatulence, abdominal distention, abdominal discomfort onset yesterday and ongoing fatigue for the past month. Pt states that she has been getting up at least 4-5 times per night to urinate. No medications tried PTA. Pt denies fever, chills, emesis, heartburn, indigestion, constipation. She is takes Vitamin B-12 but reports low energy if she misses a dose. Pt also takes fish oil, 1 dose of calcium and Vitamin D daily. Pt has not received a flu vaccine this season but agrees to receive it today. Pt does not currently have a GYN due to normal pap smears.   Pt also states that she has noticed dry skin and hair lost. Pt denies h/o thyroid disease.    Past Medical History:  Diagnosis Date  . GERD (gastroesophageal reflux disease)   . Heart murmur   . Hypercholesteremia   . Left radial head fracture   . Osteoporosis   . Thyroid disease 2010   Positive skin test/negative chest xray   Current Outpatient Prescriptions on File Prior to Visit  Medication Sig Dispense Refill  . aspirin-acetaminophen-caffeine (EXCEDRIN MIGRAINE) 250-250-65 MG per tablet Take 2 tablets by mouth every 6 (six) hours as needed. Reported on 11/25/2015    . b complex vitamins tablet Take 1 tablet by mouth daily.      .  calcium-vitamin D (OSCAL) 250-125 MG-UNIT per tablet Take 1 tablet by mouth daily. Reported on 11/25/2015    . HYDROcodone-acetaminophen (NORCO/VICODIN) 5-325 MG per tablet Take 1 tablet by mouth every 8 (eight) hours as needed. 20 tablet 0  . ipratropium (ATROVENT) 0.03 % nasal spray Place 2 sprays into the nose 4 (four) times daily. 30 mL 6  . meclizine (ANTIVERT) 12.5 MG tablet Take 1 tablet (12.5 mg total) by mouth 3 (three) times daily as needed for dizziness. 30 tablet 0  . meloxicam (MOBIC) 7.5 MG tablet Take 1 tablet (7.5 mg total) by mouth 2 (two) times daily as needed for pain. 60 tablet 1  . methocarbamol (ROBAXIN) 500 MG tablet Take 1 tablet (500 mg total) by mouth at bedtime as needed and may repeat dose one time if needed for muscle spasms. 60 tablet 0  . metoprolol tartrate (LOPRESSOR) 25 MG tablet Take 25 mg by mouth 2 (two) times daily.    . pantoprazole (PROTONIX) 40 MG tablet Take 1 tablet (40 mg total) by mouth daily. 90 tablet 3  . phenazopyridine (PYRIDIUM) 200 MG tablet Take 1 tablet (200 mg total) by mouth 3 (three) times daily as needed for pain. 10 tablet 0   No current facility-administered medications on file prior to visit.    Allergies  Allergen Reactions  . Tramadol Nausea And Vomiting   Review of Systems  Constitutional: Positive for fatigue. Negative for chills and fever.  Gastrointestinal:  Positive for abdominal distention, abdominal pain (resolved) and nausea. Negative for constipation and vomiting.  Genitourinary: Positive for dysuria, flank pain and frequency.      Objective:   Physical Exam  Constitutional: She is oriented to person, place, and time. She appears well-developed and well-nourished. No distress.  HENT:  Head: Normocephalic and atraumatic.  Right Ear: Tympanic membrane is injected. A middle ear effusion is present.  Left Ear: Tympanic membrane normal.  Nose: Nose normal.  Mouth/Throat: Oropharynx is clear and moist and mucous membranes  are normal.  Eyes: Conjunctivae and EOM are normal.  Neck: Neck supple. No tracheal deviation present. No thyroid mass and no thyromegaly present.  Cardiovascular: Normal rate, regular rhythm, S1 normal, S2 normal and normal heart sounds.   Pulmonary/Chest: Effort normal and breath sounds normal. No respiratory distress.  Abdominal: Bowel sounds are normal. She exhibits no distension and no mass. There is generalized tenderness. There is guarding (epigastric and suprapubic). There is no CVA tenderness.  Genitourinary: Cervix exhibits friability (mild).  Genitourinary Comments: Chaperone present. Small pea-sized cyst in L labia minora. Varicose veins in L labia majora. Cervix and vaginal vault had irritation. Mildly friable.   Musculoskeletal: Normal range of motion.  Lymphadenopathy:       Head (left side): Tonsillar adenopathy present.  Neurological: She is alert and oriented to person, place, and time.  Skin: Skin is warm and dry.  Psychiatric: She has a normal mood and affect. Her behavior is normal.  Nursing note and vitals reviewed.  BP 116/76 (BP Location: Right Arm, Patient Position: Sitting, Cuff Size: Large)   Pulse 65   Temp 98.1 F (36.7 C) (Oral)   Resp 17   Ht 5\' 5"  (1.651 m)   Wt 155 lb (70.3 kg)   SpO2 98%   BMI 25.79 kg/m     Results for orders placed or performed in visit on 11/01/16  Urine culture  Result Value Ref Range   Urine Culture, Routine Final report (A)    Urine Culture result 1 Escherichia coli (A)    ANTIMICROBIAL SUSCEPTIBILITY Comment   Comprehensive metabolic panel  Result Value Ref Range   Glucose 76 65 - 99 mg/dL   BUN 14 8 - 27 mg/dL   Creatinine, Ser 0.69 0.57 - 1.00 mg/dL   GFR calc non Af Amer 91 >59 mL/min/1.73   GFR calc Af Amer 105 >59 mL/min/1.73   BUN/Creatinine Ratio 20 12 - 28   Sodium 141 134 - 144 mmol/L   Potassium 4.3 3.5 - 5.2 mmol/L   Chloride 100 96 - 106 mmol/L   CO2 22 18 - 29 mmol/L   Calcium 9.5 8.7 - 10.3 mg/dL    Total Protein 7.1 6.0 - 8.5 g/dL   Albumin 4.7 3.6 - 4.8 g/dL   Globulin, Total 2.4 1.5 - 4.5 g/dL   Albumin/Globulin Ratio 2.0 1.2 - 2.2   Bilirubin Total 0.4 0.0 - 1.2 mg/dL   Alkaline Phosphatase 72 39 - 117 IU/L   AST 20 0 - 40 IU/L   ALT 23 0 - 32 IU/L  Lipid panel  Result Value Ref Range   Cholesterol, Total 251 (H) 100 - 199 mg/dL   Triglycerides 130 0 - 149 mg/dL   HDL 55 >39 mg/dL   VLDL Cholesterol Cal 26 5 - 40 mg/dL   LDL Calculated 170 (H) 0 - 99 mg/dL   Chol/HDL Ratio 4.6 (H) 0.0 - 4.4 ratio units  TSH  Result Value Ref Range  TSH 1.820 0.450 - 4.500 uIU/mL  Vitamin B12  Result Value Ref Range   Vitamin B-12 >2000 (H) 232 - 1245 pg/mL  VITAMIN D 25 Hydroxy (Vit-D Deficiency, Fractures)  Result Value Ref Range   Vit D, 25-Hydroxy 28.3 (L) 30.0 - 100.0 ng/mL  Hepatitis C Antibody  Result Value Ref Range   Hep C Virus Ab <0.1 0.0 - 0.9 s/co ratio  POCT Microscopic Urinalysis ([facility])  Result Value Ref Range   WBC,UR,HPF,POC Few (A) None WBC/hpf   RBC,UR,HPF,POC None None RBC/hpf   Bacteria None None, Too numerous to count   Mucus Absent Absent   Epithelial Cells, UR Per Microscopy Few (A) None, Too numerous to count cells/hpf  POCT urinalysis dipstick  Result Value Ref Range   Color, UA yellow yellow   Clarity, UA clear clear   Glucose, UA negative negative   Bilirubin, UA negative negative   Ketones, POC UA negative negative   Spec Grav, UA 1.025    Blood, UA trace-intact (A) negative   pH, UA 5.0    Protein Ur, POC negative negative   Urobilinogen, UA 0.2    Nitrite, UA Negative Negative   Leukocytes, UA Trace (A) Negative  POCT CBC  Result Value Ref Range   WBC 7.0 4.6 - 10.2 K/uL   Lymph, poc 2.1 0.6 - 3.4   POC LYMPH PERCENT 30.7 10 - 50 %L   MID (cbc) 0.3 0 - 0.9   POC MID % 3.7 0 - 12 %M   POC Granulocyte 4.6 2 - 6.9   Granulocyte percent 65.6 37 - 80 %G   RBC 4.52 4.04 - 5.48 M/uL   Hemoglobin 14.5 12.2 - 16.2 g/dL   HCT, POC 41.8 37.7 -  47.9 %   MCV 92.7 80 - 97 fL   MCH, POC 32.2 (A) 27 - 31.2 pg   MCHC 34.7 31.8 - 35.4 g/dL   RDW, POC 13.2 %   Platelet Count, POC 254 142 - 424 K/uL   MPV 8.4 0 - 99.8 fL  POCT Wet + KOH Prep  Result Value Ref Range   Yeast by KOH Absent Absent   Yeast by wet prep Absent Absent   WBC by wet prep Few Few   Clue Cells Wet Prep HPF POC None None   Trich by wet prep Absent Absent   Bacteria Wet Prep HPF POC None (A) Few   Epithelial Cells By [employer] Pref ([facility]) Few None, Few, Too numerous to count   RBC,UR,HPF,POC None None RBC/hpf  Pap IG w/ reflex to HPV when ASC-U  Result Value Ref Range   DIAGNOSIS: Comment    Specimen adequacy: Comment    CLINICIAN PROVIDED ICD10: Comment    Performed by: Comment    PAP SMEAR COMMENT .    Note: Comment    Test Methodology Comment    PAP REFLEX: Comment     Assessment & Plan:   1. Dysuria - Despite benign appearing UA, culture was sent due to symptoms without etiology and returned showing a full Escherichia coli infection resistant to tetracycline and trimethoprim sulfamethoxazole and ampicillin. Pt treated with cephalexin 500 twice a day 1 week to which infection is sensitive.  2. Fatigue, unspecified type   3. Imbalance   4. Generalized abdominal pain   5. Mixed hyperlipidemia   6. Pain in left elbow   7. HYPERCHOLESTEROLEMIA   8. Need for prophylactic vaccination and inoculation against influenza   9. Screen for STD (sexually  transmitted disease)   10. Gastroesophageal reflux disease with esophagitis   11. Benign paroxysmal positional vertigo, right   12. BPPV (benign paroxysmal positional vertigo), left   13. Screening for cervical cancer - no additional Pap smears needed. Collected today due to pain of unknown etiology and friability of cervix on bimanual exam.   14. Acute cystitis without hematuria     Orders Placed This Encounter  Procedures  . Urine culture  . Flu Vaccine QUAD 36+ mos IM  . Pneumococcal conjugate vaccine  13-valent IM  . Comprehensive metabolic panel    Order Specific Question:   Has the patient fasted?    Answer:   Yes  . Lipid panel    Order Specific Question:   Has the patient fasted?    Answer:   Yes  . TSH  . Vitamin B12  . VITAMIN D 25 Hydroxy (Vit-D Deficiency, Fractures)  . Hepatitis C Antibody  . Care order/instruction:    AVS and GO    Scheduling Instructions:     AVS and GO  . POCT Microscopic Urinalysis ([facility])  . POCT urinalysis dipstick  . POCT CBC  . POCT Wet + KOH Prep    Meds ordered this encounter  Medications  . azelastine (OPTIVAR) 0.05 % ophthalmic solution    Sig: Place 1 drop into both eyes 2 (two) times daily.    Dispense:  6 mL    Refill:  12  . HYDROcodone-acetaminophen (NORCO/VICODIN) 5-325 MG tablet    Sig: Take 1 tablet by mouth every 8 (eight) hours as needed.    Dispense:  20 tablet    Refill:  0  . Zoster Vaccine Live, PF, (ZOSTAVAX) 57846 UNT/0.65ML injection    Sig: Inject 19,400 Units into the skin once.    Dispense:  1 vial    Refill:  0  . metoprolol tartrate (LOPRESSOR) 25 MG tablet    Sig: Take 1 tablet (25 mg total) by mouth 2 (two) times daily.    Dispense:  180 tablet    Refill:  3  . pantoprazole (PROTONIX) 40 MG tablet    Sig: Take 1 tablet (40 mg total) by mouth daily.    Dispense:  90 tablet    Refill:  3  . methocarbamol (ROBAXIN) 500 MG tablet    Sig: Take 1 tablet (500 mg total) by mouth at bedtime as needed and may repeat dose one time if needed for muscle spasms.    Dispense:  60 tablet    Refill:  0  . cephALEXin (KEFLEX) 500 MG capsule    Sig: Take 1 capsule (500 mg total) by mouth 2 (two) times daily.    Dispense:  14 capsule    Refill:  0   Language level caveat. Over 40 min spent in face-to-face evaluation of and consultation with patient and coordination of care.  Over 50% of this time was spent counseling this patient.  I personally performed the services described in this documentation, which was scribed  in my presence. The recorded information has been reviewed and considered, and addended by me as needed.   Delman Cheadle, M.D.  [facility] [address] [place],  [address] [phone] phone [phone] fax  11/08/16 1:24 AM

## 2016-11-01 NOTE — Patient Instructions (Addendum)
IF you received an x-ray today, you will receive an invoice from [facility]. Please contact [facility] at [phone] with questions or concerns regarding your invoice.   IF you received labwork today, you will receive an invoice from [employer]. Please contact [facility] at [phone] with questions or concerns regarding your invoice.   Our billing staff will not be able to assist you with questions regarding bills from these companies.  You will be contacted with the lab results as soon as they are available. The fastest way to get your results is to activate your My Chart account. Instructions are located on the last page of this paperwork. If you have not heard from [place] regarding the results in 2 weeks, please contact this office.     We recommend that you schedule a mammogram for breast cancer screening. Typically, you do not need a referral to do this. Please contact a local imaging center to schedule your mammogram.  [facility] - [phone]  *ask for the Radiology [place] ([place]) - [phone] or [phone]  [facility] - [phone] [phone] [facility] - [phone]  *ask for the [facility] - [phone]  *ask for the Radiology Department [facility] - [phone]  *ask for the [place] - [phone]    Urinary Frequency The number of times a normal person urinates depends upon how much liquid they take in and how much liquid they are losing. If the temperature is hot and there is high humidity, then the person will sweat more and usually breathe a little more frequently. These factors decrease the amount of frequency of urination that would be considered normal. The amount you drink is easily determined, but the amount of fluid lost  is sometimes more difficult to calculate.  Fluid is lost in two ways:  Sensible fluid loss is usually measured by the amount of urine that you get rid of. Losses of fluid can also occur with diarrhea.  Insensible fluid loss is more difficult to measure. It is caused by evaporation. Insensible loss of fluid occurs through breathing and sweating. It usually ranges from a little less than a quart to a little more than a quart of fluid a day. In normal temperatures and activity levels, the average person may urinate 4 to 7 times in a 24-hour period. Needing to urinate more often than that could indicate a problem. If one urinates 4 to 7 times in 24 hours and has large volumes each time, that could indicate a different problem from one who urinates 4 to 7 times a day and has small volumes. The time of urinating is also important. Most urinating should be done during the waking hours. Getting up at night to urinate frequently can indicate some problems. CAUSES  The bladder is the organ in your lower abdomen that holds urine. Like a balloon, it swells some as it fills up. Your nerves sense this and tell you it is time to head for the bathroom. There are a number of reasons that you might feel the need to urinate more often than usual. They include:  Urinary tract infection. This is usually associated with other signs such as burning when you urinate.  In men, problems with the prostate (a walnut-size gland that is located near the tube that carries urine out of your body). There  are two reasons why the prostate can cause an increased frequency of urination:  An enlarged prostate that does not let the bladder empty well. If the bladder only half empties when you urinate, then it only has half the capacity to fill before you have to urinate again.  The nerves in the bladder become more hypersensitive with an increased size of the prostate even if the bladder empties completely.  Pregnancy.  Obesity.  Excess weight is more likely to cause a problem for women than for men.  Bladder stones or other bladder problems.  Caffeine.  Alcohol.  Medications. For example, drugs that help the body get rid of extra fluid (diuretics) increase urine production. Some other medicines must be taken with lots of fluids.  Muscle or nerve weakness. This might be the result of a spinal cord injury, a stroke, multiple sclerosis, or Parkinson disease.  Long-standing diabetes can decrease the sensation of the bladder. This loss of sensation makes it harder to sense the bladder needs to be emptied. Over a period of years, the bladder is stretched out by constant overfilling. This weakens the bladder muscles so that the bladder does not empty well and has less capacity to fill with new urine.  Interstitial cystitis (also called painful bladder syndrome). This condition develops because the tissues that line the inside of the bladder are inflamed (inflammation is the body's way of reacting to injury or infection). It causes pain and frequent urination. It occurs in women more often than in men. DIAGNOSIS   To decide what might be causing your urinary frequency, your health care provider will probably:  Ask about symptoms you have noticed.  Ask about your overall health. This will include questions about any medications you are taking.  Do a physical examination.  Order some tests. These might include:  A blood test to check for diabetes or other health issues that could be contributing to the problem.  Urine testing. This could measure the flow of urine and the pressure on the bladder.  A test of your neurological system (the brain, spinal cord, and nerves). This is the system that senses the need to urinate.  A bladder test to check whether it is emptying completely when you urinate.  Cystoscopy. This test uses a thin tube with a tiny camera on it. It offers a look inside your urethra and bladder to see  if there are problems.  Imaging tests. You might be given a contrast dye and then asked to urinate. X-rays are taken to see how your bladder is working. TREATMENT  It is important for you to be evaluated to determine if the amount or frequency that you have is unusual or abnormal. If it is found to be abnormal, the cause should be determined and this can usually be found out easily. Depending upon the cause, treatment could include medication, stimulation of the nerves, or surgery. There are not too many things that you can do as an individual to change your urinary frequency. It is important that you balance the amount of fluid intake needed to compensate for your activity and the temperature. Medical problems will be diagnosed and taken care of by your physician. There is no particular bladder training such as Kegel exercises that you can do to help urinary frequency. This is an exercise that is usually recommended for people who have leaking of urine when they laugh, cough, or sneeze. HOME CARE INSTRUCTIONS   Take any medications your health care provider prescribed  or suggested. Follow the directions carefully.  Practice any lifestyle changes that are recommended. These might include:  Drinking less fluid or drinking at different times of the day. If you need to urinate often during the night, for example, you may need to stop drinking fluids early in the evening.  Cutting down on caffeine or alcohol. They both can make you need to urinate more often than normal. Caffeine is found in coffee, tea, and sodas.  Losing weight, if that is recommended.  Keep a journal or a log. You might be asked to record how much you drink and when and where you feel the need to urinate. This will also help evaluate how well the treatment provided by your physician is working. SEEK MEDICAL CARE IF:   Your need to urinate often gets worse.  You feel increased pain or irritation when you urinate.  You notice  blood in your urine.  You have questions about any medications that your health care provider recommended.  You notice blood, pus, or swelling at the site of any test or treatment procedure.  You develop a fever of more than 100.74F (38.1C). SEEK IMMEDIATE MEDICAL CARE IF:  You develop a fever of more than 102.52F (38.9C). This information is not intended to replace advice given to you by your health care provider. Make sure you discuss any questions you have with your health care provider. Document Released: 09/11/2009 Document Revised: 12/06/2014 Document Reviewed: 06/11/2015 Elsevier Interactive Patient Education  2017 [employer].

## 2016-11-02 LAB — PAP IG W/ RFLX HPV ASCU: PAP SMEAR COMMENT: 0

## 2016-11-03 LAB — URINE CULTURE

## 2016-11-03 LAB — COMPREHENSIVE METABOLIC PANEL
ALBUMIN: 4.7 g/dL (ref 3.6–4.8)
ALT: 23 IU/L (ref 0–32)
AST: 20 IU/L (ref 0–40)
Albumin/Globulin Ratio: 2 (ref 1.2–2.2)
Alkaline Phosphatase: 72 IU/L (ref 39–117)
BUN / CREAT RATIO: 20 (ref 12–28)
BUN: 14 mg/dL (ref 8–27)
Bilirubin Total: 0.4 mg/dL (ref 0.0–1.2)
CALCIUM: 9.5 mg/dL (ref 8.7–10.3)
CO2: 22 mmol/L (ref 18–29)
CREATININE: 0.69 mg/dL (ref 0.57–1.00)
Chloride: 100 mmol/L (ref 96–106)
GFR calc Af Amer: 105 mL/min/{1.73_m2} (ref >59)
GFR, EST NON AFRICAN AMERICAN: 91 mL/min/{1.73_m2} (ref >59)
GLOBULIN, TOTAL: 2.4 g/dL (ref 1.5–4.5)
Glucose: 76 mg/dL (ref 65–99)
Potassium: 4.3 mmol/L (ref 3.5–5.2)
SODIUM: 141 mmol/L (ref 134–144)
TOTAL PROTEIN: 7.1 g/dL (ref 6.0–8.5)

## 2016-11-03 LAB — LIPID PANEL
Chol/HDL Ratio: 4.6 ratio units — ABNORMAL HIGH (ref 0.0–4.4)
Cholesterol, Total: 251 mg/dL — ABNORMAL HIGH (ref 100–199)
HDL: 55 mg/dL (ref >39)
LDL Calculated: 170 mg/dL — ABNORMAL HIGH (ref 0–99)
Triglycerides: 130 mg/dL (ref 0–149)
VLDL Cholesterol Cal: 26 mg/dL (ref 5–40)

## 2016-11-03 LAB — HEPATITIS C ANTIBODY

## 2016-11-03 LAB — VITAMIN D 25 HYDROXY (VIT D DEFICIENCY, FRACTURES): VIT D 25 HYDROXY: 28.3 ng/mL — AB (ref 30.0–100.0)

## 2016-11-03 LAB — VITAMIN B12: Vitamin B-12: >2000 pg/mL — ABNORMAL HIGH (ref 232–1245)

## 2016-11-03 LAB — TSH: TSH: 1.82 u[IU]/mL (ref 0.450–4.500)

## 2016-11-08 MED ORDER — CEPHALEXIN 500 MG PO CAPS: 500.0000 mg | ORAL_CAPSULE | Freq: Two times a day (BID) | ORAL | 0 refills | Status: DC

## 2016-11-10 LAB — HM MAMMOGRAPHY

## 2016-11-16 ENCOUNTER — Telehealth: Payer: Self-pay

## 2016-11-16 NOTE — Telephone Encounter (Signed)
I changed the diagnosis code connected with the pap to encounter for gyn exam rather than screening which normally will work with the insurance so hopefully this covers it.  I'm not sure what those 5 # codes are - they look like CPT codes but that pulls up charges for a urine test, not for a pap or gyn exam.   Let me know if this doesn't work or what those codes are - def should be able to figure out a way to get this covered.    Thank you!

## 2016-11-16 NOTE — Telephone Encounter (Signed)
Pap is not covered by insurance with current diagnosis code.  code [phone] or [phone] will be covered. If one of these are appropriate , please change .

## 2017-01-10 ENCOUNTER — Encounter: Payer: Self-pay | Admitting: Internal Medicine

## 2017-04-05 ENCOUNTER — Encounter: Payer: Self-pay | Admitting: Physician Assistant

## 2017-04-05 ENCOUNTER — Ambulatory Visit (INDEPENDENT_AMBULATORY_CARE_PROVIDER_SITE_OTHER): Payer: BLUE CROSS/BLUE SHIELD | Admitting: Physician Assistant

## 2017-04-05 VITALS — BP 134/71 | HR 67 | Temp 97.8°F | Resp 18 | Ht 65.0 in | Wt 163.0 lb

## 2017-04-05 DIAGNOSIS — R300 Dysuria: Secondary | ICD-10-CM | POA: Diagnosis not present

## 2017-04-05 DIAGNOSIS — R350 Frequency of micturition: Secondary | ICD-10-CM | POA: Diagnosis not present

## 2017-04-05 LAB — POC MICROSCOPIC URINALYSIS (UMFC)

## 2017-04-05 LAB — POCT URINALYSIS DIP (MANUAL ENTRY)
Bilirubin, UA: NEGATIVE
Glucose, UA: NEGATIVE mg/dL
Ketones, POC UA: NEGATIVE mg/dL
Leukocytes, UA: NEGATIVE
Nitrite, UA: NEGATIVE
Protein Ur, POC: NEGATIVE mg/dL
Spec Grav, UA: >=1.03 — AB (ref 1.010–1.025)
Urobilinogen, UA: 0.2 U/dL
pH, UA: 5 (ref 5.0–8.0)

## 2017-04-05 MED ORDER — CIPROFLOXACIN HCL 500 MG PO TABS: 500.0000 mg | ORAL_TABLET | Freq: Two times a day (BID) | ORAL | 0 refills | Status: AC

## 2017-04-05 NOTE — Patient Instructions (Addendum)
[facility] [place], [place], [place] [address] has opthalmologist, to go to for eye care.    You can try the ranitidine 150mg  with meal twice per day.  Brand name is zantac.  If this does not help your symptoms, then I would like you to return in 2 weeks.   Make sure you increase your hydration to at least 64 oz per day.     Pyelonephritis, Adult Pyelonephritis is a kidney infection. The kidneys are organs that help clean your blood by moving waste out of your blood and into your pee (urine). This infection can happen quickly, or it can last for a long time. In most cases, it clears up with treatment and does not cause other problems. Follow these instructions at home: Medicines   Take over-the-counter and prescription medicines only as told by your doctor.  Take your antibiotic medicine as told by your doctor. Do not stop taking the medicine even if you start to feel better. General instructions   Drink enough fluid to keep your pee clear or pale yellow.  Avoid caffeine, tea, and carbonated drinks.  Pee (urinate) often. Avoid holding in pee for long periods of time.  Pee before and after sex.  After pooping (having a bowel movement), women should wipe from front to back. Use each tissue only once.  Keep all follow-up visits as told by your doctor. This is important. Contact a doctor if:  You do not feel better after 2 days.  Your symptoms get worse.  You have a fever. Get help right away if:  You cannot take your medicine or drink fluids as told.  You have chills and shaking.  You throw up (vomit).  You have very bad pain in your side (flank) or back.  You feel very weak or you pass out (faint). This information is not intended to replace advice given to you by your health care provider. Make sure you discuss any questions you have with your health care provider. Document Released: 12/23/2004 Document Revised: 04/22/2016 Document Reviewed: 03/10/2015 Elsevier  Interactive Patient Education  2017 [employer].     IF you received an x-ray today, you will receive an invoice from [facility]. Please contact [facility] at [phone] with questions or concerns regarding your invoice.   IF you received labwork today, you will receive an invoice from [facility]. Please contact [facility] at [phone] with questions or concerns regarding your invoice.   Our billing staff will not be able to assist you with questions regarding bills from these companies.  You will be contacted with the lab results as soon as they are available. The fastest way to get your results is to activate your My Chart account. Instructions are located on the last page of this paperwork. If you have not heard from [place] regarding the results in 2 weeks, please contact this office.

## 2017-04-05 NOTE — Progress Notes (Signed)
PRIMARY CARE AT [place], [place] [address] [phone]  Date:  04/05/2017   Name:  Lisa Costa   DOB:  [birth year]   MRN:  [id number]  PCP:  Shawnee Knapp, MD    History of Present Illness:  Lisa Costa is a 66 y.o. female patient who presents to PCP with  Chief Complaint  Patient presents with  . Dysuria    X 1 week  . Urinary Frequency    X 1 week- with burning     Dysuria for 1 week, and burning sensation There is some nausea, and frequency. No hematuria No vaginal discharge.  She has a hx of utis.   No fevers, or back pain.  Patient Active Problem List   Diagnosis Date Noted  . ALLERGIC RHINITIS 08/27/2010  . STRESS INCONTINENCE 12/22/2009  . HYPERCHOLESTEROLEMIA 03/24/2009  . LUNG NODULE 01/03/2009  . TB SKIN TEST, POSITIVE 01/02/2009  . MICROSCOPIC HEMATURIA 12/31/2008  . VAGINAL MASS 12/05/2008  . ANXIETY DEPRESSION 07/22/2008  . Disorder of bone and cartilage 05/07/2008  . DECREASED HEARING 05/06/2008  . DENTAL CARIES 10/03/2007  . GERD 10/03/2007    Past Medical History:  Diagnosis Date  . GERD (gastroesophageal reflux disease)   . Heart murmur   . Hypercholesteremia   . Left radial head fracture   . Osteoporosis   . Thyroid disease 2010   Positive skin test/negative chest xray    Past Surgical History:  Procedure Laterality Date  . CESAREAN SECTION     3 births    Social History  Substance Use Topics  . Smoking status: Former Research scientist (life sciences)  . Smokeless tobacco: Never Used  . Alcohol use 1.0 oz/week    2 drink(s) per week     Comment: occational    Family History  Problem Relation Age of Onset  . Heart disease Mother     Allergies  Allergen Reactions  . Tramadol Nausea And Vomiting    Medication list has been reviewed and updated.  Current Outpatient Prescriptions on File Prior to Visit  Medication Sig Dispense Refill  . aspirin-acetaminophen-caffeine (EXCEDRIN MIGRAINE) 250-250-65 MG per tablet  Take 2 tablets by mouth every 6 (six) hours as needed. Reported on 11/25/2015    . azelastine (OPTIVAR) 0.05 % ophthalmic solution Place 1 drop into both eyes 2 (two) times daily. 6 mL 12  . b complex vitamins tablet Take 1 tablet by mouth daily.      . calcium-vitamin D (OSCAL) 250-125 MG-UNIT per tablet Take 1 tablet by mouth daily. Reported on 11/25/2015    . cephALEXin (KEFLEX) 500 MG capsule Take 1 capsule (500 mg total) by mouth 2 (two) times daily. 14 capsule 0  . HYDROcodone-acetaminophen (NORCO/VICODIN) 5-325 MG tablet Take 1 tablet by mouth every 8 (eight) hours as needed. 20 tablet 0  . meclizine (ANTIVERT) 12.5 MG tablet Take 1 tablet (12.5 mg total) by mouth 3 (three) times daily as needed for dizziness. 30 tablet 0  . methocarbamol (ROBAXIN) 500 MG tablet Take 1 tablet (500 mg total) by mouth at bedtime as needed and may repeat dose one time if needed for muscle spasms. 60 tablet 0  . metoprolol tartrate (LOPRESSOR) 25 MG tablet Take 1 tablet (25 mg total) by mouth 2 (two) times daily. 180 tablet 3  . pantoprazole (PROTONIX) 40 MG tablet Take 1 tablet (40 mg total) by mouth daily. 90 tablet 3   No current facility-administered medications on file prior to  visit.     ROS ROS otherwise unremarkable unless listed above.  Physical Examination: BP 134/71 (BP Location: Right Arm, Patient Position: Sitting, Cuff Size: Small)   Pulse 67   Temp 97.8 F (36.6 C) (Oral)   Resp 18   Ht 5\' 5"  (1.651 m)   Wt 163 lb (73.9 kg)   SpO2 97%   BMI 27.12 kg/m  Ideal Body Weight: Weight in (lb) to have BMI = 25: 149.9  Physical Exam  Constitutional: She is oriented to person, place, and time. She appears well-developed and well-nourished. No distress.  HENT:  Head: Normocephalic and atraumatic.  Right Ear: External ear normal.  Left Ear: External ear normal.  Eyes: Conjunctivae and EOM are normal. Pupils are equal, round, and reactive to light.  Cardiovascular: Normal rate, regular  rhythm and normal heart sounds.   No murmur heard. Pulmonary/Chest: Effort normal. No respiratory distress. She has no wheezes.  Abdominal: Soft. Normal appearance and bowel sounds are normal. There is tenderness in the left upper quadrant. There is no rigidity, no guarding and no CVA tenderness.  Neurological: She is alert and oriented to person, place, and time.  Skin: She is not diaphoretic.  Psychiatric: She has a normal mood and affect. Her behavior is normal.     Results for orders placed or performed in visit on 04/05/17  POCT urinalysis dipstick  Result Value Ref Range   Color, UA yellow yellow   Clarity, UA clear clear   Glucose, UA negative negative mg/dL   Bilirubin, UA negative negative   Ketones, POC UA negative negative mg/dL   Spec Grav, UA >=1.030 (A) 1.010 - 1.025   Blood, UA trace-intact (A) negative   pH, UA 5.0 5.0 - 8.0   Protein Ur, POC negative negative mg/dL   Urobilinogen, UA 0.2 0.2 or 1.0 E.U./dL   Nitrite, UA Negative Negative   Leukocytes, UA Negative Negative  POCT Microscopic Urinalysis ([facility])  Result Value Ref Range   WBC,UR,HPF,POC Few (A) None WBC/hpf   RBC,UR,HPF,POC None None RBC/hpf   Bacteria Moderate (A) None, Too numerous to count   Mucus Present (A) Absent   Epithelial Cells, UR Per Microscopy Moderate (A) None, Too numerous to count cells/hpf     Assessment and Plan: Lisa Costa is a 66 y.o. female who is here today for urinary frequency. Urinary frequency - Plan: POCT urinalysis dipstick, POCT Microscopic Urinalysis ([facility]), Urine culture  Dysuria - Plan: POCT urinalysis dipstick, POCT Microscopic Urinalysis ([facility]), Urine culture, ciprofloxacin (CIPRO) 500 MG tablet  Ivar Drape, PA-C [facility] 5/9/20188:25 PM

## 2017-04-05 NOTE — Progress Notes (Signed)
hay 

## 2017-04-07 LAB — URINE CULTURE

## 2017-04-18 ENCOUNTER — Encounter: Payer: Self-pay | Admitting: Family Medicine

## 2017-04-18 ENCOUNTER — Ambulatory Visit (INDEPENDENT_AMBULATORY_CARE_PROVIDER_SITE_OTHER): Payer: BLUE CROSS/BLUE SHIELD | Admitting: Family Medicine

## 2017-04-18 VITALS — BP 113/67 | HR 77 | Temp 97.6°F | Resp 18 | Ht 64.25 in | Wt 166.0 lb

## 2017-04-18 DIAGNOSIS — N390 Urinary tract infection, site not specified: Secondary | ICD-10-CM

## 2017-04-18 DIAGNOSIS — N3941 Urge incontinence: Secondary | ICD-10-CM | POA: Diagnosis not present

## 2017-04-18 DIAGNOSIS — J069 Acute upper respiratory infection, unspecified: Secondary | ICD-10-CM | POA: Diagnosis not present

## 2017-04-18 LAB — POCT URINALYSIS DIP (MANUAL ENTRY)
Bilirubin, UA: NEGATIVE
Glucose, UA: NEGATIVE mg/dL
Ketones, POC UA: NEGATIVE mg/dL
LEUKOCYTES UA: NEGATIVE
NITRITE UA: NEGATIVE
PROTEIN UA: NEGATIVE mg/dL
Spec Grav, UA: 1.02 (ref 1.010–1.025)
UROBILINOGEN UA: 0.2 U/dL
pH, UA: 5.5 (ref 5.0–8.0)

## 2017-04-18 MED ORDER — IPRATROPIUM BROMIDE 0.03 % NA SOLN: 2.0000 | Freq: Four times a day (QID) | NASAL | 1 refills | Status: DC

## 2017-04-18 MED ORDER — BENZONATATE 200 MG PO CAPS: 200.0000 mg | ORAL_CAPSULE | Freq: Three times a day (TID) | ORAL | 0 refills | Status: DC | PRN

## 2017-04-18 MED ORDER — CHLORPHENIRAMINE MALEATE 4 MG PO TABS: 4.0000 mg | ORAL_TABLET | Freq: Four times a day (QID) | ORAL | 0 refills | Status: DC | PRN

## 2017-04-18 NOTE — Progress Notes (Signed)
Subjective:    [name], female    DOB: 01/07/1951, 66 y.o.   MRN: [id number] Chief Complaint  Patient presents with  . Cough    dry cough since Friday   . Sore Throat    since Friday   . Chest Pain    congestion and sore ribs from coughing. Heard noises in chest.    HPI  She become ill on Friday- 4 days prior - starting with sore throat and nasal congestion. She has a lot of upper chest pain with cough and wheezing last night.  She was taking an abx for a UTI (cipro) which she thinks might have complicated presentation. She has been taking mucinex liquid with minimal relief.  DDD lumbar x 3 yrs - took ibuproen and robaxin.  She has not had any problems with this in the past several years until 2 days ago. She was coughing so much she thinks she threw her back out with severe sudden onset of low back pain with radiation down both of her l legs. She spent the day in bed with a lot of ice to her low back and gentle stretching and is now feeling immensely better. However she is worried about the cough causing a recurrence.  Urge incontinence - failed kegel exercises. Has to wear a pad or depends everywhere she goes. Denies leaking urine without awareness. Denies leaking urine with coughing or sneezing. Is more that she has severe urinary urgency and frequency.  Past Medical History:  Diagnosis Date  . GERD (gastroesophageal reflux disease)   . Heart murmur   . Hypercholesteremia   . Left radial head fracture   . Osteoporosis   . Thyroid disease 2010   Positive skin test/negative chest xray   Past Surgical History:  Procedure Laterality Date  . CESAREAN SECTION     3 births   Current Outpatient Prescriptions on File Prior to Visit  Medication Sig Dispense Refill  . aspirin-acetaminophen-caffeine (EXCEDRIN MIGRAINE) 250-250-65 MG per tablet Take 2 tablets by mouth every 6 (six) hours as needed. Reported on 11/25/2015    . azelastine (OPTIVAR) 0.05 %  ophthalmic solution Place 1 drop into both eyes 2 (two) times daily. 6 mL 12  . calcium-vitamin D (OSCAL) 250-125 MG-UNIT per tablet Take 1 tablet by mouth daily. Reported on 11/25/2015    . HYDROcodone-acetaminophen (NORCO/VICODIN) 5-325 MG tablet Take 1 tablet by mouth every 8 (eight) hours as needed. 20 tablet 0  . meclizine (ANTIVERT) 12.5 MG tablet Take 1 tablet (12.5 mg total) by mouth 3 (three) times daily as needed for dizziness. 30 tablet 0  . methocarbamol (ROBAXIN) 500 MG tablet Take 1 tablet (500 mg total) by mouth at bedtime as needed and may repeat dose one time if needed for muscle spasms. 60 tablet 0  . metoprolol tartrate (LOPRESSOR) 25 MG tablet Take 1 tablet (25 mg total) by mouth 2 (two) times daily. 180 tablet 3  . pantoprazole (PROTONIX) 40 MG tablet Take 1 tablet (40 mg total) by mouth daily. 90 tablet 3   No current facility-administered medications on file prior to visit.    Allergies  Allergen Reactions  . Tramadol Nausea And Vomiting   Family History  Problem Relation Age of Onset  . Heart disease Mother    Social History   Social History  . Marital status: Single    Spouse name: N/A  . Number of children: N/A  . Years of education: N/A  Social History Main Topics  . Smoking status: Former Research scientist (life sciences)  . Smokeless tobacco: Never Used  . Alcohol use 1.0 oz/week    2 drink(s) per week     Comment: occational  . Drug use: No  . Sexual activity: No   Other Topics Concern  . None   Social History Narrative  . None   Depression screen [facility] 2/9 04/18/2017 04/05/2017 11/01/2016 04/06/2016 08/22/2015  Decreased Interest 0 0 0 0 0  Down, Depressed, Hopeless 0 0 0 0 0  PHQ - 2 Score 0 0 0 0 0    Review of Systems  Constitutional: Positive for activity change, appetite change and fatigue. Negative for chills, diaphoresis and fever.  HENT: Positive for congestion, postnasal drip, rhinorrhea, sore throat and voice change. Negative for ear discharge, ear pain,  nosebleeds, sinus pressure, sneezing and trouble swallowing.   Eyes: Negative for discharge and itching.  Respiratory: Positive for cough. Negative for shortness of breath.   Cardiovascular: Negative for chest pain.  Gastrointestinal: Negative for abdominal pain, nausea and vomiting.  Musculoskeletal: Positive for arthralgias, back pain and joint swelling. Negative for gait problem, neck pain and neck stiffness.  Skin: Negative for rash.  Neurological: Negative for dizziness, syncope and headaches.  Hematological: Negative for adenopathy.  Psychiatric/Behavioral: Positive for sleep disturbance.   Left lateral malleolus is swollen    Objective:   Physical Exam  Constitutional: She is oriented to person, place, and time. She appears well-developed and well-nourished. No distress.  HENT:  Head: Normocephalic and atraumatic.  Right Ear: Tympanic membrane, external ear and ear canal normal.  Left Ear: Tympanic membrane, external ear and ear canal normal.  Nose: Rhinorrhea present. No mucosal edema.  Mouth/Throat: Uvula is midline and mucous membranes are normal. Posterior oropharyngeal erythema present. No oropharyngeal exudate or posterior oropharyngeal edema.  Eyes: Conjunctivae are normal. Right eye exhibits no discharge. Left eye exhibits no discharge. No scleral icterus.  Neck: Normal range of motion. Neck supple.  Cardiovascular: Normal rate, regular rhythm, normal heart sounds and intact distal pulses.   Pulmonary/Chest: Effort normal and breath sounds normal.  Abdominal: Soft. Bowel sounds are normal. She exhibits no distension and no mass. There is no tenderness. There is no rebound and no guarding.  Lymphadenopathy:    She has no cervical adenopathy.  Neurological: She is alert and oriented to person, place, and time.  Skin: Skin is warm and dry. She is not diaphoretic. No erythema.  Psychiatric: She has a normal mood and affect. Her behavior is normal.         BP 113/67    Pulse 77   Temp 97.6 F (36.4 C) (Oral)   Resp 18   Ht 5' 4.25" (1.632 m)   Wt 166 lb (75.3 kg)   SpO2 97%   BMI 28.27 kg/m   Results for orders placed or performed in visit on 04/18/17  POCT urinalysis dipstick  Result Value Ref Range   Color, UA light yellow (A) yellow   Clarity, UA clear clear   Glucose, UA negative negative mg/dL   Bilirubin, UA negative negative   Ketones, POC UA negative negative mg/dL   Spec Grav, UA 1.020 1.010 - 1.025   Blood, UA trace-lysed (A) negative   pH, UA 5.5 5.0 - 8.0   Protein Ur, POC negative negative mg/dL   Urobilinogen, UA 0.2 0.2 or 1.0 E.U./dL   Nitrite, UA Negative Negative   Leukocytes, UA Negative Negative    Assessment & Plan:  1. Acute upper respiratory infection - suspect viral, treat symptomatically. I do expect that she will start to see some improvement within the next day or two so call if does not.   2. Urge incontinence - could do trial of bladder antispasmodic like ditropan or vesicare - pt will RTC if interested in discussing this or below.  3. Recurrent UTI - could do trial of urethral topical estrogen    Orders Placed This Encounter  Procedures  . POCT urinalysis dipstick    Meds ordered this encounter  Medications  . ipratropium (ATROVENT) 0.03 % nasal spray    Sig: Place 2 sprays into the nose 4 (four) times daily.    Dispense:  30 mL    Refill:  1  . benzonatate (TESSALON) 200 MG capsule    Sig: Take 1 capsule (200 mg total) by mouth 3 (three) times daily as needed for cough.    Dispense:  30 capsule    Refill:  0  . chlorpheniramine (CHLOR-TRIMETON) 4 MG tablet    Sig: Take 1 tablet (4 mg total) by mouth every 6 (six) hours as needed for rhinitis.    Dispense:  14 tablet    Refill:  0     Delman Cheadle, M.D.  Primary Care at [facility] [address] [place], [place] [address] [phone] phone [phone] fax  04/19/17 11:08 PM

## 2017-04-18 NOTE — Patient Instructions (Addendum)
   IF you received an x-ray today, you will receive an invoice from [facility]. Please contact [facility] at [phone] with questions or concerns regarding your invoice.   IF you received labwork today, you will receive an invoice from [facility]. Please contact [facility] at [phone] with questions or concerns regarding your invoice.   Our billing staff will not be able to assist you with questions regarding bills from these companies.  You will be contacted with the lab results as soon as they are available. The fastest way to get your results is to activate your My Chart account. Instructions are located on the last page of this paperwork. If you have not heard from us regarding the results in 2 weeks, please contact this office.      Upper Respiratory Infection, Adult Most upper respiratory infections (URIs) are a viral infection of the air passages leading to the lungs. A URI affects the nose, throat, and upper air passages. The most common type of URI is nasopharyngitis and is typically referred to as "the common cold." URIs run their course and usually go away on their own. Most of the time, a URI does not require medical attention, but sometimes a bacterial infection in the upper airways can follow a viral infection. This is called a secondary infection. Sinus and middle ear infections are common types of secondary upper respiratory infections. Bacterial pneumonia can also complicate a URI. A URI can worsen asthma and chronic obstructive pulmonary disease (COPD). Sometimes, these complications can require emergency medical care and may be life threatening. What are the causes? Almost all URIs are caused by viruses. A virus is a type of germ and can spread from one person to another. What increases the risk? You may be at risk for a URI if:  You smoke.  You have chronic heart or lung disease.  You have a weakened defense (immune) system.  You are very  young or very old.  You have nasal allergies or asthma.  You work in crowded or poorly ventilated areas.  You work in health care facilities or schools. What are the signs or symptoms? Symptoms typically develop 2-3 days after you come in contact with a cold virus. Most viral URIs last 7-10 days. However, viral URIs from the influenza virus (flu virus) can last 14-18 days and are typically more severe. Symptoms may include:  Runny or stuffy (congested) nose.  Sneezing.  Cough.  Sore throat.  Headache.  Fatigue.  Fever.  Loss of appetite.  Pain in your forehead, behind your eyes, and over your cheekbones (sinus pain).  Muscle aches. How is this diagnosed? Your health care provider may diagnose a URI by:  Physical exam.  Tests to check that your symptoms are not due to another condition such as:  Strep throat.  Sinusitis.  Pneumonia.  Asthma. How is this treated? A URI goes away on its own with time. It cannot be cured with medicines, but medicines may be prescribed or recommended to relieve symptoms. Medicines may help:  Reduce your fever.  Reduce your cough.  Relieve nasal congestion. Follow these instructions at home:  Take medicines only as directed by your health care provider.  Gargle warm saltwater or take cough drops to comfort your throat as directed by your health care provider.  Use a warm mist humidifier or inhale steam from a shower to increase air moisture. This may make it easier to breathe.  Drink enough fluid to keep your urine clear   or pale yellow.  Eat soups and other clear broths and maintain good nutrition.  Rest as needed.  Return to work when your temperature has returned to normal or as your health care provider advises. You may need to stay home longer to avoid infecting others. You can also use a face mask and careful hand washing to prevent spread of the virus.  Increase the usage of your inhaler if you have asthma.  Do not  use any tobacco products, including cigarettes, chewing tobacco, or electronic cigarettes. If you need help quitting, ask your health care provider. How is this prevented? The best way to protect yourself from getting a cold is to practice good hygiene.  Avoid oral or hand contact with people with cold symptoms.  Wash your hands often if contact occurs. There is no clear evidence that vitamin C, vitamin E, echinacea, or exercise reduces the chance of developing a cold. However, it is always recommended to get plenty of rest, exercise, and practice good nutrition. Contact a health care provider if:  You are getting worse rather than better.  Your symptoms are not controlled by medicine.  You have chills.  You have worsening shortness of breath.  You have brown or red mucus.  You have yellow or brown nasal discharge.  You have pain in your face, especially when you bend forward.  You have a fever.  You have swollen neck glands.  You have pain while swallowing.  You have white areas in the back of your throat. Get help right away if:  You have severe or persistent:  Headache.  Ear pain.  Sinus pain.  Chest pain.  You have chronic lung disease and any of the following:  Wheezing.  Prolonged cough.  Coughing up blood.  A change in your usual mucus.  You have a stiff neck.  You have changes in your:  Vision.  Hearing.  Thinking.  Mood. This information is not intended to replace advice given to you by your health care provider. Make sure you discuss any questions you have with your health care provider. Document Released: 05/11/2001 Document Revised: 07/18/2016 Document Reviewed: 02/20/2014 Elsevier Interactive Patient Education  2017 [employer]vier Inc.  

## 2017-04-20 ENCOUNTER — Telehealth: Payer: Self-pay | Admitting: Family Medicine

## 2017-04-20 NOTE — Telephone Encounter (Signed)
PATIENT SAW DR. SHAW ON Monday (04/18/17) FOR A SORE THROAT AND NASAL CONGESTION. DR. Brigitte Pulse PRESCRIBED HER TO HAVE BENZONATATE 200 MG, ATROVENT NASAL SPRAY AND CHLORPHENIRAMINE 4 MG. SHE WOULD LIKE DR. SHAW TO KNOW THAT SHE IS STILL COUGHING AND SHE HAS THE CONGESTION WITH A HEADACHE NOW. WHAT SHOULD SHE DO? BEST PHONE [phone] (CELL) PHARMACY CHOICE IS [facility] ON [address]. [place]

## 2017-04-21 MED ORDER — AZITHROMYCIN 250 MG PO TABS: ORAL_TABLET | ORAL | 0 refills | Status: DC

## 2017-04-21 NOTE — Telephone Encounter (Signed)
Excellent advice. I sent in a prescription for a Z-Pak for her as well.

## 2017-04-21 NOTE — Telephone Encounter (Signed)
Pt cant use the nasal spray because it hurts her sinuses. I advised nasal saline switch. Using mucinex in day and suppressant at night  No fever or SOB, same concerns. She was worried because of green mucous now. I advised this doesn't always indicate infection, but new fever or wheeze or SOB needs to come back and see [place]. Any concerns or advice at this point?

## 2017-04-26 ENCOUNTER — Encounter: Payer: Self-pay | Admitting: Family Medicine

## 2017-04-26 ENCOUNTER — Ambulatory Visit (INDEPENDENT_AMBULATORY_CARE_PROVIDER_SITE_OTHER): Payer: BLUE CROSS/BLUE SHIELD | Admitting: Family Medicine

## 2017-04-26 VITALS — BP 122/73 | HR 60 | Temp 98.8°F | Resp 16 | Ht 65.0 in | Wt 165.8 lb

## 2017-04-26 DIAGNOSIS — R21 Rash and other nonspecific skin eruption: Secondary | ICD-10-CM | POA: Diagnosis not present

## 2017-04-26 DIAGNOSIS — N390 Urinary tract infection, site not specified: Secondary | ICD-10-CM | POA: Diagnosis not present

## 2017-04-26 DIAGNOSIS — J069 Acute upper respiratory infection, unspecified: Secondary | ICD-10-CM | POA: Diagnosis not present

## 2017-04-26 DIAGNOSIS — R300 Dysuria: Secondary | ICD-10-CM | POA: Diagnosis not present

## 2017-04-26 LAB — POC MICROSCOPIC URINALYSIS (UMFC): Mucus: ABSENT

## 2017-04-26 LAB — POCT URINALYSIS DIP (MANUAL ENTRY)
BILIRUBIN UA: NEGATIVE
BILIRUBIN UA: NEGATIVE mg/dL
Blood, UA: NEGATIVE
Glucose, UA: NEGATIVE mg/dL
NITRITE UA: NEGATIVE
PH UA: 5 (ref 5.0–8.0)
PROTEIN UA: NEGATIVE mg/dL
Spec Grav, UA: <=1.005 — AB (ref 1.010–1.025)
Urobilinogen, UA: 0.2 E.U./dL

## 2017-04-26 MED ORDER — HYDROCORTISONE 2.5 % EX CREA: TOPICAL_CREAM | Freq: Two times a day (BID) | CUTANEOUS | 0 refills | Status: DC

## 2017-04-26 MED ORDER — SULFAMETHOXAZOLE-TRIMETHOPRIM 800-160 MG PO TABS: 1.0000 | ORAL_TABLET | Freq: Two times a day (BID) | ORAL | 0 refills | Status: AC

## 2017-04-26 NOTE — Telephone Encounter (Signed)
Has not started zpak, will come in and see provider for appt

## 2017-04-26 NOTE — Telephone Encounter (Signed)
PT FEEL LIKE SHE HAVE ANOTHER UTI PLEASE RESPOND

## 2017-04-26 NOTE — Progress Notes (Signed)
Chief Complaint  Patient presents with  . Dysuria    HPI   UTI and URI Pt was seen for a URI and was prescribed zithromax after supportive care did not resolve her symtoms She is here today because of a history of recurrent UTI and currently has symptoms.  She describes dysuria and urinary frequency She wanted to get her antibiotic changed to something that can treat a UTI and her URI.  No fevers or chills. Still has cough but improving. She does not have asthma   Review of labs Urine culture  E. Coli 04/05/17 E. Coli 11/01/16    Past Medical History:  Diagnosis Date  . GERD (gastroesophageal reflux disease)   . Heart murmur   . Hypercholesteremia   . Left radial head fracture   . Osteoporosis   . Thyroid disease 2010   Positive skin test/negative chest xray    Current Outpatient Prescriptions  Medication Sig Dispense Refill  . aspirin-acetaminophen-caffeine (EXCEDRIN MIGRAINE) 250-250-65 MG per tablet Take 2 tablets by mouth every 6 (six) hours as needed. Reported on 11/25/2015    . azelastine (OPTIVAR) 0.05 % ophthalmic solution Place 1 drop into both eyes 2 (two) times daily. 6 mL 12  . calcium-vitamin D (OSCAL) 250-125 MG-UNIT per tablet Take 1 tablet by mouth daily. Reported on 11/25/2015    . chlorpheniramine (CHLOR-TRIMETON) 4 MG tablet Take 1 tablet (4 mg total) by mouth every 6 (six) hours as needed for rhinitis. 14 tablet 0  . HYDROcodone-acetaminophen (NORCO/VICODIN) 5-325 MG tablet Take 1 tablet by mouth every 8 (eight) hours as needed. 20 tablet 0  . ipratropium (ATROVENT) 0.03 % nasal spray Place 2 sprays into the nose 4 (four) times daily. 30 mL 1  . meclizine (ANTIVERT) 12.5 MG tablet Take 1 tablet (12.5 mg total) by mouth 3 (three) times daily as needed for dizziness. 30 tablet 0  . methocarbamol (ROBAXIN) 500 MG tablet Take 1 tablet (500 mg total) by mouth at bedtime as needed and may repeat dose one time if needed for muscle spasms. 60 tablet 0  . metoprolol  tartrate (LOPRESSOR) 25 MG tablet Take 1 tablet (25 mg total) by mouth 2 (two) times daily. 180 tablet 3  . pantoprazole (PROTONIX) 40 MG tablet Take 1 tablet (40 mg total) by mouth daily. 90 tablet 3  . benzonatate (TESSALON) 200 MG capsule Take 1 capsule (200 mg total) by mouth 3 (three) times daily as needed for cough. (Patient not taking: Reported on 04/26/2017) 30 capsule 0  . hydrocortisone 2.5 % cream Apply topically 2 (two) times daily. 30 g 0  . sulfamethoxazole-trimethoprim (BACTRIM DS,SEPTRA DS) 800-160 MG tablet Take 1 tablet by mouth 2 (two) times daily. 14 tablet 0   No current facility-administered medications for this visit.     Allergies:  Allergies  Allergen Reactions  . Tramadol Nausea And Vomiting    Past Surgical History:  Procedure Laterality Date  . CESAREAN SECTION     3 births    Social History   Social History  . Marital status: Single    Spouse name: N/A  . Number of children: N/A  . Years of education: N/A   Social History Main Topics  . Smoking status: Former Research scientist (life sciences)  . Smokeless tobacco: Never Used  . Alcohol use 1.0 oz/week    2 drink(s) per week     Comment: occational  . Drug use: No  . Sexual activity: No   Other Topics Concern  . None  Social History Narrative  . None    ROS  Objective: Vitals:   04/26/17 1749  BP: 122/73  Pulse: 60  Resp: 16  Temp: 98.8 F (37.1 C)  TempSrc: Oral  SpO2: 98%  Weight: 165 lb 12.8 oz (75.2 kg)  Height: 5\' 5"  (1.651 m)    Physical Exam General: alert, oriented, in NAD Head: normocephalic, atraumatic, no sinus tenderness Eyes: EOM intact, no scleral icterus or conjunctival injection Ears: TM clear bilaterally Nose: mucosa nonerythematous, nonedematous Throat: no pharyngeal exudate or erythema Lymph: no posterior auricular, submental or cervical lymph adenopathy Heart: normal rate, normal sinus rhythm, no murmurs Lungs: clear to auscultation bilaterally, no wheezing Suprapubic mildly  tender to palpation, no flank pain Skin: fine bumps on chin    UA with moderate LE, neg nitrites  Assessment and Plan [name] was seen today for dysuria.  Diagnoses and all orders for this visit:  Dysuria -     POCT Microscopic Urinalysis ([facility]) -     POCT urinalysis dipstick -     Urine culture  Acute upper respiratory infection- changes abx to bactrim   Recurrent UTI- due to recurrent UTI will send urine for culture Will switch zithromax to bactrim  Facial rash- gave topical steroid for dermatitis -     hydrocortisone 2.5 % cream; Apply topically 2 (two) times daily.  Other orders -     sulfamethoxazole-trimethoprim (BACTRIM DS,SEPTRA DS) 800-160 MG tablet; Take 1 tablet by mouth 2 (two) times daily.  A total of 25 minutes were spent face-to-face with the patient during this encounter and over half of that time was spent on counseling and coordination of care.  Discussed antibiotics, discussed causes of recurrent UTI in postmenopausal females, discussed ways to prevent recurrence. Reviewed other urine cultures and recent office visit with patient.     Lisa Costa

## 2017-04-26 NOTE — Telephone Encounter (Signed)
lmtcb if any concerns

## 2017-04-26 NOTE — Patient Instructions (Signed)
     IF you received an x-ray today, you will receive an invoice from [facility]. Please contact [facility] at [phone] with questions or concerns regarding your invoice.   IF you received labwork today, you will receive an invoice from [facility]. Please contact [facility] at [phone] with questions or concerns regarding your invoice.   Our billing staff will not be able to assist you with questions regarding bills from these companies.  You will be contacted with the lab results as soon as they are available. The fastest way to get your results is to activate your My Chart account. Instructions are located on the last page of this paperwork. If you have not heard from us regarding the results in 2 weeks, please contact this office.     

## 2017-04-27 ENCOUNTER — Ambulatory Visit: Payer: BLUE CROSS/BLUE SHIELD | Admitting: Physician Assistant

## 2017-04-28 LAB — URINE CULTURE

## 2017-04-29 ENCOUNTER — Telehealth: Payer: Self-pay | Admitting: Family Medicine

## 2017-04-29 MED ORDER — NITROFURANTOIN MONOHYD MACRO 100 MG PO CAPS: 100.0000 mg | ORAL_CAPSULE | Freq: Two times a day (BID) | ORAL | 0 refills | Status: AC

## 2017-04-29 NOTE — Telephone Encounter (Signed)
Added macrobid based on urine culture Finish bactrim for bronchitis Pt notified. Med sent to [facility]

## 2017-05-03 DIAGNOSIS — N390 Urinary tract infection, site not specified: Secondary | ICD-10-CM | POA: Insufficient documentation

## 2017-05-10 ENCOUNTER — Telehealth: Payer: Self-pay | Admitting: Family Medicine

## 2017-05-10 NOTE — Telephone Encounter (Signed)
Pt is needing a refill on antivert 12.5 mg, lopressor 25 mg and Ibuprophen 800 mg   [facility] number [phone]

## 2017-05-14 NOTE — Telephone Encounter (Signed)
Called , spoke with patient , advised to make an appointment with Dr. Brigitte Pulse to refill her meds. She will come next week.

## 2017-05-18 ENCOUNTER — Ambulatory Visit: Payer: BLUE CROSS/BLUE SHIELD | Admitting: Family Medicine

## 2017-05-21 ENCOUNTER — Ambulatory Visit (INDEPENDENT_AMBULATORY_CARE_PROVIDER_SITE_OTHER): Payer: BLUE CROSS/BLUE SHIELD

## 2017-05-21 ENCOUNTER — Ambulatory Visit (HOSPITAL_COMMUNITY): Payer: Self-pay

## 2017-05-21 ENCOUNTER — Ambulatory Visit (INDEPENDENT_AMBULATORY_CARE_PROVIDER_SITE_OTHER): Payer: BLUE CROSS/BLUE SHIELD | Admitting: Family Medicine

## 2017-05-21 VITALS — BP 121/70 | HR 77 | Temp 98.6°F | Resp 16 | Ht 65.0 in | Wt 163.2 lb

## 2017-05-21 DIAGNOSIS — E782 Mixed hyperlipidemia: Secondary | ICD-10-CM | POA: Diagnosis not present

## 2017-05-21 DIAGNOSIS — R002 Palpitations: Secondary | ICD-10-CM

## 2017-05-21 DIAGNOSIS — M25522 Pain in left elbow: Secondary | ICD-10-CM

## 2017-05-21 DIAGNOSIS — H8112 Benign paroxysmal vertigo, left ear: Secondary | ICD-10-CM

## 2017-05-21 DIAGNOSIS — R5383 Other fatigue: Secondary | ICD-10-CM

## 2017-05-21 DIAGNOSIS — H8111 Benign paroxysmal vertigo, right ear: Secondary | ICD-10-CM

## 2017-05-21 DIAGNOSIS — K2100 Gastro-esophageal reflux disease with esophagitis, without bleeding: Secondary | ICD-10-CM

## 2017-05-21 DIAGNOSIS — J301 Allergic rhinitis due to pollen: Secondary | ICD-10-CM

## 2017-05-21 DIAGNOSIS — R531 Weakness: Secondary | ICD-10-CM | POA: Diagnosis not present

## 2017-05-21 DIAGNOSIS — R0602 Shortness of breath: Secondary | ICD-10-CM | POA: Diagnosis not present

## 2017-05-21 DIAGNOSIS — R0789 Other chest pain: Secondary | ICD-10-CM

## 2017-05-21 DIAGNOSIS — F411 Generalized anxiety disorder: Secondary | ICD-10-CM

## 2017-05-21 DIAGNOSIS — R7989 Other specified abnormal findings of blood chemistry: Secondary | ICD-10-CM | POA: Diagnosis not present

## 2017-05-21 DIAGNOSIS — N390 Urinary tract infection, site not specified: Secondary | ICD-10-CM

## 2017-05-21 LAB — POC MICROSCOPIC URINALYSIS (UMFC): Mucus: ABSENT

## 2017-05-21 LAB — POCT CBC
Granulocyte percent: 56.5 %G (ref 37–80)
HCT, POC: 41.9 % (ref 37.7–47.9)
Hemoglobin: 14.1 g/dL (ref 12.2–16.2)
Lymph, poc: 1.7 (ref 0.6–3.4)
MCH, POC: 31.3 pg — AB (ref 27–31.2)
MCHC: 33.5 g/dL (ref 31.8–35.4)
MCV: 93.2 fL (ref 80–97)
MID (cbc): 0.3 (ref 0–0.9)
MPV: 7.9 fL (ref 0–99.8)
POC Granulocyte: 2.5 (ref 2–6.9)
POC LYMPH PERCENT: 37.5 %L (ref 10–50)
POC MID %: 6 %M (ref 0–12)
Platelet Count, POC: 283 10*3/uL (ref 142–424)
RBC: 4.5 M/uL (ref 4.04–5.48)
RDW, POC: 13 %
WBC: 4.4 10*3/uL — AB (ref 4.6–10.2)

## 2017-05-21 LAB — POCT SEDIMENTATION RATE: POCT SED RATE: 20 mm/hr (ref 0–22)

## 2017-05-21 LAB — POCT URINALYSIS DIP (MANUAL ENTRY)
Bilirubin, UA: NEGATIVE
Glucose, UA: NEGATIVE mg/dL
Ketones, POC UA: NEGATIVE mg/dL
Leukocytes, UA: NEGATIVE
Nitrite, UA: NEGATIVE
Protein Ur, POC: NEGATIVE mg/dL
Spec Grav, UA: 1.025 (ref 1.010–1.025)
Urobilinogen, UA: 0.2 E.U./dL
pH, UA: 5.5 (ref 5.0–8.0)

## 2017-05-21 MED ORDER — ALBUTEROL SULFATE (2.5 MG/3ML) 0.083% IN NEBU
2.5000 mg | INHALATION_SOLUTION | Freq: Once | RESPIRATORY_TRACT | Status: AC
  Administered 2017-05-21: 2.5 mg via RESPIRATORY_TRACT

## 2017-05-21 MED ORDER — AZELASTINE HCL 0.05 % OP SOLN
1.0000 [drp] | Freq: Two times a day (BID) | OPHTHALMIC | 12 refills | Status: DC
Start: 2017-05-21 — End: 2018-02-06

## 2017-05-21 MED ORDER — SERTRALINE HCL 25 MG PO TABS: 25.0000 mg | ORAL_TABLET | Freq: Every day | ORAL | 1 refills | Status: DC

## 2017-05-21 MED ORDER — HYDROCODONE-ACETAMINOPHEN 5-325 MG PO TABS: 1.0000 | ORAL_TABLET | Freq: Three times a day (TID) | ORAL | 0 refills | Status: DC | PRN

## 2017-05-21 MED ORDER — IBUPROFEN 800 MG PO TABS: 800.0000 mg | ORAL_TABLET | Freq: Three times a day (TID) | ORAL | 6 refills | Status: DC | PRN

## 2017-05-21 MED ORDER — MECLIZINE HCL 12.5 MG PO TABS: 12.5000 mg | ORAL_TABLET | Freq: Three times a day (TID) | ORAL | 6 refills | Status: AC | PRN

## 2017-05-21 MED ORDER — CETIRIZINE HCL 10 MG PO TABS: 10.0000 mg | ORAL_TABLET | Freq: Every day | ORAL | 5 refills | Status: DC

## 2017-05-21 MED ORDER — METHOCARBAMOL 500 MG PO TABS: 500.0000 mg | ORAL_TABLET | Freq: Every evening | ORAL | 3 refills | Status: DC | PRN

## 2017-05-21 MED ORDER — PANTOPRAZOLE SODIUM 40 MG PO TBEC: 40.0000 mg | DELAYED_RELEASE_TABLET | Freq: Every day | ORAL | 3 refills | Status: DC

## 2017-05-21 NOTE — Patient Instructions (Signed)
     IF you received an x-ray today, you will receive an invoice from [facility]. Please contact [facility] at [phone] with questions or concerns regarding your invoice.   IF you received labwork today, you will receive an invoice from [facility]. Please contact [facility] at [phone] with questions or concerns regarding your invoice.   Our billing staff will not be able to assist you with questions regarding bills from these companies.  You will be contacted with the lab results as soon as they are available. The fastest way to get your results is to activate your My Chart account. Instructions are located on the last page of this paperwork. If you have not heard from us regarding the results in 2 weeks, please contact this office.     

## 2017-05-21 NOTE — Progress Notes (Addendum)
Chief Complaint  Patient presents with  . Medication Refill    Azelastine, Norco, antivert, robaxin, metoprolol, pantoprazole   HPI: She has mild Vitamin D deficiency, recommended to increase her supplement intake by 1000 units a day. She is taking vitamin D 3000u bid and red yeast rice but no other vitamins.   Going to the [place] at the [place], then [place], then [place] over the next 3 months.  She will feel normal, describes going down on the [place] wheel so just a few seconds of feeling dizzy and short of breath. She does describe an incident 3 yrs ago of palpitations for 10 min. Her PCP put her on metoprolol but she didn't really need it.  She has had a bad cold and recurrent UTI and now weakness. Having episodes of sudden few seconds of SHoB daily. She does not think it is panic, she thinks she is not breathing while sleeping. She snores a lot.  When she w[place] in the [place] she feels rested. She has fallen asleep while on computer. No falling asleep while driving. Occ has left chest chest pain radiating to the back that are not concurrent with her instantaneous weakness.  She sleeps well.  But ever since that URI the past month she has been feeling weak.  + seasonal allergies.  + tobacco use of 45 years at 1 ppd - quit 2009.  PMHx, PSHx, meds, allergies, sochx, famhx reviewed in detail.  ROS: as above  Obj: BP 121/70   Pulse 77   Temp 98.6 F (37 C) (Oral)   Resp 16   Ht 5\' 5"  (1.651 m)   Wt 163 lb 3.2 oz (74 kg)   SpO2 97%   PF 300 L/min   BMI 27.16 kg/m  Gen:  Alert, cooperative patient who appears stated age in no acute distress.  Vital signs reviewed. Pulm:  Clear to auscultation bilaterally with good air movement.  No wheezes or rales noted.   Cardiac:  Regular rate and rhythm.  Psych: Nml mood and affect. Appropriate behavior, speech, cognition, memory.  Vitamin B-12 was >2000 units, no prior B-12 or MMA. No prior inflammatory levels, and negative Hep C 11/17/16.    Peak flow: 300, predicted peak flow 424  Dg Chest 2 View  Result Date: 05/21/2017 CLINICAL DATA:  Shortness of breath and fatigue. EXAM: CHEST  2 VIEW COMPARISON:  None. FINDINGS: The heart size and mediastinal contours are within normal limits. Both lungs are clear. The visualized skeletal structures are unremarkable. IMPRESSION: No active cardiopulmonary disease. Electronically Signed   By: Dorise Bullion III M.D   On: 05/21/2017 11:04    [facility] reading (PRIMARY) by  Dr. Brigitte Pulse. EKG:  Normal sinus rhythm. No acute ischemic change. None prior for comparison.  Assessment and Plan: 1. Shortness of breath   2. Fatigue, unspecified type - could be secondary to worsening of life-long anxiety, start trial of sertraline 25. No prior trials of any mood meds.  3. Weakness - is having the instantaneous unprovoked episodes of few seconds of presyncope/weakness/shortness of breath daily. Pt also with very atypical episodes of chest pain but also h/o palpitations and recent onset of very brief episodes of presyncope (few seconds) she is having daily- would like to r/o arrhythmia. Cardiac risk factors of HLD, age, and h/o tob use. Pt is leaving the country soon on vacation so would appreciate eval asap.  4. Recurrent UTI - ua nml today. No hematuria on microscopy.  5. Mixed hyperlipidemia - lipids  sig improved with LDL dec from 170 to 140 today. Last yr ASCVD 9.2%, now down to 8.8% (optimal risk 4.9%) so still rec statin - start pravastatin 40. RTC 6-8 wks for OV and LFTs, RTC 4-6 mos for OV and fasting lipids.  Might have had the improvement over the past 6 mos from the red yeast rice which she can stop now that on statin. When adding in 6 mos prior data into this yr ASCVD risk calculator, data actually changes from initial risk of 7.6%, current risk of 7.5% and optimal risk of 2.6% so still rec starting mod to high intensity statin  6. High serum vitamin B12 - confirm that she is metabolizing correctly by  checking mma  7. Pain in left elbow   8. Benign paroxysmal positional vertigo, right   9. BPPV (benign paroxysmal positional vertigo), left   10. Gastroesophageal reflux disease with esophagitis - well controlled. Cont ppi.   11.    Seasonal allergic rhinitis due to pollen - start second gen antihistamine - try cetirizine 10 qhs. 12.    Generalized anxiety d/o - hopefully will be good for her to travel on these enjoyable vacations. Start trial of sertraline 25mg  qd.  13.    Palpitations - started on metoprolol many yrs prior for sxs but no prior cardiology eval 14.    Atypical CP - Pt with very atypical episodes of chest pain but also h/o palpitations and recent onset of very brief episodes of presyncope (few seconds) she is having daily- would like to r/o arrhythmia. Cardiac risk factors of HLD, age, and h/o tob use. Pt is leaving the country soon on vacation so would appreciate eval asap.   Orders Placed This Encounter  Procedures  . DG Chest 2 View    Standing Status:   Future    Number of Occurrences:   1    Standing Expiration Date:   05/21/2018    Order Specific Question:   Reason for Exam (SYMPTOM  OR DIAGNOSIS REQUIRED)    Answer:   shortness of breath and fatigue, + h/o tobacco use = quit 2009    Order Specific Question:   Preferred imaging location?    Answer:   External  . Comprehensive metabolic panel    Order Specific Question:   Has the patient fasted?    Answer:   Yes  . Lipid panel    Order Specific Question:   Has the patient fasted?    Answer:   Yes  . TSH  . Methylmalonic Acid, Serum  . Methylmalonic acid, serum  . Care order/instruction:    Scheduling Instructions:     Peak Flow (IF NEB IS ORDERED PLEASE DO BEFORE AND AFTER NEB)  . POCT CBC  . POCT urinalysis dipstick  . POCT Microscopic Urinalysis ([facility])  . POCT SEDIMENTATION RATE  . EKG 12-Lead    Meds ordered this encounter  Medications  . DISCONTD: ibuprofen (ADVIL,MOTRIN) 800 MG tablet    Sig: Take  800 mg by mouth every 8 (eight) hours as needed.  Marland Kitchen aspirin EC 81 MG tablet    Sig: Take 81 mg by mouth daily.  . Red Yeast Rice 600 MG CAPS    Sig: Take by mouth.  . Cholecalciferol (VITAMIN D3) 1000 units CAPS    Sig: Take by mouth.  Marland Kitchen ibuprofen (ADVIL,MOTRIN) 800 MG tablet    Sig: Take 1 tablet (800 mg total) by mouth every 8 (eight) hours as needed.    Dispense:  30 tablet    Refill:  6  . albuterol (PROVENTIL) (2.5 MG/3ML) 0.083% nebulizer solution 2.5 mg  . sertraline (ZOLOFT) 25 MG tablet    Sig: Take 1 tablet (25 mg total) by mouth at bedtime.    Dispense:  90 tablet    Refill:  1  . HYDROcodone-acetaminophen (NORCO/VICODIN) 5-325 MG tablet    Sig: Take 1 tablet by mouth every 8 (eight) hours as needed.    Dispense:  20 tablet    Refill:  0  . azelastine (OPTIVAR) 0.05 % ophthalmic solution    Sig: Place 1 drop into both eyes 2 (two) times daily.    Dispense:  6 mL    Refill:  12  . meclizine (ANTIVERT) 12.5 MG tablet    Sig: Take 1 tablet (12.5 mg total) by mouth 3 (three) times daily as needed for dizziness.    Dispense:  30 tablet    Refill:  6  . methocarbamol (ROBAXIN) 500 MG tablet    Sig: Take 1 tablet (500 mg total) by mouth at bedtime as needed and may repeat dose one time if needed for muscle spasms.    Dispense:  60 tablet    Refill:  3  . pantoprazole (PROTONIX) 40 MG tablet    Sig: Take 1 tablet (40 mg total) by mouth daily.    Dispense:  90 tablet    Refill:  3  . cetirizine (ZYRTEC) 10 MG tablet    Sig: Take 1 tablet (10 mg total) by mouth daily.    Dispense:  90 tablet    Refill:  5  . pravastatin (PRAVACHOL) 40 MG tablet    Sig: Take 1 tablet (40 mg total) by mouth daily.    Dispense:  90 tablet    Refill:  0      Delman Cheadle, M.D.  Primary Care at [facility] [address] [place], [place] [address] [phone] phone [phone] fax  05/27/17 5:31 PM

## 2017-05-21 NOTE — Progress Notes (Signed)
Lisa Costa Lisa Costa is a 66 y.o. female who presents to Primary Care at [facility] today for  Chief Complaint  Patient presents with  . Medication Refill    Azelastine, Norco, antivert, robaxin, metoprolol, pantoprazole    1. Seasonal allergies: Admits to watery eyes and runny nose x 2 months. No fever, chills. No sick contacts. She did have a URI last month however which she was given antibiotics for and it went away. Denies cough but admits to some intermittent SOB associated with anxiety.   2. Hyperlipidemia Symptoms Chest pain on exertion: Has chest pain without exertion Leg claudication: No  Medications (modifying factor): Has never been on any medications before    Component Value Date/Time   CHOL 251 (H) 11/01/2016 1626   TRIG 130 11/01/2016 1626   HDL 55 11/01/2016 1626   VLDL 23 04/06/2016 0939   CHOLHDL 4.6 (H) 11/01/2016 1626   CHOLHDL 4.3 04/06/2016 0939   3. Fatigue, shortness of breath, chest pain:  Patient notes that it started last month. Admits to chest pain occasionally in the left side that occurred at rest and lasts for about 1 minute. The shortness of breath occurs with and without exertion. Admits to intermittent edema when she is on her feet for a long time. Admits to dizziness sometimes. No falls.   4. Anxiety and Depression:  Both the chest pain and shortness of breath are associated with anxiety. Admits to anxiety and she is taking CBD oil for the last 3 days. Admits to feeling nervousness for her whole life. Has never seen psychiatry or taken medications for mood. Admits to worsening depression and anxiety x 1 year after losing her job. She notes she feels lonely. Does not have any friends in [place] because they are in [place]. Lives with her son only who is supportive. Denies any SI/HI. No passive death wish. Is traveling to [place] and [place] for 1 year and thinks she will be much happier then.   ROS as above. Pertinently, no Fever, Chills, Abd pain, N/V/D.    PMH reviewed. Patient is a nonsmoker.   Past Medical History:  Diagnosis Date  . GERD (gastroesophageal reflux disease)   . Heart murmur   . Hypercholesteremia   . Left radial head fracture   . Osteoporosis   . Thyroid disease 2010   Positive skin test/negative chest xray   Past Surgical History:  Procedure Laterality Date  . CESAREAN SECTION     3 births    Medications reviewed. Current Outpatient Prescriptions  Medication Sig Dispense Refill  . aspirin EC 81 MG tablet Take 81 mg by mouth daily.    Marland Kitchen azelastine (OPTIVAR) 0.05 % ophthalmic solution Place 1 drop into both eyes 2 (two) times daily. 6 mL 12  . calcium-vitamin D (OSCAL) 250-125 MG-UNIT per tablet Take 1 tablet by mouth daily. Reported on 11/25/2015    . chlorpheniramine (CHLOR-TRIMETON) 4 MG tablet Take 1 tablet (4 mg total) by mouth every 6 (six) hours as needed for rhinitis. 14 tablet 0  . Cholecalciferol (VITAMIN D3) 1000 units CAPS Take by mouth.    Marland Kitchen HYDROcodone-acetaminophen (NORCO/VICODIN) 5-325 MG tablet Take 1 tablet by mouth every 8 (eight) hours as needed. 20 tablet 0  . ibuprofen (ADVIL,MOTRIN) 800 MG tablet Take 800 mg by mouth every 8 (eight) hours as needed.    . meclizine (ANTIVERT) 12.5 MG tablet Take 1 tablet (12.5 mg total) by mouth 3 (three) times daily as needed for dizziness. 30 tablet  0  . methocarbamol (ROBAXIN) 500 MG tablet Take 1 tablet (500 mg total) by mouth at bedtime as needed and may repeat dose one time if needed for muscle spasms. 60 tablet 0  . metoprolol tartrate (LOPRESSOR) 25 MG tablet Take 1 tablet (25 mg total) by mouth 2 (two) times daily. 180 tablet 3  . pantoprazole (PROTONIX) 40 MG tablet Take 1 tablet (40 mg total) by mouth daily. 90 tablet 3  . Red Yeast Rice 600 MG CAPS Take by mouth.    Marland Kitchen aspirin-acetaminophen-caffeine (EXCEDRIN MIGRAINE) 250-250-65 MG per tablet Take 2 tablets by mouth every 6 (six) hours as needed. Reported on 11/25/2015    . benzonatate (TESSALON)  200 MG capsule Take 1 capsule (200 mg total) by mouth 3 (three) times daily as needed for cough. (Patient not taking: Reported on 04/26/2017) 30 capsule 0  . hydrocortisone 2.5 % cream Apply topically 2 (two) times daily. (Patient not taking: Reported on 05/21/2017) 30 g 0  . ipratropium (ATROVENT) 0.03 % nasal spray Place 2 sprays into the nose 4 (four) times daily. (Patient not taking: Reported on 05/21/2017) 30 mL 1   No current facility-administered medications for this visit.     Social History   Social History  . Marital status: Single    Spouse name: N/A  . Number of children: N/A  . Years of education: N/A   Occupational History  . Not on file.   Social History Main Topics  . Smoking status: Former Research scientist (life sciences)  . Smokeless tobacco: Never Used  . Alcohol use 1.0 oz/week    2 drink(s) per week     Comment: occational  . Drug use: No  . Sexual activity: No   Other Topics Concern  . Not on file   Social History Narrative  . No narrative on file    Physical Exam:  BP 121/70   Pulse 77   Temp 98.6 F (37 C) (Oral)   Resp 16   Ht 5\' 5"  (1.651 m)   Wt 163 lb 3.2 oz (74 kg)   SpO2 97%   BMI 27.16 kg/m  Gen:  Alert, cooperative patient who appears stated age in no acute distress.  Vital signs reviewed. HEENT: EOMI,  MMM Pulm:  Clear to auscultation bilaterally with good air movement.  No wheezes or rales noted.   Cardiac:  Regular rate and rhythm.  Good S1/S2. 2+ DP pulses bilaterally Abd:  Soft/nondistended/nontender.  Good bowel sounds throughout all four quadrants.  No masses noted.  Exts: Non edematous BL  LE, warm and well perfused.  Psych: tearful intermittently when discussing mood  Assessment and Plan:  1.  Hyperlipidemia: ASVCD score 9.2% based off of last lipid panel. Will recheck Lipid panel today. Will likely need high intensity statin.   2. Chest pain and shortness of breath: Likely from depression and anxiety however patient does have ASCVD risk of 9.2% as  above. Symptoms appear very atypical for cardiac etiology.  CXR ordered as well as CBC and CMP. May need referral to cardiology in the future.  3. Depression and Anxiety: Depression and anxiety after losing job 1 year ago. No SI/HI. Self medicating with CBD oil. Discussed stopping CBD oil ingestion. Additionally discussed referring to counselor/therapy but patient is leaving country in 1 month to travel. Thinks her mood will greatly improve once she is out of the house. Will check TSH today. Agreed to start Zoloft 25 mg daily. Follow up in 1 month.   4. Seasonal  Allergies: Will start Zyrtec  Smitty Cords, MD [place], PGY-2

## 2017-05-24 LAB — LIPID PANEL
Chol/HDL Ratio: 4.3 ratio (ref 0.0–4.4)
Cholesterol, Total: 221 mg/dL — ABNORMAL HIGH (ref 100–199)
HDL: 52 mg/dL (ref >39)
LDL Calculated: 141 mg/dL — ABNORMAL HIGH (ref 0–99)
TRIGLYCERIDES: 140 mg/dL (ref 0–149)
VLDL Cholesterol Cal: 28 mg/dL (ref 5–40)

## 2017-05-24 LAB — COMPREHENSIVE METABOLIC PANEL
A/G RATIO: 1.6 (ref 1.2–2.2)
ALK PHOS: 76 IU/L (ref 39–117)
ALT: 19 IU/L (ref 0–32)
AST: 22 IU/L (ref 0–40)
Albumin: 4.4 g/dL (ref 3.6–4.8)
BILIRUBIN TOTAL: 0.3 mg/dL (ref 0.0–1.2)
BUN/Creatinine Ratio: 19 (ref 12–28)
BUN: 13 mg/dL (ref 8–27)
CALCIUM: 9.3 mg/dL (ref 8.7–10.3)
CHLORIDE: 105 mmol/L (ref 96–106)
CO2: 17 mmol/L — ABNORMAL LOW (ref 20–29)
Creatinine, Ser: 0.7 mg/dL (ref 0.57–1.00)
GFR calc Af Amer: 104 mL/min/{1.73_m2} (ref >59)
GFR calc non Af Amer: 90 mL/min/{1.73_m2} (ref >59)
Globulin, Total: 2.8 g/dL (ref 1.5–4.5)
Glucose: 101 mg/dL — ABNORMAL HIGH (ref 65–99)
POTASSIUM: 4.7 mmol/L (ref 3.5–5.2)
SODIUM: 141 mmol/L (ref 134–144)
Total Protein: 7.2 g/dL (ref 6.0–8.5)

## 2017-05-24 LAB — METHYLMALONIC ACID, SERUM: METHYLMALONIC ACID: 97 nmol/L (ref 0–378)

## 2017-05-24 LAB — TSH: TSH: 2.04 u[IU]/mL (ref 0.450–4.500)

## 2017-05-27 ENCOUNTER — Telehealth: Payer: Self-pay | Admitting: Family Medicine

## 2017-05-27 MED ORDER — PRAVASTATIN SODIUM 40 MG PO TABS: 40.0000 mg | ORAL_TABLET | Freq: Every day | ORAL | 0 refills | Status: DC

## 2017-05-27 NOTE — Addendum Note (Signed)
Addended by: Shawnee Knapp on: 05/27/2017 05:55 PM   Modules accepted: Orders

## 2017-05-27 NOTE — Telephone Encounter (Signed)
Referrals entered. Labs sent to lab pool Cholesterol was better but not perfect so rec pt start on pravastatin.

## 2017-05-27 NOTE — Telephone Encounter (Signed)
Can you please release labs ? 

## 2017-05-27 NOTE — Telephone Encounter (Signed)
Pt is looking for her lab results and the information about her referral   [facility] number [phone]

## 2017-05-30 ENCOUNTER — Encounter: Payer: Self-pay | Admitting: *Deleted

## 2017-05-31 ENCOUNTER — Telehealth: Payer: Self-pay | Admitting: Family Medicine

## 2017-05-31 NOTE — Telephone Encounter (Signed)
Pt is looking for lab work results   [facility] [phone]

## 2017-05-31 NOTE — Telephone Encounter (Signed)
Patient was given lab results.  Advised to make an appointment. She stated she will call back and do so once she return from out of the country.

## 2017-06-30 NOTE — Telephone Encounter (Signed)
Patient was already contacted and will schedule a f/u visit when she comes back from out of the country.

## 2017-07-20 ENCOUNTER — Ambulatory Visit (INDEPENDENT_AMBULATORY_CARE_PROVIDER_SITE_OTHER): Payer: BLUE CROSS/BLUE SHIELD

## 2017-07-20 ENCOUNTER — Ambulatory Visit (INDEPENDENT_AMBULATORY_CARE_PROVIDER_SITE_OTHER): Payer: BLUE CROSS/BLUE SHIELD | Admitting: Family Medicine

## 2017-07-20 ENCOUNTER — Encounter: Payer: Self-pay | Admitting: Family Medicine

## 2017-07-20 VITALS — BP 106/66 | HR 75 | Temp 98.5°F | Ht 64.5 in | Wt 164.0 lb

## 2017-07-20 DIAGNOSIS — E7800 Pure hypercholesterolemia, unspecified: Secondary | ICD-10-CM

## 2017-07-20 DIAGNOSIS — M25572 Pain in left ankle and joints of left foot: Secondary | ICD-10-CM | POA: Diagnosis not present

## 2017-07-20 DIAGNOSIS — R21 Rash and other nonspecific skin eruption: Secondary | ICD-10-CM | POA: Diagnosis not present

## 2017-07-20 DIAGNOSIS — S93491A Sprain of other ligament of right ankle, initial encounter: Secondary | ICD-10-CM

## 2017-07-20 DIAGNOSIS — F341 Dysthymic disorder: Secondary | ICD-10-CM

## 2017-07-20 DIAGNOSIS — Z5181 Encounter for therapeutic drug level monitoring: Secondary | ICD-10-CM | POA: Diagnosis not present

## 2017-07-20 MED ORDER — IBUPROFEN 800 MG PO TABS: 800.0000 mg | ORAL_TABLET | Freq: Three times a day (TID) | ORAL | 0 refills | Status: DC | PRN

## 2017-07-20 NOTE — Patient Instructions (Addendum)
RICE:  R: Rest is achieved by limiting weightbearing until you are able to walk with a normal gait. I:   Ice or cold water immersion for 15 to 20 minutes every two to three hours for the first 48 hours or until swelling is improved, whichever comes first. C: Compression with an elastic bandage to minimize swelling should be applied early. E: Elevation - The injured ankle should be kept elevated above the level of the heart to further alleviate swelling.  Nonsteroidal antiinflammatory drugs (NSAIDs) can be used; no particular NSAID has been shown to be superior so you can using whatever you have at home - ibuprofen, aleve, motrin, advil, etc.    Exercises including pointing and flexing the foot and doing foot circles should be started early, once acute pain and swelling subside, to maintain range of motion. The intensity of rehabilitation is increased gradually.  Ankle splints or braces can limit extremes of joint motion and allow early weightbearing while protecting against reinjury.     IF you received an x-ray today, you will receive an invoice from [facility]. Please contact [facility] at [phone] with questions or concerns regarding your invoice.   IF you received labwork today, you will receive an invoice from [facility]. Please contact [facility] at [phone] with questions or concerns regarding your invoice.   Our billing staff will not be able to assist you with questions regarding bills from these companies.  You will be contacted with the lab results as soon as they are available. The fastest way to get your results is to activate your My Chart account. Instructions are located on the last page of this paperwork. If you have not heard from [place] regarding the results in 2 weeks, please contact this office.     Ankle Sprain, Phase II Rehab Ask your health care provider which exercises are safe for you. Do exercises exactly as told by your health care provider and  adjust them as directed. It is normal to feel mild stretching, pulling, tightness, or discomfort as you do these exercises, but you should stop right away if you feel sudden pain or your pain gets worse.Do not begin these exercises until told by your health care provider. Stretching and range of motion exercises These exercises warm up your muscles and joints and improve the movement and flexibility of your lower leg and ankle. These exercises also help to relieve pain and stiffness. Exercise A: Gastroc stretch, standing  1. Stand with your hands against a wall. 2. Extend your left / right leg behind you, and bend your front knee slightly. Your heels should be on the floor. 3. Keeping your heels on the floor and your back knee straight, shift your weight toward the wall. You should feel a gentle stretch in the back of your lower leg (calf). 4. Hold this position for __________ seconds. Repeat __________ times. Complete this exercise __________ times a day. Exercise B: Soleus stretch, standing 1. Stand with your hands against a wall. 2. Extend your left / right leg behind you, and bend your front knee slightly. Both of your heels should be on the floor. 3. Keeping your heels on the floor, bend your back knee and shift your weight slightly over your back leg. You should feel a gentle stretch deep in your calf. 4. Hold this position for __________ seconds. Repeat __________ times. Complete this exercise __________ times a day. Strengthening exercises These exercises build strength and endurance in your lower leg. Endurance is the  ability to use your muscles for a long time, even after they get tired. Exercise C: Heel walking ( dorsiflexion) Walk on your heels for __________ seconds or ___________ ft. Keep your toes as high as possible. Repeat __________ times. Complete this exercise __________ times a day. Balance exercises These exercises improve your balance and the reaction and control of  your ankle to help improve stability. Exercise D: Multi-angle lunge 1. Stand with your feet together. 2. Take a step forward with your left / right leg, and shift your weight onto that leg. Your back heel will come off the floor, and your back toes will stay in place. 3. Push off your front leg to return your front foot to the starting position next to your other foot. 4. Repeat to the side, to the back, and any other directions as told by your health care provider. Repeat in each direction __________ times. Complete this exercise __________ times a day. Exercise E: Single leg stand 1. Without shoes, stand near a railing or in a door frame. Hold onto the railing or door frame as needed. 2. Stand on your left / right foot. Keep your big toe down on the floor and try to keep your arch lifted. 3. Hold this position for __________ seconds. Repeat __________ times. Complete this exercise __________ times a day. If this exercise is too easy, you can try it with your eyes closed or while standing on a pillow. Exercise F: Inversion/eversion  You will need a balance board for this exercise. Ask your health care provider where you can get a balance board or how you can make one. 1. Stand on a non-carpeted surface near a countertop or wall. 2. Step onto the balance board so your feet are hip-width apart. 3. Keep your feet in place and keep your upper body and hips steady. Using only your feet and ankles to move the board, do one or both of the following exercises as told by your health care provider: ? Tip the board side to side as far as you can, alternating between tipping to the left and tipping to the right. If you can, tip the board so it silently taps the floor. Do not let the board forcefully hit the floor. From time to time, pause to hold a steady position. ? Tip the board side to side so the board does not hit the floor at all. From time to time, pause to hold a steady position. Repeat the  movement for each exercise __________ times. Complete each exercise __________ times a day. Exercise G: Plantar flexion/dorsiflexion  You will need a balance board for this exercise. Ask your health care provider where you can get a balance board or how you can make one. 1. Stand on a non-carpeted surface near a countertop or wall. 2. Step onto the balance board so your feet are hip-width apart. 3. Keep your feet in place and keep your upper body and hips steady. Using only your feet and ankles to move the board, do one or both of the following exercises as told by your health care provider: ? Tip the board forward and backward so the board silently taps the floor. Do not let the board forcefully hit the floor. From time to time, pause to hold a steady position. ? Tip the board forward and backward so the board does not hit the floor at all. From time to time, pause to hold a steady position. Repeat the movement for each exercise __________  times. Complete each exercise __________ times a day. This information is not intended to replace advice given to you by your health care provider. Make sure you discuss any questions you have with your health care provider. Document Released: 03/07/2006 Document Revised: 07/22/2016 Document Reviewed: 09/29/2015 Elsevier Interactive Patient Education  2018 [employer].

## 2017-07-20 NOTE — Progress Notes (Signed)
Subjective:  By signing my name below, I, Lisa Costa, attest that this documentation has been prepared under the direction and in the presence of Delman Cheadle, MD Electronically Signed: Ladene Artist, ED Scribe 07/20/2017 at 9:17 AM.   [name], female    DOB: July 14, 1951, 66 y.o.   MRN: [id number]  Chief Complaint  Patient presents with  . Follow-up    rash on upper lip and inner canthus'  . Rash  . Foot Pain    x 3 months off and on  . Follow-up    started on pravastatin - states she needs blood work  . Hyperlipidemia   HPI Lisa Costa [place] is a 66 y.o. female who presents to Primary Care at [facility] for a follow-up on her rash.   She was started on sertraline 25 mg due to worsening lifelong anxiety 2 months prior, however, she did not start the medication. States she started traveling to improve anxiety/depression instead. Pt just returned from vacation yesterday and leaves for another one next month.  Was having unprovoked episode a few seconds, presyncope weakness, sob, atypical cp. Wanted to rule out arrhythmia so referred to cardiology. H/o palpitations, started on propranolol but hadn't been seen by cardiology. On eval, she had sig reduced peak flow at 300 with goal of 424.   Pt was started on pravastatin  2 months prior as ASCVD risk had only decreased from 9.2-8.8 with tlc and RICE.   Only mention of a pruritic facial rash was seen by partner 3 months ago with fine bumps on chin and prescribed 2.5% hydrocortisone bid for dermatitis which she states has not provided any relief. States the rash started healing but now has started to spread to the other side. She has tried applying a topical cream to the area that she got while she was out of the country with some relief. Pt wears glasses but states that rash does not sit on the area where her rash is. Pt has never seen a dermatologist.   Pt reports intermittent left foot pain x 3 months. She  reports swelling to the left foot after bearing weight for longer than 30 minutes. She has tried applying ice to the left foot with temporary relief. Pt denies h/o previous injury or sprains.   Past Medical History:  Diagnosis Date  . GERD (gastroesophageal reflux disease)   . Heart murmur   . Hypercholesteremia   . Left radial head fracture   . Osteoporosis   . Thyroid disease 2010   Positive skin test/negative chest xray   Current Outpatient Prescriptions on File Prior to Visit  Medication Sig Dispense Refill  . aspirin EC 81 MG tablet Take 81 mg by mouth daily.    Marland Kitchen azelastine (OPTIVAR) 0.05 % ophthalmic solution Place 1 drop into both eyes 2 (two) times daily. 6 mL 12  . calcium-vitamin D (OSCAL) 250-125 MG-UNIT per tablet Take 1 tablet by mouth daily. Reported on 11/25/2015    . cetirizine (ZYRTEC) 10 MG tablet Take 1 tablet (10 mg total) by mouth daily. 90 tablet 5  . Cholecalciferol (VITAMIN D3) 1000 units CAPS Take by mouth.    Marland Kitchen HYDROcodone-acetaminophen (NORCO/VICODIN) 5-325 MG tablet Take 1 tablet by mouth every 8 (eight) hours as needed. 20 tablet 0  . ibuprofen (ADVIL,MOTRIN) 800 MG tablet Take 1 tablet (800 mg total) by mouth every 8 (eight) hours as needed. 30 tablet 6  . meclizine (ANTIVERT) 12.5 MG tablet Take 1  tablet (12.5 mg total) by mouth 3 (three) times daily as needed for dizziness. 30 tablet 6  . methocarbamol (ROBAXIN) 500 MG tablet Take 1 tablet (500 mg total) by mouth at bedtime as needed and may repeat dose one time if needed for muscle spasms. 60 tablet 3  . pantoprazole (PROTONIX) 40 MG tablet Take 1 tablet (40 mg total) by mouth daily. 90 tablet 3  . pravastatin (PRAVACHOL) 40 MG tablet Take 1 tablet (40 mg total) by mouth daily. 90 tablet 0  . sertraline (ZOLOFT) 25 MG tablet Take 1 tablet (25 mg total) by mouth at bedtime. 90 tablet 1  . chlorpheniramine (CHLOR-TRIMETON) 4 MG tablet Take 1 tablet (4 mg total) by mouth every 6 (six) hours as needed for  rhinitis. (Patient not taking: Reported on 07/20/2017) 14 tablet 0   No current facility-administered medications on file prior to visit.    Allergies  Allergen Reactions  . Tramadol Nausea And Vomiting   Review of Systems  Musculoskeletal: Positive for arthralgias and joint swelling.  Skin: Positive for rash.      Objective:   Physical Exam  Constitutional: She is oriented to person, place, and time. She appears well-developed and well-nourished. No distress.  HENT:  Head: Normocephalic and atraumatic.  Eyes: Conjunctivae and EOM are normal.  Neck: Neck supple. No tracheal deviation present.  Cardiovascular: Normal rate.   Pulses:      Dorsalis pedis pulses are 2+ on the right side, and 2+ on the left side.       Posterior tibial pulses are 2+ on the right side, and 2+ on the left side.  Pulmonary/Chest: Effort normal. No respiratory distress.  Musculoskeletal: Normal range of motion. She exhibits no edema.  No pitting edema. Tenderness to palpation over the medial malleolus. None over the 1st MTP. None over the calcaneous. No ankle instability. Positive pain with varus/valgus stress. Tenderness to palpation over posterior and inferior ligaments of the medial aspect.  Neurological: She is alert and oriented to person, place, and time.  Skin: Skin is warm and dry. Rash noted. Rash is macular and papular.  Dry, slightly erythematous macule rash with few pinpoint papules and scaling on the R lateral upper nasal bridge and central upper lip.  Psychiatric: She has a normal mood and affect. Her behavior is normal.  Nursing note and vitals reviewed.  Dg Ankle Complete Left  Result Date: 07/20/2017 CLINICAL DATA:  Pain left ankle and foot. EXAM: LEFT ANKLE COMPLETE - 3+ VIEW COMPARISON:  No prior. FINDINGS: All mild soft tissue swelling. No acute bony abnormality. No evidence of fracture or dislocation. IMPRESSION: Mild soft tissue swelling.  No acute abnormality . Electronically Signed    By: Marcello Moores  Register   On: 07/20/2017 09:38   BP 106/66 (BP Location: Right Arm, Patient Position: Sitting, Cuff Size: Large)   Pulse 75   Temp 98.5 F (36.9 C) (Oral)   Ht 5' 4.5" (1.638 m)   Wt 164 lb (74.4 kg)   SpO2 95%   BMI 27.72 kg/m     Assessment & Plan:   1. HYPERCHOLESTEROLEMIA   2. ANXIETY DEPRESSION - improved with traveling  3. Medication monitoring encounter   4. Facial rash - refer to derm, failed top hydrocortisone 2.5%  5. Pain of joint of left ankle and foot   6. Sprain of posterior talofibular ligament of right ankle, initial encounter - RICE, placed in sweed-o brace. Prn ibuprofen.    Orders Placed This Encounter  Procedures  .  DG Ankle Complete Left    Standing Status:   Future    Number of Occurrences:   1    Standing Expiration Date:   07/20/2018    Order Specific Question:   Reason for Exam (SYMPTOM  OR DIAGNOSIS REQUIRED)    Answer:   medial malleolar ttp over 1 mo, worsening    Order Specific Question:   Preferred imaging location?    Answer:   External  . Comprehensive metabolic panel  . Ambulatory referral to Dermatology    Referral Priority:   Routine    Referral Type:   Consultation    Referral Reason:   Specialty Services Required    Requested Specialty:   Dermatology    Number of Visits Requested:   1    Meds ordered this encounter  Medications  . ibuprofen (ADVIL,MOTRIN) 800 MG tablet    Sig: Take 1 tablet (800 mg total) by mouth every 8 (eight) hours as needed.    Dispense:  270 tablet    Refill:  0    I personally performed the services described in this documentation, which was scribed in my presence. The recorded information has been reviewed and considered, and addended by me as needed.   Delman Cheadle, M.D.  Primary Care at [facility] [address] [place], [place] [address] [phone] phone [phone] fax  07/22/17 9:52 PM

## 2017-07-21 LAB — COMPREHENSIVE METABOLIC PANEL
A/G RATIO: 2 (ref 1.2–2.2)
ALK PHOS: 75 IU/L (ref 39–117)
ALT: 30 IU/L (ref 0–32)
AST: 21 IU/L (ref 0–40)
Albumin: 4.5 g/dL (ref 3.6–4.8)
BILIRUBIN TOTAL: 0.4 mg/dL (ref 0.0–1.2)
BUN/Creatinine Ratio: 18 (ref 12–28)
BUN: 13 mg/dL (ref 8–27)
CHLORIDE: 108 mmol/L — AB (ref 96–106)
CO2: 21 mmol/L (ref 20–29)
Calcium: 9.2 mg/dL (ref 8.7–10.3)
Creatinine, Ser: 0.71 mg/dL (ref 0.57–1.00)
GFR calc Af Amer: 102 mL/min/{1.73_m2} (ref >59)
GFR calc non Af Amer: 88 mL/min/{1.73_m2} (ref >59)
GLUCOSE: 110 mg/dL — AB (ref 65–99)
Globulin, Total: 2.2 g/dL (ref 1.5–4.5)
POTASSIUM: 4.7 mmol/L (ref 3.5–5.2)
Sodium: 144 mmol/L (ref 134–144)
TOTAL PROTEIN: 6.7 g/dL (ref 6.0–8.5)

## 2017-11-07 IMAGING — DX DG CHEST 2V
2 series · 2 of 2 positions shown · non-contrast
Comparison: None.

CLINICAL DATA: Shortness of breath and fatigue.

EXAM:
CHEST  2 VIEW

[chest pa]
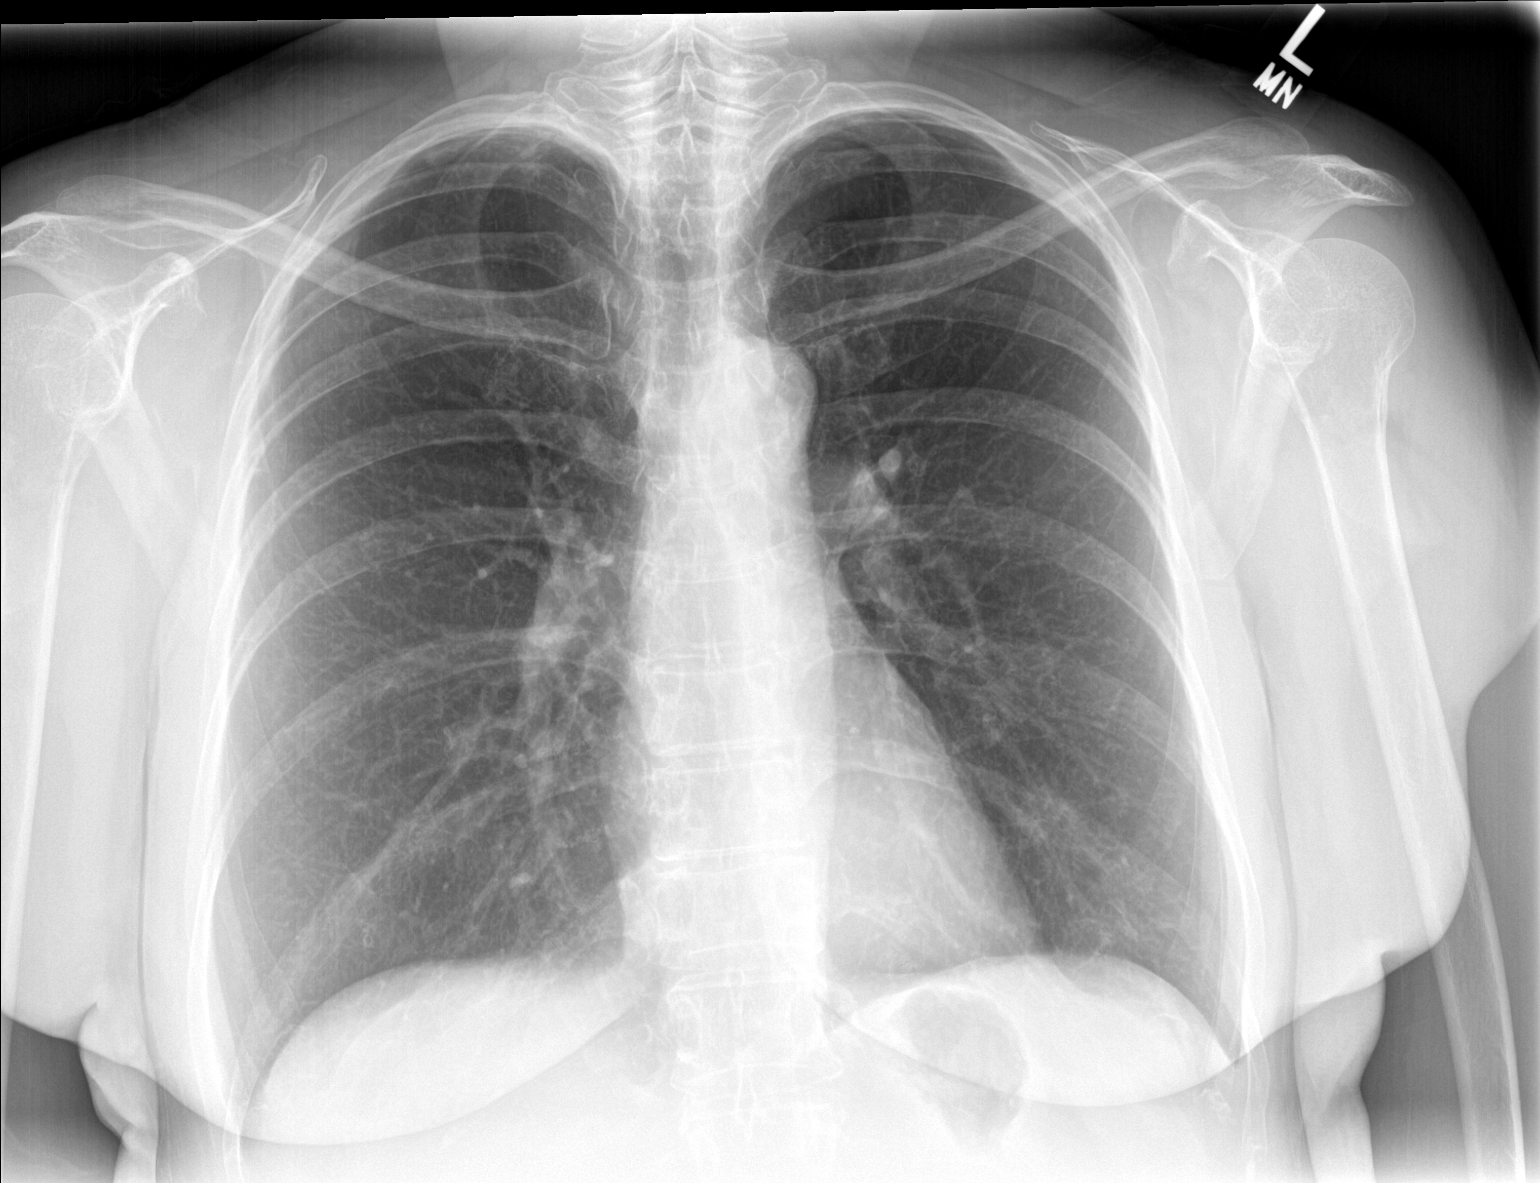

[chest lat]
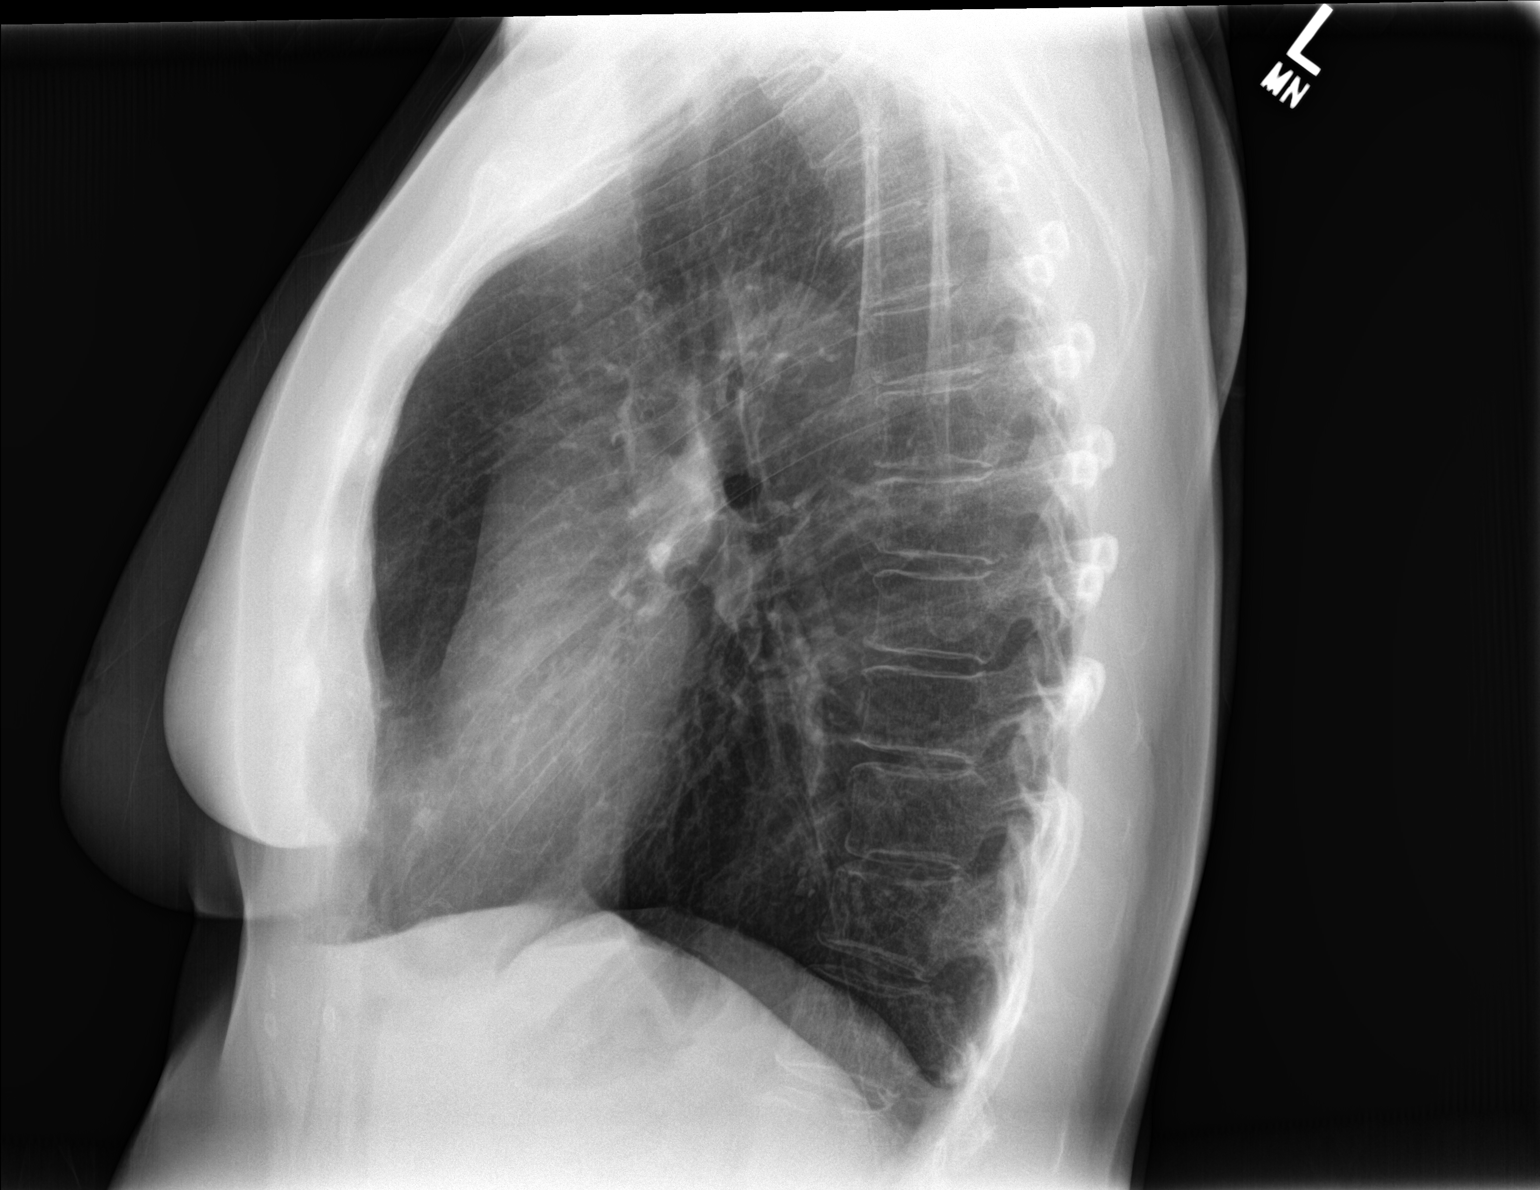

[2 of 2 positions shown; findings below may reference images not displayed]

FINDINGS: The heart size and mediastinal contours are within normal limits.
Both lungs are clear. The visualized skeletal structures are
unremarkable.
IMPRESSION: No active cardiopulmonary disease.

## 2017-12-08 ENCOUNTER — Other Ambulatory Visit: Payer: Self-pay

## 2017-12-08 ENCOUNTER — Ambulatory Visit: Payer: BLUE CROSS/BLUE SHIELD | Admitting: Family Medicine

## 2017-12-08 ENCOUNTER — Encounter: Payer: Self-pay | Admitting: Family Medicine

## 2017-12-08 VITALS — BP 100/60 | HR 86 | Temp 97.5°F | Resp 16 | Ht 64.5 in | Wt 154.4 lb

## 2017-12-08 DIAGNOSIS — R7303 Prediabetes: Secondary | ICD-10-CM

## 2017-12-08 DIAGNOSIS — R399 Unspecified symptoms and signs involving the genitourinary system: Secondary | ICD-10-CM | POA: Diagnosis not present

## 2017-12-08 DIAGNOSIS — R32 Unspecified urinary incontinence: Secondary | ICD-10-CM | POA: Diagnosis not present

## 2017-12-08 DIAGNOSIS — Z23 Encounter for immunization: Secondary | ICD-10-CM

## 2017-12-08 DIAGNOSIS — N390 Urinary tract infection, site not specified: Secondary | ICD-10-CM | POA: Diagnosis not present

## 2017-12-08 DIAGNOSIS — N3941 Urge incontinence: Secondary | ICD-10-CM

## 2017-12-08 LAB — POCT URINALYSIS DIP (MANUAL ENTRY)
BILIRUBIN UA: NEGATIVE mg/dL
Bilirubin, UA: NEGATIVE
Glucose, UA: NEGATIVE mg/dL
LEUKOCYTES UA: NEGATIVE
NITRITE UA: NEGATIVE
PH UA: 5.5 (ref 5.0–8.0)
PROTEIN UA: NEGATIVE mg/dL
Spec Grav, UA: 1.02 (ref 1.010–1.025)
UROBILINOGEN UA: 0.2 U/dL

## 2017-12-08 LAB — POC MICROSCOPIC URINALYSIS (UMFC): MUCUS RE: ABSENT

## 2017-12-08 MED ORDER — OXYBUTYNIN CHLORIDE ER 15 MG PO TB24: 15.0000 mg | ORAL_TABLET | Freq: Every day | ORAL | 2 refills | Status: DC

## 2017-12-08 MED ORDER — METFORMIN HCL 500 MG PO TABS: 500.0000 mg | ORAL_TABLET | Freq: Two times a day (BID) | ORAL | 1 refills | Status: DC

## 2017-12-08 MED ORDER — ESTROGENS, CONJUGATED 0.625 MG/GM VA CREA: TOPICAL_CREAM | VAGINAL | 12 refills | Status: DC

## 2017-12-08 MED ORDER — CEPHALEXIN 500 MG PO CAPS: 500.0000 mg | ORAL_CAPSULE | Freq: Two times a day (BID) | ORAL | 0 refills | Status: DC

## 2017-12-08 NOTE — Progress Notes (Signed)
Subjective:  By signing my name below, I, Lisa Costa, attest that this documentation has been prepared under the direction and in the presence of Delman Cheadle, MD Electronically Signed: Ladene Artist, ED Scribe 12/08/2017 at 5:38 PM.   [name], female    DOB: 08-08-1951, 67 y.o.   MRN: [id number]  Chief Complaint  Patient presents with  . Medication Problem    needs a different antibiotic , than what she was given for her UTI   HPI Lisa Costa is a 67 y.o. female who presents to Primary Care at [facility] complaining of recurrent UTI symptoms. Pt was diagnosed with another UTI in 11/7 while in [place]. She was treated with Levaquin 500 x 1 wk, Reglan 10 bid for nausea, ibuprofen 800 and Toradol 30. Culture on 1/2 showed E.coli resisted to bactrim and cipro. Pt was also prescribed premarin cream to use twice/week which she hasn't started yet and darifenacin, a bladder antispasmodic.  She has noticed some malodorous urine, urinary incontinence and mild nausea onset 11/29/17. Pt has been wearing pads for incontinence. Denies any pain.  Pre-Diabetes  Pt's A1C on 11/14 was 6.0, 5.6 on 12/21. She was given metformin 850 mg bid.  Past Medical History:  Diagnosis Date  . GERD (gastroesophageal reflux disease)   . Heart murmur   . Hypercholesteremia   . Left radial head fracture   . Osteoporosis   . Thyroid disease 2010   Positive skin test/negative chest xray   Current Outpatient Medications on File Prior to Visit  Medication Sig Dispense Refill  . conjugated estrogens (PREMARIN) vaginal cream Place 1 Applicatorful vaginally daily.    . metFORMIN (GLUCOPHAGE) 850 MG tablet Take 850 mg by mouth 2 (two) times daily with a meal.    . aspirin EC 81 MG tablet Take 81 mg by mouth daily.    Marland Kitchen azelastine (OPTIVAR) 0.05 % ophthalmic solution Place 1 drop into both eyes 2 (two) times daily. 6 mL 12  . calcium-vitamin D (OSCAL) 250-125 MG-UNIT per tablet Take 1  tablet by mouth daily. Reported on 11/25/2015    . cetirizine (ZYRTEC) 10 MG tablet Take 1 tablet (10 mg total) by mouth daily. 90 tablet 5  . chlorpheniramine (CHLOR-TRIMETON) 4 MG tablet Take 1 tablet (4 mg total) by mouth every 6 (six) hours as needed for rhinitis. (Patient not taking: Reported on 07/20/2017) 14 tablet 0  . Cholecalciferol (VITAMIN D3) 1000 units CAPS Take by mouth.    Marland Kitchen HYDROcodone-acetaminophen (NORCO/VICODIN) 5-325 MG tablet Take 1 tablet by mouth every 8 (eight) hours as needed. 20 tablet 0  . ibuprofen (ADVIL,MOTRIN) 800 MG tablet Take 1 tablet (800 mg total) by mouth every 8 (eight) hours as needed. 270 tablet 0  . meclizine (ANTIVERT) 12.5 MG tablet Take 1 tablet (12.5 mg total) by mouth 3 (three) times daily as needed for dizziness. 30 tablet 6  . methocarbamol (ROBAXIN) 500 MG tablet Take 1 tablet (500 mg total) by mouth at bedtime as needed and may repeat dose one time if needed for muscle spasms. 60 tablet 3  . pantoprazole (PROTONIX) 40 MG tablet Take 1 tablet (40 mg total) by mouth daily. 90 tablet 3  . pravastatin (PRAVACHOL) 40 MG tablet Take 1 tablet (40 mg total) by mouth daily. 90 tablet 0   No current facility-administered medications on file prior to visit.    Allergies  Allergen Reactions  . Tramadol Nausea And Vomiting   Past Surgical History:  Procedure Laterality Date  . CESAREAN SECTION     3 births   Family History  Problem Relation Age of Onset  . Heart disease Mother    Social History   Socioeconomic History  . Marital status: Single    Spouse name: None  . Number of children: None  . Years of education: None  . Highest education level: None  Social Needs  . Financial resource strain: None  . Food insecurity - worry: None  . Food insecurity - inability: None  . Transportation needs - medical: None  . Transportation needs - non-medical: None  Occupational History  . None  Tobacco Use  . Smoking status: Former Research scientist (life sciences)  .  Smokeless tobacco: Never Used  Substance and Sexual Activity  . Alcohol use: Yes    Alcohol/week: 1.0 oz    Types: 2 drink(s) per week    Comment: occational  . Drug use: No  . Sexual activity: No  Other Topics Concern  . None  Social History Narrative  . None   Depression screen [facility] 2/9 07/20/2017 05/21/2017 04/26/2017 04/18/2017 04/05/2017  Decreased Interest 0 0 0 0 0  Down, Depressed, Hopeless 0 0 0 0 0  PHQ - 2 Score 0 0 0 0 0    Review of Systems  Gastrointestinal: Positive for nausea (mild).  Genitourinary: Positive for enuresis. Negative for vaginal pain.      Objective:   Physical Exam  Constitutional: She is oriented to person, place, and time. She appears well-developed and well-nourished. No distress.  HENT:  Head: Normocephalic and atraumatic.  Eyes: Conjunctivae and EOM are normal.  Neck: Neck supple. No tracheal deviation present.  Cardiovascular: Normal rate.  Pulmonary/Chest: Effort normal. No respiratory distress.  Musculoskeletal: Normal range of motion.  Neurological: She is alert and oriented to person, place, and time.  Skin: Skin is warm and dry.  Psychiatric: She has a normal mood and affect. Her behavior is normal.  Nursing note and vitals reviewed.  BP 100/60   Pulse 86   Temp (!) 97.5 F (36.4 C)   Resp 16   Ht 5' 4.5" (1.638 m)   Wt 154 lb 6.4 oz (70 kg)   SpO2 97%   BMI 26.09 kg/m     Assessment & Plan:   1. UTI symptoms - pt had pyelo in Nov, recurrent UTI 6 wks later, now UTI again today 1 mo later. Hopefully will resolve after treating urge incontinence w/ bladder antispasmodic and topical estrogen - then if pt is leaking less and not wearing pads, may resolve. However, if continues, may need daily prophylactic antibiotic. Recheck in 1 mo.  2. Recurrent UTI - start periurethral premarin  3. Urinary incontinence, unspecified type   4. Urge incontinence - currently on enablex but not on insurance formulary and pt not sure it is effective  so will to trial of ditropan  5. Prediabetes - hgba1c was 6.0 but this was drawn while pt was very ill with pyelo- pt was started on metformin 850 bid and after 6 wks was down to 5.6 -> decrease metformin to 500 bid and recheck in 3-4 mos - hopefully will be able to wean her off as initial a1c may have been elev due to infection    Orders Placed This Encounter  Procedures  . Urine Culture  . Pneumococcal polysaccharide vaccine 23-valent greater than or equal to 2yo subcutaneous/IM  . POCT urinalysis dipstick  . POCT Microscopic Urinalysis ([facility])    Meds ordered  this encounter  Medications  . cephALEXin (KEFLEX) 500 MG capsule    Sig: Take 1 capsule (500 mg total) by mouth 2 (two) times daily.    Dispense:  20 capsule    Refill:  0  . oxybutynin (DITROPAN XL) 15 MG 24 hr tablet    Sig: Take 1 tablet (15 mg total) by mouth at bedtime.    Dispense:  30 tablet    Refill:  2  . conjugated estrogens (PREMARIN) vaginal cream    Sig: Place 1/2 applicatorful into vagina twice a week at night and apply a pea sized amount to the urethral meatus    Dispense:  42.5 g    Refill:  12  . metFORMIN (GLUCOPHAGE) 500 MG tablet    Sig: Take 1 tablet (500 mg total) by mouth 2 (two) times daily with a meal.    Dispense:  180 tablet    Refill:  1    I personally performed the services described in this documentation, which was scribed in my presence. The recorded information has been reviewed and considered, and addended by me as needed.   Delman Cheadle, M.D.  Primary Care at [facility] [place], [place] [address] [phone] phone [phone] fax  12/11/17 2:34 PM  Results for orders placed or performed in visit on 12/08/17  Urine Culture  Result Value Ref Range   Urine Culture, Routine Final report (A)    Organism ID, Bacteria Escherichia coli (A)    Antimicrobial Susceptibility Comment   POCT urinalysis dipstick  Result Value Ref Range   Color, UA yellow yellow    Clarity, UA clear clear   Glucose, UA negative negative mg/dL   Bilirubin, UA negative negative   Ketones, POC UA negative negative mg/dL   Spec Grav, UA 1.020 1.010 - 1.025   Blood, UA trace-lysed (A) negative   pH, UA 5.5 5.0 - 8.0   Protein Ur, POC negative negative mg/dL   Urobilinogen, UA 0.2 0.2 or 1.0 E.U./dL   Nitrite, UA Negative Negative   Leukocytes, UA Negative Negative  POCT Microscopic Urinalysis ([facility])  Result Value Ref Range   WBC,UR,HPF,POC None None WBC/hpf   RBC,UR,HPF,POC None None RBC/hpf   Bacteria Few (A) None, Too numerous to count   Mucus Absent Absent   Epithelial Cells, UR Per Microscopy None None, Too numerous to count cells/hpf

## 2017-12-08 NOTE — Patient Instructions (Addendum)
After you finish the Emselex (in the [place] this med is called Enablex - generic is darifenacin) you will change to oxybutynin instead - it is the same type of medicine - works the same way but should be cheaper price on your insurance.  Let me know if it does not work as well.  Use just 1/2 an applicator of the premarin cream into your vagina and then apply a pea-sized amount to your urethra (urethral meatus) - rub it into the top of the vagina - between the lips, below the clitoris.  Do this in the evening before bed twice a week.  We will decrease your metformin to 500mg  twice daily from the 850mg  twice daily after you finish your current bottle - as I suspect your initial blood sugar (hemoglobin a1c) was falsely elevated due to the severe illness.  Recheck in 3-4 months so we can see if we can wean you off further and help you get rid of the pre-diabetes.   IF you received an x-ray today, you will receive an invoice from [facility]. Please contact [facility] at [phone] with questions or concerns regarding your invoice.   IF you received labwork today, you will receive an invoice from [place]. Please contact [facility] at [phone] with questions or concerns regarding your invoice.   Our billing staff will not be able to assist you with questions regarding bills from these companies.  You will be contacted with the lab results as soon as they are available. The fastest way to get your results is to activate your My Chart account. Instructions are located on the last page of this paperwork. If you have not heard from [place] regarding the results in 2 weeks, please contact this office.    Vaginitis atrfica (Atrophic Vaginitis) La vaginitis atrfica es una afeccin en la que los tejidos que recubren la vagina se secan y [place]. Esta afeccin es ms frecuente en las mujeres que ya no tienen perodos menstruales regulares (menopausia). Esto suele comenzar cuando la mujer tiene  entre 49 y 35aos. El estrgeno ayuda a Theatre manager la humedad de la vagina, ya que la estimula para producir un lquido transparente que lubrica la vagina Gobles. Este lquido tambin protege a la vagina contra las infecciones. La falta de estrgeno puede hacer que el recubrimiento de la vagina se afine y se seque. El tamao de la vagina tambin puede disminuir y perder elasticidad. La vaginitis atrfica tiende a Engineering geologist a medida que el nivel de estrgeno desciende. CAUSAS La causa de esta afeccin es el descenso normal del nivel de estrgeno que se produce en torno a la menopausia. FACTORES DE RIESGO Determinadas afecciones o situaciones pueden reducir [employer] de estrgeno, lo que aumenta el riesgo de que una mujer presente vaginitis atrfica. Estos incluyen los siguientes:  Tomar un medicamento que detenga la produccin de [place].  Someterse a [place] de extraccin de ovarios.  Recibir tratamiento contra el cncer mediante rayos X (radiacin) o medicamentos (quimioterapia).  Hacer ejercicios muy intensos y con Building services engineer.  Tener un trastorno alimenticio (anorexia).  Dar a Customer service manager.  Tener ms de 50 aos.  Fumar. SNTOMAS Los sntomas de esta afeccin incluyen lo siguiente:  Social research officer, government, irritacin o sangrado durante las relaciones sexuales (dispareunia).  Ardor, irritacin o picazn vaginal.  Dolor o sangrado durante un examen vaginal con espculo (examen plvico).  Falta de inters en las [employer].  Ardor al [place].  Flujo vaginal marrn  o amarillo. En algunos casos no hay sntomas. DIAGNSTICO Esta afeccin se diagnostica mediante la historia clnica y un examen fsico. Este incluye un examen plvico que verifica si los tejidos de la parte interna de la vagina se ven plidos, delgados o secos. En contadas ocasiones, tambin [employer] Corporation, [place] los siguientes:  Anlisis de [place].  Un estudio  que verifica el equilibrio de cido en el lquido vaginal (estudio de equilibrio de cido). TRATAMIENTO El tratamiento de esta afeccin puede depender de la gravedad de los sntomas. El tratamiento puede incluir lo siguiente:  Uso de un lubricante vaginal de venta libre antes de las relaciones sexuales.  Uso de una crema humectante vaginal de accin prolongada.  Uso de estrgeno vaginal de dosis bajas para los sntomas de moderados a graves que no responden a otros tratamientos. Las opciones incluyen cremas, tabletas y [place]. Antes de usar estrgeno vaginal, dgale al mdico si tiene antecedentes de: ? Cncer de mama. ? Cncer de endometrio. ? Cogulos sanguneos.  Tomar medicamentos. Probablemente pueda tomar un comprimido diario para la dispareunia. Analice todos los riesgos de este medicamento con el mdico. Por lo general, no se recomienda en mujeres con antecedentes personales o familiares de cncer de mama. Si sus sntomas son muy leves y usted no [employer], es posible que no necesite tratamiento. [place] los medicamentos solamente como se lo haya indicado el mdico. No utilice medicamentos alternativos o a base de hierbas a menos que el mdico se lo autorice.  Utilice cremas, lubricantes o cremas humectantes de venta libre para la sequedad vaginal solamente como se lo haya indicado el mdico.  Si la causa de la vaginitis atrfica es la menopausia, analice todos sus sntomas y opciones de tratamiento con el mdico.  No se haga duchas vaginales.  No use productos que puedan causarle sequedad vaginal. Estos incluyen los siguientes: ? Aerosoles femeninos perfumados. ? Tampones perfumados. ? Jabones perfumados.  Hable con su pareja sexual si el coito le resulta doloroso. SOLICITE ATENCIN MDICA SI:  El flujo vaginal tiene un aspecto diferente del normal.  Detecta que tiene un olor inusual en la  vagina.  Aparecen nuevos sntomas.  Los sntomas no mejoran con Dispensing optician.  Los sntomas empeoran. Esta informacin no tiene Marine scientist el consejo del mdico. Asegrese de hacerle al mdico cualquier pregunta que tenga. Document Released: 04/01/2015 Document Revised: 04/01/2015 Document Reviewed: 11/06/2014 Elsevier Interactive Patient Education  2018 Elsevier Inc.   Prediabetes (Prediabetes) QU ES LA PREDIABETES? La prediabetes es la enfermedad que presenta un nivel de azcar en la sangre (glucemia) ms alto de lo normal, pero no lo suficientemente alto como para que le diagnostiquen diabetes tipo2. El hecho de ser prediabtico lo pone en riesgo de desarrollar diabetes tipo2 (diabetes mellitus tipo2). La prediabetes tambin se puede llamar intolerancia a la glucosa o glucosa alterada en ayunas. Generalmente, la prediabetes no causa sntomas. El mdico puede diagnosticar esta enfermedad por los anlisis de [place]. Los anlisis para Hydrographic surveyor la prediabetes se pueden realizar si usted tiene sobrepeso y si presenta al menos un factor de riesgo ms de prediabetes. Entre los factores de riesgo de prediabetes, se incluyen los siguientes:  Best boy un familiar con diabetes tipo2.  Sobrepeso u obesidad.  Tener ms de 20 aos.  Ser descendiente de indgenas norteamericanos, afroamericanos, hispanos o latinos, o asiticos o isleos del Pacfico.  Tener un estilo de vida inactivo (sedentario).  Tener antecedentes de diabetes  gestacional o sndrome de ovario poliqustico (SOP).  Tener niveles bajos del colesterol bueno (HDL-C) o niveles altos de grasas en la sangre (triglicridos).  Tener hipertensin arterial. QU ES LA GLUCEMIA Y CMO SE MIDE? La glucemia hace referencia a la cantidad de glucosa que tiene en el torrente sanguneo. La glucosa proviene de los alimentos que contienen azcar y almidn (carbohidratos) que el organismo descompone para formar glucosa. El nivel de  glucemia se puede medir en mg/dl (miligramos por decilitro) o mmol/l (milimoles por litro).La glucemia puede controlarse con uno o ms de los siguientes anlisis de sangre:  Medicin de la glucemia en [place]. No se le permitir comer (tendr que [employer]) durante al menos 8horas antes de que se tome una [place] de [place]. ? Un rango normal de glucemia en ayunas es de 70 a 100mg /dl (de 3,9 a 5,65mmol/l).  Un anlisis de sangre de A1c (hemoglobina A1c). Este anlisis proporciona informacin sobre el control de la glucemia durante los ltimos 2 o 66meses.  Prueba de tolerancia a la glucosa oral (PTGO). Esta prueba mide la glucemia dos veces: ? Despus del ayuno. Este es el valor inicial. ? Dos horas despus de ingerir una bebida que contiene glucosa. Pueden diagnosticarle prediabetes en los siguientes casos:  Si la glucemia en ayunas es de 100 a 125mg /dl (de 5,6 a 6,64mmol/l).  Si el nivel de A1c es del 5,7% al 6,4%.  Si el resultado de la PTGO es de 140 a 199mg /dl (de 7,8 a 2mmol/l). Estos anlisis de sangre se pueden repetir para Physicist, medical diagnstico. QU SUCEDE SI LA GLUCEMIA ES DEMASIADO ALTA? El pncreas produce una hormona (insulina) que ayuda a Civil Service fast streamer glucosa desde el torrente sanguneo hacia las clulas. Cuando las clulas no responden de forma [place] a la insulina que el organismo produce (resistencia a la insulina), el exceso de glucosa se acumula en la sangre en vez de dirigirse hacia las clulas. Como consecuencia, se puede desarrollar glucemia alta (hiperglucemia), que puede causar muchas complicaciones. Este es uno de los sntomas de la prediabetes. QU PUEDE SUCEDER SI LA GLUCEMIA PERMANECE MS ALTA DE LO NORMAL DURANTE MUCHO TIEMPO? Es peligroso Systems analyst glucemia alta durante mucho tiempo. Demasiada glucosa en la sangre puede daar los nervios y los vasos sanguneos. El dao a largo plazo puede provocar complicaciones de la diabetes, por  ejemplo:  Cardiopata.  Ictus.  Ceguera.  Enfermedad renal.  Depresin.  Mala circulacin en los pies y en las piernas, que podra llevar a la extraccin quirrgica (amputacin) en casos graves. CMO SE PUEDE EVITAR QUE LA PREDIABETES SE CONVIERTA EN DIABETES TIPO2? Para prevenir la diabetes tipo2, tome las siguientes medidas:  Haga actividad fsica. ? Haga actividad fsica de intensidad moderada durante al menos 2minutos como mnimo 5das por [employer]gy Corporation, o tanto como le haya indicado el mdico. Podra hacer caminatas dinmicas, ciclismo o [place]. ? Pregntele al mdico qu actividades son seguras para usted. Una combinacin de actividades puede ser la mejor opcin, por ejemplo, caminar, practicar natacin, andar en bicicleta y hacer entrenamiento de fuerza.  Baje de [employer]ab se lo haya indicado el mdico. ? Bajar entre el 5% y el 7% del peso corporal puede revertir la resistencia a la insulina. ? El mdico puede determinar cuntos kilos tiene que bajar y [place] a que adelgace de Geographical information systems officer segura.  Siga un plan de alimentacin saludable. Este incluye consumir protenas magras, hidratos de carbono complejos, frutas y verduras frescas, productos lcteos con bajo contenido de [place] y Physicist, medical  saludables. ? Siga las indicaciones del mdico respecto de las restricciones para las comidas o las bebidas. ? Programe una cita con un especialista en alimentacin y nutricin (nutricionista certificado) para que lo ayude a Manufacturing engineer plan de alimentacin saludable adecuado para usted.  No fume ni consuma ningn producto que contenga tabaco, lo que incluye cigarrillos, tabaco de Higher education careers adviser y Psychologist, sport and exercise. Si necesita ayuda para dejar de fumar, consulte al [employer].  [employer]phi de venta libre y los recetados como se lo haya indicado el mdico. Es posible que le receten medicamentos que ayuden a disminuir el riesgo de tener diabetes tipo2. Esta informacin no tiene Buyer, retail el consejo del mdico. Asegrese de hacerle al mdico cualquier pregunta que tenga. Document Released: 01/06/2016 Document Revised: 01/06/2016 Document Reviewed: 01/06/2016 Elsevier Interactive Patient Education  2018 [place] de alimentacin para la prediabetes (Prediabetes Eating Plan) La prediabetes, tambin llamada intolerancia a la glucosa o alteracin de la glucosa en ayunas, es una afeccin que eleva los niveles de azcar en la sangre (glucemia) por encima de lo normal. Seguir una dieta saludable puede ayudar a mantener la prediabetes bajo control, y tambin reduce el riesgo de tener diabetes tipo2 y cardiopata, que es ms alto en las personas que tienen esta afeccin. Junto con la actividad fsica habitual, una dieta saludable:  Promueve la prdida de [place].  Ayuda a Environmental consultant de Dispensing optician.  Ayuda a mejorar la forma en que el organismo [place] la insulina. QU DEBO SABER ACERCA DE ESTE PLAN DE [place]?  Use el ndice glucmico (IG) para planificar las comidas. El ndice le informa con qu rapidez un alimento elevar su nivel de azcar en la sangre. Elija los alimentos con bajo IG. Estos tardan ms tiempo en subir el nivel de azcar en la sangre.  Preste mucha atencin a la cantidad de hidratos de carbono que hay en los alimentos que consume. Los hidratos de carbono [employer] niveles de Dispensing optician.  Lleve un registro de la cantidad de caloras que ingiere. Ingerir la cantidad correcta de caloras lo ayudar a Development worker, international aid peso saludable. Bajar alrededor del 7por ciento del peso inicial puede ayudar a Product/process development scientist la diabetes tipo2.  Tal vez deba seguir Web designer. Esta incluye una gran cantidad de verduras, carnes magras o pescado, cereales integrales, frutas, as como aceites y grasas saludables.  QU ALIMENTOS PUEDO COMER? Cereales Cereales integrales, como panes, galletas, cereales y pastas de salvado o  integrales. Avena sin azcar. Trigo burgol. Cebada. Quinua. Arroz integral. Tortillas o tacos de harina de maz o de salvado. [place] Valeda Malm. Espinaca. Guisantes. Remolachas. Coliflor. Repollo. Brcoli. Zanahorias. Tomates. [place]. [place]pe. Hierbas. Pimientos. Cebollas. Pepinos. Repollitos de Bruselas. Frutas Frutos rojos. Bananas. Manzanas. Naranjas. Uvas. Papaya. Mango. [place]. Kiwi. Pomelo. Cerezas. Carnes y otras fuentes de protenas Mariscos. Carnes Stem, entre ellas, pollo y [place] o cortes magros de carne de cerdo y de [place]. Tofu. Huevos. Los frutos secos. Frijoles. Lcteos Productos lcteos descremados o semidescremados, como yogur, queso cottage y [place]. [employer]. T. Caf. Gaseosas sin azcar o dietticas. Agua de [place]. Leche. Productos alternativos [place], como leche de soja o de [place]. Condimentos [place]. Salsa de pepinillos. [place] con bajo contenido de [place] y de Location manager. Salsa barbacoa con bajo contenido de grasa y de azcar. [place]ones[place] sin grasa o con bajo contenido de [place]. Dulces y postres [place] sin azcar o con bajo contenido de [place]. Helados y otros dulces congelados sin  azcar o con bajo contenido de [place]. Grasas y Medical laboratory scientific officer. Nueces. Aceite de oliva. Los artculos mencionados arriba pueden no ser [employer] Foods Company de las bebidas o los alimentos recomendados. Comunquese con el nutricionista para conocer ms opciones. QU ALIMENTOS NO SE RECOMIENDAN? Cereales Productos a base de [place] y de [place], como panes, pastas, bocadillos y cereales. Bebidas Bebidas azucaradas, como t helado y gaseosas con Location manager. Dulces y postres Productos de [place], [place] tortas, [place], [place], Museum/gallery exhibitions officer y tarta de [place]. Los artculos mencionados arriba pueden no ser [employer] Foods Company de las bebidas y los alimentos que se Higher education careers adviser. Comunquese con el nutricionista para obtener ms informacin. Esta informacin no tiene Hydrologist el consejo del mdico. Asegrese de hacerle al mdico cualquier pregunta que tenga. Document Released: 08/06/2015 Document Revised: 08/06/2015 Document Reviewed: 12/11/2014 Elsevier Interactive Patient Education  2017 [employer].

## 2017-12-10 LAB — URINE CULTURE

## 2017-12-11 MED ORDER — NITROFURANTOIN MONOHYD MACRO 100 MG PO CAPS: 100.0000 mg | ORAL_CAPSULE | Freq: Two times a day (BID) | ORAL | 0 refills | Status: DC

## 2017-12-13 LAB — HM MAMMOGRAPHY

## 2018-01-07 ENCOUNTER — Ambulatory Visit: Payer: BLUE CROSS/BLUE SHIELD | Admitting: Family Medicine

## 2018-01-07 ENCOUNTER — Other Ambulatory Visit: Payer: Self-pay

## 2018-01-07 ENCOUNTER — Encounter: Payer: Self-pay | Admitting: Family Medicine

## 2018-01-07 VITALS — BP 126/72 | HR 80 | Temp 97.7°F | Resp 16 | Ht 64.5 in | Wt 152.2 lb

## 2018-01-07 DIAGNOSIS — R7303 Prediabetes: Secondary | ICD-10-CM | POA: Diagnosis not present

## 2018-01-07 DIAGNOSIS — N390 Urinary tract infection, site not specified: Secondary | ICD-10-CM

## 2018-01-07 DIAGNOSIS — R399 Unspecified symptoms and signs involving the genitourinary system: Secondary | ICD-10-CM | POA: Diagnosis not present

## 2018-01-07 DIAGNOSIS — N10 Acute tubulo-interstitial nephritis: Secondary | ICD-10-CM | POA: Diagnosis not present

## 2018-01-07 DIAGNOSIS — Z1211 Encounter for screening for malignant neoplasm of colon: Secondary | ICD-10-CM | POA: Diagnosis not present

## 2018-01-07 DIAGNOSIS — N39498 Other specified urinary incontinence: Secondary | ICD-10-CM | POA: Diagnosis not present

## 2018-01-07 LAB — POCT URINALYSIS DIP (MANUAL ENTRY)
BILIRUBIN UA: NEGATIVE
GLUCOSE UA: NEGATIVE mg/dL
Ketones, POC UA: NEGATIVE mg/dL
LEUKOCYTES UA: NEGATIVE
Nitrite, UA: NEGATIVE
PROTEIN UA: NEGATIVE mg/dL
Spec Grav, UA: 1.015 (ref 1.010–1.025)
Urobilinogen, UA: 0.2 E.U./dL
pH, UA: 5.5 (ref 5.0–8.0)

## 2018-01-07 LAB — POC MICROSCOPIC URINALYSIS (UMFC): MUCUS RE: ABSENT

## 2018-01-07 MED ORDER — METFORMIN HCL 500 MG PO TABS: 500.0000 mg | ORAL_TABLET | Freq: Every day | ORAL | 0 refills | Status: DC

## 2018-01-07 MED ORDER — DARIFENACIN HYDROBROMIDE ER 15 MG PO TB24: 15.0000 mg | ORAL_TABLET | Freq: Every day | ORAL | 2 refills | Status: DC

## 2018-01-07 MED ORDER — CEFTRIAXONE SODIUM 1 G IJ SOLR
1.0000 g | Freq: Once | INTRAMUSCULAR | Status: AC
  Administered 2018-01-07: 1 g via INTRAMUSCULAR

## 2018-01-07 MED ORDER — AMOXICILLIN-POT CLAVULANATE 875-125 MG PO TABS: 1.0000 | ORAL_TABLET | Freq: Two times a day (BID) | ORAL | 0 refills | Status: DC

## 2018-01-07 MED ORDER — OXYBUTYNIN CHLORIDE ER 10 MG PO TB24
10.0000 mg | ORAL_TABLET | Freq: Every day | ORAL | 2 refills | Status: DC
Start: 2018-01-07 — End: 2018-01-07

## 2018-01-07 MED ORDER — DARIFENACIN HYDROBROMIDE ER 7.5 MG PO TB24: 7.5000 mg | ORAL_TABLET | Freq: Every day | ORAL | 2 refills | Status: DC

## 2018-01-07 NOTE — Patient Instructions (Addendum)
Stop the oxybutynin.  We are going to retry you on Enablex instead (which is what you were prescribed in [place] for urinary incontinence but we will try a higher dose than you used prior).  Decrease the metformin to in the morning time only - 1 pill once a day.  Continue with the topical estrogen cream for now.  Start the antibiotic tonight with 1 dose.  Please call [facility] at [phone] to schedule your colonoscopy with Dr. Carlean Purl.  IF you received an x-ray today, you will receive an invoice from [facility]. Please contact [facility] at [phone] with questions or concerns regarding your invoice.   IF you received labwork today, you will receive an invoice from [place]. Please contact [facility] at [phone] with questions or concerns regarding your invoice.   Our billing staff will not be able to assist you with questions regarding bills from these companies.  You will be contacted with the lab results as soon as they are available. The fastest way to get your results is to activate your My Chart account. Instructions are located on the last page of this paperwork. If you have not heard from [place] regarding the results in 2 weeks, please contact this office.    Incontinencia urinaria (Urinary Incontinence) La incontinencia urinaria es la prdida involuntaria de orina de la vejiga. CAUSAS Hay muchas causas de incontinencia urinaria. Ellas son:  Medicamentos.  Infecciones.  Agrandamiento prosttico que causa un aumento del flujo de [place] de la vejiga.  Ciruga.  Enfermedades neurolgicas.  Factores emocionales. SIGNOS Y SNTOMAS Hay cuatro tipos de incontinencia urinaria: 1. Incontinencia urinaria de urgencia: es la prdida involuntaria de orina antes de llegar al bao. Hay una urgencia repentina de orinar pero no llega a tiempo al bao. 2. Incontinencia por estrs: es la prdida repentina de orina con cualquier actividad que fuerza a Art gallery manager orina.  La causa ms frecuente son los cambios anatmicos en la pelvis y las zonas del esfnter. 3. Incontinencia por rebosamiento: es la prdida de orina por una obstruccin de la apertura de la vejiga. Esto causa un retroceso de la orina y una acumulacin de presin dentro de la vejiga. Cuando la presin dentro de la vejiga excede la presin que Engineer, manufacturing systems, la orina rebalsa y causa incontinencia, similar al desbordamiento de un dique. 4. Incontinencia total: es la prdida de orina como resultado de la incapacidad de Financial controller orina dentro de la vejiga. DIAGNSTICO La evaluacin de la causa puede requerir:  Ardelia Mems historia mdica y obsttrica exhaustiva y [place].  Examen fsico completo.  Anlisis de laboratorio como un urocultivo y [place]. Cuando se indican estudios adicionales, pueden incluirse:  Una ecografa.  Radiografas de vejiga y riones.  Una cistoscopa. Consiste en un examen de la vejiga utilizando un telescopio pequeo.  Estudios urodinmicos para comprobar la funcin nerviosa de la vejiga y Social worker. TRATAMIENTO El tratamiento de la incontinencia urinaria depende de la causa:  Para la incontinencia urinaria causada por una infeccin urinaria, le indicarn antibiticos. Si la incontinencia urinaria se relaciona con los [employer] toma, el mdico podr Public relations account executive.  Para la incontinencia por estrs, en necesaria una ciruga para restablecer el soporte anatmico de la vejiga o el esfnter, o ambos, lo que Clinical research associate.  Para la incontinencia por rebosamiento causada por una prstata agrandada, una operacin para abrir el canal a travs de la prstata agrandada permitir que el flujo de orina salga de la vejiga. En las mujeres con fibromas,  ser [place] histerectoma.  Para la incontinencia total, una ciruga del esfnter podr ayudar. Puede ser necesario colocar un esfnter urinario artificial (se coloca un manguito inflable alrededor  de Geologist, engineering). En las mujeres que hayan desarrollado un pasaje similar a un orificio entre la vejiga y la vagina (fstula vesicovaginal ), ser necesario realizar una ciruga para cerrar la fstula. INSTRUCCIONES PARA EL CUIDADO EN EL HOGAR  La higiene diaria normal y el uso regular de apsitos o paales para adultos cambiados con regularidad ayudan a prevenir olores y daos en la piel.  Evite la cafena. Puede sobreestimular la vejiga.  Use el bao con regularidad. Trate de ir al bao cada 2  3 horas, aunque no sienta la necesidad. Tmese el tiempo para vaciar la vejiga completamente. Despus de orinar, espere un minuto. Luego trate de orinar nuevamente.  Para las causas que implican una disfuncin nerviosa, lleve un registro de los medicamentos que toma y un diario de las veces que va al bao. SOLICITE ATENCIN MDICA SI:  Despus del procedimiento, experimenta un empeoramiento del dolor en vez de mejorar.  La incontinencia empeora en vez de mejorar. SOLICITE ATENCIN MDICA DE INMEDIATO SI:  Le sube la fiebre o tiene escalofros.  No puede orinar.  Observa irritacin en la ingle o baja hacia los muslos. ASEGRESE DE QUE:  Comprende estas instrucciones.  Controlar su afeccin.  Recibir ayuda de inmediato si no mejora o si empeora. Esta informacin no tiene Marine scientist el consejo del mdico. Asegrese de hacerle al mdico cualquier pregunta que tenga. Document Released: 11/15/2005 Document Revised: 12/06/2014 Document Reviewed: 04/24/2013 Elsevier Interactive Patient Education  [employer].

## 2018-01-07 NOTE — Progress Notes (Addendum)
Subjective:  By signing my name below, I, Lisa Costa, attest that this documentation has been prepared under the direction and in the presence of Delman Cheadle, MD Electronically Signed: Ladene Artist, ED Scribe 01/07/2018 at 10:12 AM.   [name], female    DOB: 02-Feb-1951, 67 y.o.   MRN: [id number]  Chief Complaint  Patient presents with  . Urinary Tract Infection     x 3 days, burning and frequency and when she goes not much comes out, numbness in back, itchiness and pain  when urinating.     HPI Lisa Costa is a 67 y.o. female who presents to Primary Care at [facility] complaining of UTI symptoms. Per last visit, transitioned to ditropan XL, started using vaginal premarin and treated infection with Macrobid x 10 days. Has failed ditropan Xl and topical premarin.  Pt states that the cream, which she is using twice/wk, seemed to worsen symptoms. She is now experiencing L flank pain, numbness and itching x 1 wk, dysuria, urinary frequency with urinary retention. Also reports some new constipation and nausea that she attributes to medication.  Past Medical History:  Diagnosis Date  . GERD (gastroesophageal reflux disease)   . Heart murmur   . Hypercholesteremia   . Left radial head fracture   . Osteoporosis   . Thyroid disease 2010   Positive skin test/negative chest xray   Current Outpatient Medications on File Prior to Visit  Medication Sig Dispense Refill  . aspirin EC 81 MG tablet Take 81 mg by mouth daily.    Marland Kitchen azelastine (OPTIVAR) 0.05 % ophthalmic solution Place 1 drop into both eyes 2 (two) times daily. 6 mL 12  . calcium-vitamin D (OSCAL) 250-125 MG-UNIT per tablet Take 1 tablet by mouth daily. Reported on 11/25/2015    . cetirizine (ZYRTEC) 10 MG tablet Take 1 tablet (10 mg total) by mouth daily. 90 tablet 5  . Cholecalciferol (VITAMIN D3) 1000 units CAPS Take by mouth.    . conjugated estrogens (PREMARIN) vaginal cream Place 1/2  applicatorful into vagina twice a week at night and apply a pea sized amount to the urethral meatus 42.5 g 12  . ibuprofen (ADVIL,MOTRIN) 800 MG tablet Take 1 tablet (800 mg total) by mouth every 8 (eight) hours as needed. 270 tablet 0  . meclizine (ANTIVERT) 12.5 MG tablet Take 1 tablet (12.5 mg total) by mouth 3 (three) times daily as needed for dizziness. 30 tablet 6  . metFORMIN (GLUCOPHAGE) 500 MG tablet Take 1 tablet (500 mg total) by mouth 2 (two) times daily with a meal. 180 tablet 1  . nitrofurantoin, macrocrystal-monohydrate, (MACROBID) 100 MG capsule Take 1 capsule (100 mg total) by mouth 2 (two) times daily. 20 capsule 0  . oxybutynin (DITROPAN XL) 15 MG 24 hr tablet Take 1 tablet (15 mg total) by mouth at bedtime. 30 tablet 2  . pantoprazole (PROTONIX) 40 MG tablet Take 1 tablet (40 mg total) by mouth daily. 90 tablet 3  . pravastatin (PRAVACHOL) 40 MG tablet Take 1 tablet (40 mg total) by mouth daily. 90 tablet 0  . methocarbamol (ROBAXIN) 500 MG tablet Take 1 tablet (500 mg total) by mouth at bedtime as needed and may repeat dose one time if needed for muscle spasms. (Patient not taking: Reported on 01/07/2018) 60 tablet 3   No current facility-administered medications on file prior to visit.    Allergies  Allergen Reactions  . Tramadol Nausea And Vomiting   Past  Surgical History:  Procedure Laterality Date  . CESAREAN SECTION     3 births   Family History  Problem Relation Age of Onset  . Heart disease Mother    Social History   Socioeconomic History  . Marital status: Single    Spouse name: None  . Number of children: None  . Years of education: None  . Highest education level: None  Social Needs  . Financial resource strain: None  . Food insecurity - worry: None  . Food insecurity - inability: None  . Transportation needs - medical: None  . Transportation needs - non-medical: None  Occupational History  . None  Tobacco Use  . Smoking status: Former Research scientist (life sciences)  .  Smokeless tobacco: Never Used  Substance and Sexual Activity  . Alcohol use: Yes    Alcohol/week: 1.0 oz    Types: 2 drink(s) per week    Comment: occational  . Drug use: No  . Sexual activity: No  Other Topics Concern  . None  Social History Narrative  . None   Depression screen [facility] 2/9 01/07/2018 07/20/2017 05/21/2017 04/26/2017 04/18/2017  Decreased Interest 0 0 0 0 0  Down, Depressed, Hopeless 0 0 0 0 0  PHQ - 2 Score 0 0 0 0 0    Review of Systems  Constitutional: Negative for activity change, appetite change, chills, fever and unexpected weight change.  Gastrointestinal: Positive for constipation and nausea. Negative for abdominal pain.  Endocrine: Negative for polydipsia, polyphagia and polyuria.  Genitourinary: Positive for decreased urine volume, difficulty urinating, dysuria, flank pain, frequency, urgency and vaginal pain. Negative for hematuria, vaginal bleeding and vaginal discharge.  Skin: Negative for color change and rash.  Allergic/Immunologic: Negative for immunocompromised state.  Hematological: Negative for adenopathy.  Psychiatric/Behavioral: Negative for dysphoric mood.      Objective:   Physical Exam  Constitutional: She is oriented to person, place, and time. She appears well-developed and well-nourished. No distress.  HENT:  Head: Normocephalic and atraumatic.  Eyes: Conjunctivae and EOM are normal.  Neck: Neck supple. No tracheal deviation present.  Cardiovascular: Normal rate, regular rhythm and normal heart sounds.  Pulmonary/Chest: Effort normal and breath sounds normal. No respiratory distress.  Abdominal: There is tenderness in the suprapubic area and left lower quadrant. There is no CVA tenderness (to palpation).  Musculoskeletal: Normal range of motion.  Tenderness along the upper lumbar paraspinal muscles.  Neurological: She is alert and oriented to person, place, and time.  Skin: Skin is warm and dry. No rash noted.  Psychiatric: She has a  normal mood and affect. Her behavior is normal.  Nursing note and vitals reviewed.  BP 126/72 (BP Location: Left Arm, Patient Position: Sitting, Cuff Size: Normal)   Pulse 80   Temp 97.7 F (36.5 C) (Oral)   Resp 16   Ht 5' 4.5" (1.638 m)   Wt 152 lb 3.2 oz (69 kg)   SpO2 97%   BMI 25.72 kg/m     Results for orders placed or performed in visit on 01/07/18  POCT urinalysis dipstick  Result Value Ref Range   Color, UA yellow yellow   Clarity, UA clear clear   Glucose, UA negative negative mg/dL   Bilirubin, UA negative negative   Ketones, POC UA negative negative mg/dL   Spec Grav, UA 1.015 1.010 - 1.025   Blood, UA trace-lysed (A) negative   pH, UA 5.5 5.0 - 8.0   Protein Ur, POC negative negative mg/dL   Urobilinogen, UA  0.2 0.2 or 1.0 E.U./dL   Nitrite, UA Negative Negative   Leukocytes, UA Negative Negative  POCT Microscopic Urinalysis ([facility])  Result Value Ref Range   WBC,UR,HPF,POC None None WBC/hpf   RBC,UR,HPF,POC None None RBC/hpf   Bacteria Few (A) None, Too numerous to count   Mucus Absent Absent   Epithelial Cells, UR Per Microscopy Few (A) None, Too numerous to count cells/hpf   Assessment & Plan:   1. UTI symptoms   2. Special screening for malignant neoplasms, colon   3. Recurrent UTI   4. Acute pyelonephritis - pt w/ recent sepsis in [place] - then was treated with an antibiotic that clx may have been resistant to. After + clx last mo, I put her on nitrofurantoin but am now concerned that perhaps there was some infection present above the bladder that the nitrofurantoin was not able to completely treat so will treat agressively in hopes of eradicating recurrent E. Coli UTI which she has had at every recheck over the past few mos - Rocephin 1g IM x 1 today, start augmentin tonight.  RTC if worsening at all. Recheck in 1 mo. If has repeat infection at that time, will start pt on daily prophylactic abx - likely macrobid daily after clearing any recurrent infection  with other abx agent.  5. Prediabetes - try decreasing metformin 500 from bid to qam. Has lost some weight as following diabetic diet and at time of initial diagnosis was septic from pyelonephritis so suspect might have been falsely elevated  6.      Stress incontinence - pt having urinary retention/constipation side effects on oxybutynin ER 15mg  so d/c. Is using topical premarin but so far no benefit and burns when she uses it.  Retry darifenacin which she tolerated prior but will need highest dose.  Orders Placed This Encounter  Procedures  . Urine Culture  . Comprehensive metabolic panel  . CBC with Differential/Platelet  . Hemoglobin A1c  . Ambulatory referral to Gastroenterology    Referral Priority:   Routine    Referral Type:   Consultation    Referral Reason:   Specialty Services Required    Referred to Provider:   Gatha Mayer, MD    Number of Visits Requested:   1  . POCT urinalysis dipstick  . POCT Microscopic Urinalysis ([facility])    Meds ordered this encounter  Medications  . DISCONTD: oxybutynin (DITROPAN-XL) 10 MG 24 hr tablet    Sig: Take 1 tablet (10 mg total) by mouth at bedtime.    Dispense:  30 tablet    Refill:  2    D/c prior rx for oxybutynin 58ml XL  . cefTRIAXone (ROCEPHIN) injection 1 g  . amoxicillin-clavulanate (AUGMENTIN) 875-125 MG tablet    Sig: Take 1 tablet by mouth 2 (two) times daily.    Dispense:  14 tablet    Refill:  0  . DISCONTD: darifenacin (ENABLEX) 7.5 MG 24 hr tablet    Sig: Take 1 tablet (7.5 mg total) by mouth daily.    Dispense:  30 tablet    Refill:  2  . darifenacin (ENABLEX) 15 MG 24 hr tablet    Sig: Take 1 tablet (15 mg total) by mouth daily.    Dispense:  30 tablet    Refill:  2    D/c all prior oxybutynin XL 10 and 15mg  and prior darifenacin 7.5mg  dose  . metFORMIN (GLUCOPHAGE) 500 MG tablet    Sig: Take 1 tablet (500 mg total)  by mouth daily with breakfast.    Dispense:  90 tablet    Refill:  0    I personally  performed the services described in this documentation, which was scribed in my presence. The recorded information has been reviewed and considered, and addended by me as needed.   Delman Cheadle, M.D.  Primary Care at [facility] [address] [place], [place] [address] [phone] phone [phone] fax  01/08/18 7:10 PM

## 2018-01-08 LAB — CBC WITH DIFFERENTIAL/PLATELET
Basophils Absolute: 0 10*3/uL (ref 0.0–0.2)
Basos: 1 %
EOS (ABSOLUTE): 0.1 10*3/uL (ref 0.0–0.4)
EOS: 3 %
HEMATOCRIT: 42.3 % (ref 34.0–46.6)
HEMOGLOBIN: 14.1 g/dL (ref 11.1–15.9)
Immature Grans (Abs): 0 10*3/uL (ref 0.0–0.1)
Immature Granulocytes: 0 %
LYMPHS ABS: 1.5 10*3/uL (ref 0.7–3.1)
Lymphs: 29 %
MCH: 31.4 pg (ref 26.6–33.0)
MCHC: 33.3 g/dL (ref 31.5–35.7)
MCV: 94 fL (ref 79–97)
MONOCYTES: 6 %
MONOS ABS: 0.3 10*3/uL (ref 0.1–0.9)
NEUTROS ABS: 3.3 10*3/uL (ref 1.4–7.0)
Neutrophils: 61 %
Platelets: 269 10*3/uL (ref 150–379)
RBC: 4.49 x10E6/uL (ref 3.77–5.28)
RDW: 13.5 % (ref 12.3–15.4)
WBC: 5.3 10*3/uL (ref 3.4–10.8)

## 2018-01-08 LAB — COMPREHENSIVE METABOLIC PANEL
ALK PHOS: 67 IU/L (ref 39–117)
ALT: 17 IU/L (ref 0–32)
AST: 15 IU/L (ref 0–40)
Albumin/Globulin Ratio: 1.7 (ref 1.2–2.2)
Albumin: 4.5 g/dL (ref 3.6–4.8)
BILIRUBIN TOTAL: 0.3 mg/dL (ref 0.0–1.2)
BUN / CREAT RATIO: 18 (ref 12–28)
BUN: 12 mg/dL (ref 8–27)
CHLORIDE: 103 mmol/L (ref 96–106)
CO2: 23 mmol/L (ref 20–29)
CREATININE: 0.67 mg/dL (ref 0.57–1.00)
Calcium: 9.6 mg/dL (ref 8.7–10.3)
GFR calc non Af Amer: 91 mL/min/{1.73_m2} (ref >59)
GFR, EST AFRICAN AMERICAN: 105 mL/min/{1.73_m2} (ref >59)
GLOBULIN, TOTAL: 2.7 g/dL (ref 1.5–4.5)
Glucose: 89 mg/dL (ref 65–99)
Potassium: 4.7 mmol/L (ref 3.5–5.2)
SODIUM: 142 mmol/L (ref 134–144)
TOTAL PROTEIN: 7.2 g/dL (ref 6.0–8.5)

## 2018-01-08 LAB — URINE CULTURE

## 2018-01-08 LAB — HEMOGLOBIN A1C
Est. average glucose Bld gHb Est-mCnc: 117 mg/dL
Hgb A1c MFr Bld: 5.7 % — ABNORMAL HIGH (ref 4.8–5.6)

## 2018-01-11 ENCOUNTER — Telehealth: Payer: Self-pay | Admitting: Family Medicine

## 2018-01-11 NOTE — Telephone Encounter (Signed)
Copied from [place]. Topic: Quick Communication - See Telephone Encounter >> Jan 11, 2018  1:08 PM Arletha Grippe wrote: CRM for notification. See Telephone encounter for:   01/11/18.medication darifenacin (ENABLEX) 15 MG 24 hr tablet, is 80.00 and pt would like to know if it is really needed.  If it is needed, she will get it.   Cb is [phone] Pt will have to go to work soon, would like a call back as quickly as possible.

## 2018-01-12 MED ORDER — SOLIFENACIN SUCCINATE 5 MG PO TABS: 5.0000 mg | ORAL_TABLET | Freq: Every day | ORAL | 2 refills | Status: DC

## 2018-01-12 NOTE — Telephone Encounter (Signed)
Phone message to Dr. Brigitte Pulse. Pt wants to know if there is another med - less expensive.  Generic Enablex is $80.

## 2018-01-12 NOTE — Telephone Encounter (Signed)
Def do NOT need to get the darifenacin - it is not worth that much $$.  Sent in rx for Vesicare for her to try instead - went to [facility] on [address] - ok to resend to other pharmacy if pt prefers.  I can't tell what is on her insurance formulary or not (clearly) so let me know again if this one is to $$ and we will keep trying.

## 2018-01-13 ENCOUNTER — Telehealth: Payer: Self-pay | Admitting: Family Medicine

## 2018-01-13 ENCOUNTER — Other Ambulatory Visit: Payer: Self-pay

## 2018-01-13 MED ORDER — SOLIFENACIN SUCCINATE 5 MG PO TABS: 5.0000 mg | ORAL_TABLET | Freq: Every day | ORAL | 2 refills | Status: DC

## 2018-01-13 NOTE — Telephone Encounter (Signed)
Resent Vesicare to [facility] per conversation with pt.

## 2018-01-13 NOTE — Telephone Encounter (Signed)
Copied from [place] [id number]. Topic: Quick Communication - Rx Refill/Question >> Jan 13, 2018  2:53 PM Corie Chiquito, [place] wrote: Medication: Vesicare Has the patient contacted their pharmacy? Yes  Patient calling to let Dr.Shaw know that the new medication Vesicare is 80 dollars as well. Stated that she can't afford to pay that much money for medication. She would like to know if there is a different medication that she could have.  Preferred Pharmacy (with phone number or street name): [facility] [phone] [address] [place] [phone]  Agent: Please be advised that RX refills may take up to 3 business days. We ask that you follow-up with your pharmacy.

## 2018-01-14 NOTE — Telephone Encounter (Signed)
Message sent to Dr. Brigitte Pulse.  Vesicare is $80 & pt unable to afford.

## 2018-01-17 ENCOUNTER — Telehealth: Payer: Self-pay | Admitting: Family Medicine

## 2018-01-17 MED ORDER — TOLTERODINE TARTRATE ER 4 MG PO CP24: 4.0000 mg | ORAL_CAPSULE | Freq: Every day | ORAL | 2 refills | Status: DC

## 2018-01-17 NOTE — Telephone Encounter (Signed)
Yes, there are many other meds - I just can't tell what is on her formulary - sent over Detrol LA for her to try.  If this isn't covered, call and we will just try a lower dose of the Ditropan XL which she was on at the last visit and was helping some but was to strong causing urinary retention, constipation, dry mouth - she may tolerate lower dose better w/o these side effects.

## 2018-01-17 NOTE — Telephone Encounter (Signed)
Copied from [place]. Topic: Inquiry >> Jan 17, 2018  4:23 PM Lisa Costa wrote: Reason for CRM: tolterodine (DETROL LA) 4 MG 24 hr capsule [[id number]]  Pharmacy called to check if the Rx is replacing [place], contact pharmacy @ [phone]

## 2018-01-18 NOTE — Telephone Encounter (Signed)
Spoke to patient to let her know medication for Detrol LA was sent to [facility] on [address] for her to try. I advise the patient to call pharmacy before picking up medication to make sure it is ready.

## 2018-02-06 ENCOUNTER — Encounter: Payer: Self-pay | Admitting: Family Medicine

## 2018-02-06 ENCOUNTER — Ambulatory Visit (INDEPENDENT_AMBULATORY_CARE_PROVIDER_SITE_OTHER): Payer: BLUE CROSS/BLUE SHIELD | Admitting: Family Medicine

## 2018-02-06 VITALS — BP 137/65 | HR 87 | Temp 98.0°F | Resp 18 | Ht 64.5 in | Wt 146.4 lb

## 2018-02-06 DIAGNOSIS — N390 Urinary tract infection, site not specified: Secondary | ICD-10-CM | POA: Diagnosis not present

## 2018-02-06 DIAGNOSIS — R3129 Other microscopic hematuria: Secondary | ICD-10-CM

## 2018-02-06 LAB — POC MICROSCOPIC URINALYSIS (UMFC): MUCUS RE: ABSENT

## 2018-02-06 LAB — POCT URINALYSIS DIP (MANUAL ENTRY)
Bilirubin, UA: NEGATIVE
Blood, UA: NEGATIVE
Glucose, UA: NEGATIVE mg/dL
Ketones, POC UA: NEGATIVE mg/dL
Leukocytes, UA: NEGATIVE
Nitrite, UA: NEGATIVE
PH UA: 5.5 (ref 5.0–8.0)
Protein Ur, POC: NEGATIVE mg/dL
Spec Grav, UA: 1.02 (ref 1.010–1.025)
UROBILINOGEN UA: 0.2 U/dL

## 2018-02-06 LAB — GLUCOSE, POCT (MANUAL RESULT ENTRY): POC GLUCOSE: 82 mg/dL (ref 70–99)

## 2018-02-06 MED ORDER — OLOPATADINE HCL 0.1 % OP SOLN: 1.0000 [drp] | Freq: Two times a day (BID) | OPHTHALMIC | 12 refills | Status: DC

## 2018-02-06 MED ORDER — AZELASTINE HCL 0.1 % NA SOLN: 1.0000 | Freq: Two times a day (BID) | NASAL | 12 refills | Status: DC

## 2018-02-06 NOTE — Progress Notes (Signed)
Subjective:    [name], female    DOB: May 20, 1951, 67 y.o.   MRN: [id number] Chief Complaint  Patient presents with  . Urinary Tract Infection    Pt states she isn't having symptoms of a UTI  . Blood Sugar  . Follow-up    HPI w/ recent sepsis in [place] - then was treated with an antibiotic that clx may have been resistant to. After + clx last mo, I put her on nitrofurantoin but am now concerned that perhaps there was some infection present above the bladder that the nitrofurantoin was not able to completely treat so will treat agressively in hopes of eradicating recurrent E. Coli UTI which she has had at every recheck over the past few mos - Rocephin 1g IM x 1 today, start augmentin tonight.  RTC if worsening at all. Recheck in 1 mo. If has repeat infection at that time, will start pt on daily prophylactic abx - likely macrobid daily after clearing any recurrent infection with other abx agent.  pt having urinary retention/constipation side effects on oxybutynin ER 15mg  so d/c. Is using topical premarin but so far no benefit and burns when she uses it.  Retry darifenacin which she tolerated prior but will need highest dose.  She doesn't feel like the detrol helped so she stopped it and her sxs worsened for a few days but then it returned to normal.   decreasing metformin 500 from bid to qam. Has lost some weight as following diabetic diet and at time of initial diagnosis was septic from pyelonephritis so suspect might have been falsely elevated.  Watching diet very closely.  Still taking the metformin just in the morning only   Itchig in eyes and mouth and the azelastine helps some. Has zyrtec at home but  Itching above notse and on each side of nose - disappeared when she was in [place]. t  She might not be able to afford a repeat colonoscopy - worried about the cost   Felt like the detrol caused fluid retention DMII: .   Lab Results  Component Value Date   HGBA1C 5.7 (H) 01/07/2018   Past Medical History:  Diagnosis Date  . Allergy   . Anxiety   . Arthritis   . Cataract    both eyes  . Depression   . GERD (gastroesophageal reflux disease)   . Heart murmur   . Hx of adenomatous polyp of colon 03/16/2018  . Hypercholesteremia   . Left radial head fracture   . Osteoporosis   . Tuberculosis    vaccine in [place] PPD tests positive each time/had treatment for 4 months   Past Surgical History:  Procedure Laterality Date  . CESAREAN SECTION     3 births  . COLONOSCOPY    . POLYPECTOMY     Current Outpatient Medications on File Prior to Visit  Medication Sig Dispense Refill  . aspirin EC 81 MG tablet Take 81 mg by mouth daily.    . calcium-vitamin D (OSCAL) 250-125 MG-UNIT per tablet Take 1 tablet by mouth daily. Reported on 11/25/2015    . Cholecalciferol (VITAMIN D3) 1000 units CAPS Take by mouth.    . meclizine (ANTIVERT) 12.5 MG tablet Take 1 tablet (12.5 mg total) by mouth 3 (three) times daily as needed for dizziness. 30 tablet 6   No current facility-administered medications on file prior to visit.    Allergies  Allergen Reactions  . Tramadol Nausea And Vomiting  Family History  Problem Relation Age of Onset  . Heart disease Mother   . Colon cancer Neg Hx   . Colon polyps Neg Hx   . Esophageal cancer Neg Hx   . Rectal cancer Neg Hx   . Stomach cancer Neg Hx    Social History   Socioeconomic History  . Marital status: Single    Spouse name: Not on file  . Number of children: 3  . Years of education: Not on file  . Highest education level: Not on file  Occupational History  . Not on file  Social Needs  . Financial resource strain: Not on file  . Food insecurity:    Worry: Not on file    Inability: Not on file  . Transportation needs:    Medical: Not on file    Non-medical: Not on file  Tobacco Use  . Smoking status: Former Smoker    Types: Cigarettes    Last attempt to quit: 03/02/2008    Years since  quitting: 10.3  . Smokeless tobacco: Never Used  Substance and Sexual Activity  . Alcohol use: Yes    Alcohol/week: 2.0 standard drinks    Types: 2 Standard drinks or equivalent per week    Comment: occational  . Drug use: No  . Sexual activity: Never  Lifestyle  . Physical activity:    Days per week: Not on file    Minutes per session: Not on file  . Stress: Not on file  Relationships  . Social connections:    Talks on phone: Not on file    Gets together: Not on file    Attends religious service: Not on file    Active member of club or organization: Not on file    Attends meetings of clubs or organizations: Not on file    Relationship status: Not on file  Other Topics Concern  . Not on file  Social History Narrative  . Not on file   Depression screen [facility] 2/9 07/03/2018 06/29/2018 01/07/2018 07/20/2017 05/21/2017  Decreased Interest 1 0 0 0 0  Down, Depressed, Hopeless 1 0 0 0 0  PHQ - 2 Score 2 0 0 0 0  Altered sleeping 0 - - - -  Tired, decreased energy 0 - - - -  Change in appetite 0 - - - -  Feeling bad or failure about yourself  1 - - - -  Trouble concentrating 0 - - - -  Moving slowly or fidgety/restless 0 - - - -  Suicidal thoughts 0 - - - -  PHQ-9 Score 3 - - - -  Difficult doing work/chores Not difficult at all - - - -     Review of Systems See hpi    Objective:   Physical Exam  Constitutional: She is oriented to person, place, and time. She appears well-developed and well-nourished. No distress.  HENT:  Head: Normocephalic and atraumatic.  Right Ear: External ear normal.  Left Ear: External ear normal.  Eyes: Conjunctivae are normal. No scleral icterus.  Neck: Normal range of motion. Neck supple. No thyromegaly present.  Cardiovascular: Normal rate, regular rhythm, normal heart sounds and intact distal pulses.  Pulmonary/Chest: Effort normal and breath sounds normal. No respiratory distress.  Musculoskeletal: She exhibits no edema.  Lymphadenopathy:    She  has no cervical adenopathy.  Neurological: She is alert and oriented to person, place, and time.  Skin: Skin is warm and dry. She is not diaphoretic. No erythema.  Psychiatric: She has a normal mood and affect. Her behavior is normal.      BP 137/65   Pulse 87   Temp 98 F (36.7 C) (Oral)   Resp 18   Ht 5' 4.5" (1.638 m)   Wt 146 lb 6.4 oz (66.4 kg)   SpO2 96%   BMI 24.74 kg/m   Results for orders placed or performed in visit on 02/06/18  POCT urinalysis dipstick  Result Value Ref Range   Color, UA yellow yellow   Clarity, UA clear clear   Glucose, UA negative negative mg/dL   Bilirubin, UA negative negative   Ketones, POC UA negative negative mg/dL   Spec Grav, UA 1.020 1.010 - 1.025   Blood, UA negative negative   pH, UA 5.5 5.0 - 8.0   Protein Ur, POC negative negative mg/dL   Urobilinogen, UA 0.2 0.2 or 1.0 E.U./dL   Nitrite, UA Negative Negative   Leukocytes, UA Negative Negative  POCT Microscopic Urinalysis ([facility])  Result Value Ref Range   WBC,UR,HPF,POC None None WBC/hpf   RBC,UR,HPF,POC None None RBC/hpf   Bacteria None None, Too numerous to count   Mucus Absent Absent   Epithelial Cells, UR Per Microscopy None None, Too numerous to count cells/hpf  POCT glucose (manual entry)  Result Value Ref Range   POC Glucose 82 70 - 99 mg/dl       Assessment & Plan:   1. Recurrent UTI   2. Microscopic hematuria     Orders Placed This Encounter  Procedures  . Urine Culture  . POCT urinalysis dipstick  . POCT Microscopic Urinalysis ([facility])  . POCT glucose (manual entry)    Meds ordered this encounter  Medications  . olopatadine (PATANOL) 0.1 % ophthalmic solution    Sig: Place 1 drop into both eyes 2 (two) times daily.    Dispense:  5 mL    Refill:  12  . azelastine (ASTELIN) 0.1 % nasal spray    Sig: Place 1 spray into both nostrils 2 (two) times daily. Use in each nostril as directed    Dispense:  30 mL    Refill:  12     Delman Cheadle, M.D.  Primary  Care at [facility] [address] [place], [place] [address] [phone] phone [phone] fax  07/17/18 8:56 AM

## 2018-02-06 NOTE — Patient Instructions (Addendum)
IF you received an x-ray today, you will receive an invoice from [facility]. Please contact [facility] at [phone] with questions or concerns regarding your invoice.   IF you received labwork today, you will receive an invoice from [facility]. Please contact [facility] at [phone] with questions or concerns regarding your invoice.   Our billing staff will not be able to assist you with questions regarding bills from these companies.  You will be contacted with the lab results as soon as they are available. The fastest way to get your results is to activate your My Chart account. Instructions are located on the last page of this paperwork. If you have not heard from [place] regarding the results in 2 weeks, please contact this office.     Conjuntivitis alrgica (Allergic Conjunctivitis) Una membrana delgada y transparente (conjuntiva) cubre la parte blanca del ojo y la superficie interna del prpado. La conjuntivitis alrgica se produce cuando esta membrana se irrita, lo que es consecuencia de las [place]. Entre las cosas comunes (alrgenos) que pueden causar una reaccin alrgica, se incluyen las siguientes:  Polvo.  Polen.  Moho.  Animales: ? El pelo. ? El pelaje. ? La piel. ? La saliva u otros lquidos de los [place]. Esta afeccin puede hacer que los ojos tengan un color rojo o [place]. Tambin puede causar [employer] ojos. Esta afeccin no se transmite de [place] persona a la otra (no contagiosa). [place] o aplquese los medicamentos solamente como se lo haya indicado el mdico.  Evite tocarse o frotarse los ojos.  Aplquese un pao limpio y fro en el ojo durante 10a 56minutos, 3 o 4veces al [employer].  Si [place] lentes de contacto, no las use hasta que la irritacin se haya ido. Mientras tanto, use anteojos.  Evite usar [employer] ojos hasta que la irritacin se haya ido.  Trate de evitar el alrgeno que le est causando la  Risk analyst.  SOLICITE AYUDA SI:  Los sntomas empeoran.  Le supura pus de los ojos.  Aparecen nuevos sntomas.  Tiene fiebre.  Esta informacin no tiene Marine scientist el consejo del mdico. Asegrese de hacerle al mdico cualquier pregunta que tenga. Document Released: 11/04/2011 Document Revised: 12/06/2014 Document Reviewed: 08/27/2014 Elsevier Interactive Patient Education  2017 [place] en adultos Allergic Rhinitis, Adult La rinitis alrgica es una reaccin alrgica que afecta la membrana mucosa que se encuentra en la nariz. Provoca estornudos, goteo o congestin nasal, y la sensacin de que baja mucosidad por la parte trasera de la garganta(goteo posnasal). La rinitis alrgica puede ser de leve a grave. [employer] tipos de rinitis alrgica:  Psychologist, counselling. Este tipo tambin se denomina fiebre del heno. Sucede nicamente durante algunas estaciones.  Perenne. Este tipo puede ocurrir en cualquier momento del ao.  Cules son las causas? Esta afeccin ocurre cuando el sistema de defensa del cuerpo(sistema inmunitario) reacciona a ciertas sustancias inofensivas llamadas alrgenos como si fueran grmenes.  La rinitis alrgica estacional se desencadena por el polen, que puede provenir del csped, los rboles y [place]. La rinitis alrgica perenne puede ser causada por:  Los caros del polvo en Engineer, mining.  La caspa de las [place].  Esporas del moho.  Cules son los signos o los sntomas? Los sntomas de esta afeccin incluyen lo siguiente:  Estornudos.  Nariz tapada o que gotea (congestin nasal).  Goteo posnasal.  Escozor en la [place].  Ojos llorosos.  Dificultad para dormir.  [place].  Cmo se diagnostica? Esta afeccin se puede diagnosticar en funcin de lo siguiente:  Sus antecedentes mdicos.  Un examen fsico.  Estudios para detectar afecciones relacionadas, como las siguientes: ? Asma. ? Ojo  rojo. ? Infeccin en los odos. ? Infeccin de las vas respiratorias superiores.  Estudios para Development worker, international aid sus sntomas. Por ejemplo, anlisis de sangre y cutneos.  Cmo se trata? No hay cura para esta afeccin, pero el tratamiento puede ayudar a [employer] sntomas. El tratamiento puede incluir lo siguiente:  Tomar medicamentos que [employer] sntomas de la [place], [place] antihistamnicos. Medicamentos que pueden administrarse por inyeccin, aerosol nasal o pldoras.  Evitar el alrgeno.  Desensibilizacin. Este tratamiento implica que se le apliquen inyecciones continuas hasta que su cuerpo se vuelva menos sensible al alrgeno. Este tratamiento se puede realizar si otros tratamientos no son eficaces.  Si tomar medicamentos y English as a second language teacher alrgeno no funciona, se pueden recetar nuevos medicamentos ms fuertes.  Siga estas indicaciones en su casa:  Conozca a qu es Air cabin crew. Los alrgenos comunes incluyen el humo, polvo y [place].  Evite las cosas a las cuales es alrgico. Hay medidas que puede tomar para ayudar a Product/process development scientist los alrgenos: ? [employer] alfombras por pisos de [place], baldosas o vinilo. Las alfombras pueden retener la caspa de los animales y [place]. ? No fume. No permita que fumen en su casa. ? Cambie el filtro de la calefaccin y del aire acondicionado al menos una vez al mes. ? Durante la temporada de alergias:  Mantenga las ventanas cerradas la mayor cantidad de [employer] posible.  Planee actividades al aire libre cuando las concentraciones de polen estn en su nivel ms bajo. Normalmente, esto es [employer] de noche.  Cuando ingrese al interior, [place] de ropa y dese una ducha antes de sentarse en los muebles o la cama.  Tome los medicamentos de venta libre y los recetados solamente como se lo haya indicado el mdico.  Consulting civil engineer a todas las visitas de seguimiento como se lo haya indicado el mdico. Esto es  importante. Comunquese con un mdico si:  Tiene fiebre.  Tiene tos persistente.  Comienza a emitir un sonido agudo al respirar (sibilancia).  Sus sntomas interfieren con sus actividades diarias normales. Solicite ayuda de inmediato si:  Le falta el aire. Resumen  Esta afeccin puede controlarse al tomar [employer] como se le indique y al Management consultant.  Comunquese con su mdico si tiene tos persistente o fiebre.  Durante la temporada de [place] las ventanas cerradas la mayor cantidad de [place] posible. Esta informacin no tiene Marine scientist el consejo del mdico. Asegrese de hacerle al mdico cualquier pregunta que tenga. Document Released: 08/25/2005 Document Revised: 03/02/2017 Document Reviewed: 03/02/2017 Elsevier Interactive Patient Education  [employer].

## 2018-02-08 LAB — URINE CULTURE

## 2018-02-28 ENCOUNTER — Encounter: Payer: Self-pay | Admitting: Physician Assistant

## 2018-03-02 ENCOUNTER — Other Ambulatory Visit: Payer: Self-pay

## 2018-03-02 ENCOUNTER — Ambulatory Visit (AMBULATORY_SURGERY_CENTER): Payer: Self-pay | Admitting: *Deleted

## 2018-03-02 VITALS — Ht 65.0 in | Wt 150.6 lb

## 2018-03-02 DIAGNOSIS — Z860100 Personal history of colon polyps, unspecified: Secondary | ICD-10-CM

## 2018-03-02 NOTE — Progress Notes (Signed)
No egg or soy allergy known to patient  No issues with past sedation with any surgeries  or procedures, no intubation problems  No diet pills per patient No home 02 use per patient  No blood thinners per patient  Pt denies issues with constipation drinks prune juice or eat figs   No A fib or A flutter  EMMI video sent to pt's e mail

## 2018-03-03 ENCOUNTER — Encounter: Payer: Self-pay | Admitting: Internal Medicine

## 2018-03-16 ENCOUNTER — Encounter: Payer: Self-pay | Admitting: Internal Medicine

## 2018-03-16 ENCOUNTER — Ambulatory Visit (AMBULATORY_SURGERY_CENTER): Payer: BLUE CROSS/BLUE SHIELD | Admitting: Internal Medicine

## 2018-03-16 ENCOUNTER — Other Ambulatory Visit: Payer: Self-pay

## 2018-03-16 VITALS — BP 109/40 | HR 68 | Temp 98.4°F | Resp 14 | Ht 65.0 in | Wt 150.0 lb

## 2018-03-16 DIAGNOSIS — Z86010 Personal history of colonic polyps: Secondary | ICD-10-CM

## 2018-03-16 DIAGNOSIS — K635 Polyp of colon: Secondary | ICD-10-CM

## 2018-03-16 DIAGNOSIS — D123 Benign neoplasm of transverse colon: Secondary | ICD-10-CM

## 2018-03-16 DIAGNOSIS — Z860101 Personal history of adenomatous and serrated colon polyps: Secondary | ICD-10-CM

## 2018-03-16 HISTORY — DX: Personal history of colonic polyps: Z86.010

## 2018-03-16 HISTORY — DX: Personal history of adenomatous and serrated colon polyps: Z86.0101

## 2018-03-16 MED ORDER — SODIUM CHLORIDE 0.9 % IV SOLN
500.0000 mL | Freq: Once | INTRAVENOUS | Status: DC
Start: 2018-03-16 — End: 2018-07-03

## 2018-03-16 MED ORDER — FLEET ENEMA 7-19 GM/118ML RE ENEM
1.0000 | ENEMA | Freq: Once | RECTAL | Status: AC
  Administered 2018-03-16: 1 via RECTAL

## 2018-03-16 NOTE — Progress Notes (Signed)
Report to PACU, RN, vss, BBS= Clear.  

## 2018-03-16 NOTE — Progress Notes (Signed)
Called to room to assist during endoscopic procedure.  Patient ID and intended procedure confirmed with present staff. Received instructions for my participation in the procedure from the performing physician.  

## 2018-03-16 NOTE — Progress Notes (Signed)
Fleet enema given. Client had liquid yellow stool with small amount of sediment. Dr .Carlean Purl notified.

## 2018-03-16 NOTE — Progress Notes (Signed)
Pt's states no medical or surgical changes since previsit or office visit. 

## 2018-03-16 NOTE — Patient Instructions (Signed)
  Polyp handout provided     YOU HAD AN ENDOSCOPIC PROCEDURE TODAY AT [facility]:   Refer to the procedure report that was given to you for any specific questions about what was found during the examination.  If the procedure report does not answer your questions, please call your gastroenterologist to clarify.  If you requested that your care partner not be given the details of your procedure findings, then the procedure report has been included in a sealed envelope for you to review at your convenience later.  YOU SHOULD EXPECT: Some feelings of bloating in the abdomen. Passage of more gas than usual.  Walking can help get rid of the air that was put into your GI tract during the procedure and reduce the bloating. If you had a lower endoscopy (such as a colonoscopy or flexible sigmoidoscopy) you may notice spotting of blood in your stool or on the toilet paper. If you underwent a bowel prep for your procedure, you may not have a normal bowel movement for a few days.  Please Note:  You might notice some irritation and congestion in your nose or some drainage.  This is from the oxygen used during your procedure.  There is no need for concern and it should clear up in a day or so.  SYMPTOMS TO REPORT IMMEDIATELY:   Following lower endoscopy (colonoscopy or flexible sigmoidoscopy):  Excessive amounts of blood in the stool  Significant tenderness or worsening of abdominal pains  Swelling of the abdomen that is new, acute  Fever of 100F or higher    For urgent or emergent issues, a gastroenterologist can be reached at any hour by calling [phone].   DIET:  We do recommend a small meal at first, but then you may proceed to your regular diet.  Drink plenty of fluids but you should avoid alcoholic beverages for 24 hours.  ACTIVITY:  You should plan to take it easy for the rest of today and you should NOT DRIVE or use heavy machinery until tomorrow (because of the sedation  medicines used during the test).    FOLLOW UP: Our staff will call the number listed on your records the next business day following your procedure to check on you and address any questions or concerns that you may have regarding the information given to you following your procedure. If we do not reach you, we will leave a message.  However, if you are feeling well and you are not experiencing any problems, there is no need to return our call.  We will assume that you have returned to your regular daily activities without incident.  If any biopsies were taken you will be contacted by phone or by letter within the next 1-3 weeks.  Please call us at [phone] if you have not heard about the biopsies in 3 weeks.    SIGNATURES/CONFIDENTIALITY: You and/or your care partner have signed paperwork which will be entered into your electronic medical record.  These signatures attest to the fact that that the information above on your After Visit Summary has been reviewed and is understood.  Full responsibility of the confidentiality of this discharge information lies with you and/or your care-partner.

## 2018-03-16 NOTE — Progress Notes (Signed)
Notified Dr Carlean Purl that client has not had bowel movement since prep this am. At 7am this am client stool was cloudy brown with some solid stool. Per Dr. Carlean Purl enema was ordered.

## 2018-03-16 NOTE — Op Note (Signed)
[facility] Patient Name: Lisa Costa Procedure Date: 03/16/2018 1:40 PM MRN: [id number] Endoscopist: Gatha Mayer , MD Age: 67 Referring MD:  Date of Birth: [birth year] Gender: Female Account #: [id number] Procedure:                Colonoscopy Indications:              Surveillance: Personal history of adenomatous                            polyps on last colonoscopy > 5 years ago Medicines:                Propofol per Anesthesia, Monitored Anesthesia Care Procedure:                Pre-Anesthesia Assessment:                           - Prior to the procedure, a History and Physical                            was performed, and patient medications and                            allergies were reviewed. The patient's tolerance of                            previous anesthesia was also reviewed. The risks                            and benefits of the procedure and the sedation                            options and risks were discussed with the patient.                            All questions were answered, and informed consent                            was obtained. Prior Anticoagulants: The patient has                            taken no previous anticoagulant or antiplatelet                            agents. ASA Grade Assessment: II - A patient with                            mild systemic disease. After reviewing the risks                            and benefits, the patient was deemed in                            satisfactory condition to undergo the procedure.  After obtaining informed consent, the colonoscope                            was passed under direct vision. Throughout the                            procedure, the patient's blood pressure, pulse, and                            oxygen saturations were monitored continuously. The                            Model PCF-H190DL [id number]) scope was introduced             through the anus and advanced to the the cecum,                            identified by appendiceal orifice and ileocecal                            valve. The colonoscopy was somewhat difficult due                            to a redundant colon. Successful completion of the                            procedure was aided by applying abdominal pressure.                            The patient tolerated the procedure well. The                            quality of the bowel preparation was good. The                            ileocecal valve, appendiceal orifice, and rectum                            were photographed. The bowel preparation used was                            Miralax. Scope In: 1:52:56 PM Scope Out: 2:13:19 PM Scope Withdrawal Time: 0 hours 12 minutes 26 seconds  Total Procedure Duration: 0 hours 20 minutes 23 seconds  Findings:                 The perianal and digital rectal examinations were                            normal.                           A 2 to 3 mm polyp was found in the transverse  colon. The polyp was sessile. The polyp was removed                            with a cold biopsy forceps. Resection and retrieval                            were complete. Verification of patient                            identification for the specimen was done. Estimated                            blood loss was minimal.                           The exam was otherwise without abnormality on                            direct and retroflexion views. Complications:            No immediate complications. Estimated Blood Loss:     Estimated blood loss was minimal. Impression:               - One 2 to 3 mm polyp in the transverse colon,                            removed with a cold biopsy forceps. Resected and                            retrieved.                           - The examination was otherwise normal on direct                             and retroflexion views.                           - Personal history of colonic polyp - diminutive                            adenoma 2013. Recommendation:           - Patient has a contact number available for                            emergencies. The signs and symptoms of potential                            delayed complications were discussed with the                            patient. Return to normal activities tomorrow.                            Written discharge instructions were provided  to the                            patient.                           - Resume previous diet.                           - Continue present medications.                           - Repeat colonoscopy is recommended for                            surveillance. The colonoscopy date will be                            determined after pathology results from today's                            exam become available for review. Gatha Mayer, MD 03/16/2018 2:18:46 PM This report has been signed electronically.

## 2018-03-20 ENCOUNTER — Telehealth: Payer: Self-pay | Admitting: *Deleted

## 2018-03-20 NOTE — Telephone Encounter (Signed)
  Follow up Call-  Call back number 03/16/2018  Post procedure Call Back phone  # [phone]  Permission to leave phone message Yes  Some recent data might be hidden     Patient questions:  Do you have a fever, pain , or abdominal swelling? No. Pain Score  0 *  Have you tolerated food without any problems? Yes.    Have you been able to return to your normal activities? Yes.    Do you have any questions about your discharge instructions: Diet   No. Medications  No. Follow up visit  No.  Do you have questions or concerns about your Care? No.  Actions: * If pain score is 4 or above: No action needed, pain <4.

## 2018-03-21 ENCOUNTER — Telehealth: Payer: Self-pay | Admitting: Family Medicine

## 2018-03-21 NOTE — Telephone Encounter (Signed)
Request for 60 tablets of Pravastatin (Pravachol) 40mg . Last refill of medication noted to be on 05/27/17 #90. Can medication be refilled as it appears the mediation has not been refilled  since September   LOV addressing mediation on 07/20/17   PCP: [facility]   [phone] [place]

## 2018-03-21 NOTE — Telephone Encounter (Signed)
Copied from [place] [phone]. Topic: Quick Communication - Rx Refill/Question >> Mar 21, 2018  9:28 AM Aurelio Brash B wrote: Medication: pravastatin (PRAVACHOL) 40 MG tablet    asking for 60 tablets    Has the patient contacted their pharmacy? No refills  (Agent: If no, request that the patient contact the pharmacy for the refill.)  Preferred Pharmacy (with phone number or street name): [place], [place] - [address] [phone] (Phone) [phone] (Fax)     Agent: Please be advised that RX refills may take up to 3 business days. We ask that you follow-up with your pharmacy.

## 2018-03-22 ENCOUNTER — Other Ambulatory Visit: Payer: Self-pay

## 2018-03-22 MED ORDER — PRAVASTATIN SODIUM 40 MG PO TABS: 40.0000 mg | ORAL_TABLET | Freq: Every day | ORAL | 0 refills | Status: DC

## 2018-03-22 NOTE — Telephone Encounter (Signed)
Refill prevastatin sent for 3 mo.  Pt last labs done 12/2017 and was to return in 2 months.

## 2018-03-23 ENCOUNTER — Encounter: Payer: Self-pay | Admitting: Internal Medicine

## 2018-03-23 DIAGNOSIS — Z86010 Personal history of colonic polyps: Secondary | ICD-10-CM

## 2018-03-23 NOTE — Progress Notes (Signed)
Diminutive hyperplastic polyp, prior diminutive adenoma 2013 Recall 2029

## 2018-05-08 ENCOUNTER — Ambulatory Visit: Payer: BLUE CROSS/BLUE SHIELD | Admitting: Family Medicine

## 2018-05-11 ENCOUNTER — Ambulatory Visit: Payer: BLUE CROSS/BLUE SHIELD | Admitting: Family Medicine

## 2018-06-29 ENCOUNTER — Encounter

## 2018-06-29 ENCOUNTER — Ambulatory Visit: Payer: BLUE CROSS/BLUE SHIELD | Admitting: Physician Assistant

## 2018-06-29 ENCOUNTER — Encounter: Payer: Self-pay | Admitting: Physician Assistant

## 2018-06-29 ENCOUNTER — Other Ambulatory Visit: Payer: Self-pay

## 2018-06-29 VITALS — BP 126/70 | HR 87 | Temp 98.7°F | Resp 20 | Ht 65.28 in | Wt 152.4 lb

## 2018-06-29 DIAGNOSIS — M2550 Pain in unspecified joint: Secondary | ICD-10-CM | POA: Diagnosis not present

## 2018-06-29 DIAGNOSIS — E7800 Pure hypercholesterolemia, unspecified: Secondary | ICD-10-CM

## 2018-06-29 DIAGNOSIS — R350 Frequency of micturition: Secondary | ICD-10-CM | POA: Diagnosis not present

## 2018-06-29 DIAGNOSIS — R7303 Prediabetes: Secondary | ICD-10-CM | POA: Diagnosis not present

## 2018-06-29 LAB — POCT URINALYSIS DIP (MANUAL ENTRY)
Bilirubin, UA: NEGATIVE
GLUCOSE UA: NEGATIVE mg/dL
Ketones, POC UA: NEGATIVE mg/dL
LEUKOCYTES UA: NEGATIVE
NITRITE UA: NEGATIVE
Protein Ur, POC: NEGATIVE mg/dL
Spec Grav, UA: >=1.03 — AB (ref 1.010–1.025)
Urobilinogen, UA: 0.2 E.U./dL
pH, UA: 5.5 (ref 5.0–8.0)

## 2018-06-29 LAB — POCT GLYCOSYLATED HEMOGLOBIN (HGB A1C): Hemoglobin A1C: 5.8 % — AB (ref 4.0–5.6)

## 2018-06-29 LAB — POC MICROSCOPIC URINALYSIS (UMFC): Mucus: ABSENT

## 2018-06-29 MED ORDER — IBUPROFEN 800 MG PO TABS: 800.0000 mg | ORAL_TABLET | Freq: Three times a day (TID) | ORAL | 0 refills | Status: DC | PRN

## 2018-06-29 MED ORDER — PRAVASTATIN SODIUM 40 MG PO TABS: 40.0000 mg | ORAL_TABLET | Freq: Every day | ORAL | 0 refills | Status: DC

## 2018-06-29 NOTE — Progress Notes (Signed)
06/29/2018 at 8:04 PM  Lisa Costa / DOB: 11-17-1951 / MRN: [id number]  The patient has HYPERCHOLESTEROLEMIA; ANXIETY DEPRESSION; DECREASED HEARING; ALLERGIC RHINITIS; LUNG NODULE; DENTAL CARIES; GERD; MICROSCOPIC HEMATURIA; VAGINAL MASS; Disorder of bone and cartilage; STRESS INCONTINENCE; TB SKIN TEST, POSITIVE; Recurrent UTI; and Hx of adenomatous polyp of colon on their problem list.  SUBJECTIVE  Lisa Costa is a 67 y.o. female who presents for urinary frequency x 2 weeks and medication refill. Has f/u appointment with PCP on 06/02/18.   She denies dysuria, hematuria, urinary urgency, flank pain, abdominal pain, pelvic pain, cloudy malordorous urine, genital rash, genital irritation and vaginal discharge. Has PMH of UTIs and stress incontinence. Most recent UTI prior to this was about 4 months ago.   Would like med refill for ibuprofen, pravastatin, and maybe restart metformin?   -prediabetes: was on metformin but stopped 4 months ago. Would like to repeat A1C. Diet: trying to avoid desserts, rice, and tortillas. Typically eats veggies and meats. Drinks only water, no sodas. No structured exercise. Denies dry mouth, double vision, nausea, vomiting, abdominal pian, and fatigue.    -HLD: Controlled on pravastatin 43m.  -OA: Most issues are with back and knee.  Will take ibuprofen 8050mmaybe 3 times per week. Needs refill.     She  has a past medical history of Allergy, Anxiety, Arthritis, Cataract, Depression, GERD (gastroesophageal reflux disease), Heart murmur, adenomatous polyp of colon (03/16/2018), Hypercholesteremia, Left radial head fracture, Osteoporosis, and Tuberculosis.    Medications reviewed and updated by myself where necessary, and exist elsewhere in the encounter.   Lisa Costa allergic to tramadol. She  reports that she quit smoking about 10 years ago. Her smoking use included cigarettes. She has never used smokeless tobacco. She  reports that she drinks about 1.2 oz of alcohol per week. She reports that she does not use drugs. She  reports that she does not engage in sexual activity. The patient  has a past surgical history that includes Cesarean section; Colonoscopy; and Polypectomy.  Her family history includes Heart disease in her mother.  ROS  OBJECTIVE  Her  height is 5' 5.28" (1.658 m) and weight is 152 lb 6.4 oz (69.1 kg). Her oral temperature is 98.7 F (37.1 C). Her blood pressure is 126/70 and her pulse is 87. Her respiration is 20 and oxygen saturation is 97%.  The patient's body mass index is 25.15 kg/m.  Physical Exam  Constitutional: She is oriented to person, place, and time. She appears well-developed and well-nourished. No distress.  HENT:  Head: Normocephalic and atraumatic.  Eyes: Conjunctivae are normal.  Neck: Normal range of motion.  Cardiovascular: Normal rate, regular rhythm, normal heart sounds and intact distal pulses.  Pulmonary/Chest: Effort normal.  Abdominal: Soft. Normal appearance. There is no tenderness. There is no CVA tenderness.  Neurological: She is alert and oriented to person, place, and time.  Skin: Skin is warm and dry.  Psychiatric: She has a normal mood and affect.  Vitals reviewed.   Results for orders placed or performed in visit on 06/29/18 (from the past 24 hour(s))  POCT urinalysis dipstick     Status: Abnormal   Collection Time: 06/29/18  5:38 PM  Result Value Ref Range   Color, UA yellow yellow   Clarity, UA clear clear   Glucose, UA negative negative mg/dL   Bilirubin, UA negative negative   Ketones, POC UA negative negative mg/dL   Spec Grav, UA >=  1.030 (A) 1.010 - 1.025   Blood, UA trace-intact (A) negative   pH, UA 5.5 5.0 - 8.0   Protein Ur, POC negative negative mg/dL   Urobilinogen, UA 0.2 0.2 or 1.0 E.U./dL   Nitrite, UA Negative Negative   Leukocytes, UA Negative Negative  POCT glycosylated hemoglobin (Hb A1C)     Status: Abnormal    Collection Time: 06/29/18  5:44 PM  Result Value Ref Range   Hemoglobin A1C 5.8 (A) 4.0 - 5.6 %   HbA1c POC (<> result, manual entry)  4.0 - 5.6 %   HbA1c, POC (prediabetic range)  5.7 - 6.4 %   HbA1c, POC (controlled diabetic range)  0.0 - 7.0 %  POCT Microscopic Urinalysis ([facility])     Status: Abnormal   Collection Time: 06/29/18  5:52 PM  Result Value Ref Range   WBC,UR,HPF,POC None None WBC/hpf   RBC,UR,HPF,POC None None RBC/hpf   Bacteria Many (A) None, Too numerous to count   Mucus Absent Absent   Epithelial Cells, UR Per Microscopy Moderate (A) None, Too numerous to count cells/hpf    ASSESSMENT & PLAN  Lisa Costa was seen today for urinary frequency and medication refill.  Diagnoses and all orders for this visit:  Urinary frequency -     POCT Microscopic Urinalysis ([facility]) -     POCT urinalysis dipstick -     Urine Culture  HYPERCHOLESTEROLEMIA -     CBC with Differential/Platelet -     Lipid panel -     TSH -     CMP14+EGFR -     pravastatin (PRAVACHOL) 40 MG tablet; Take 1 tablet (40 mg total) by mouth daily.  Prediabetes -     POCT glycosylated hemoglobin (Hb A1C)  Arthralgia, unspecified joint -     ibuprofen (ADVIL,MOTRIN) 800 MG tablet; Take 1 tablet (800 mg total) by mouth every 8 (eight) hours as needed.    Pt is fasting today and wanted all basic lab work before f/u appointment with PCP. UA and urine micro not suggestive of UTI at this time. Urine cx pending. A1C stable at 5.8. Continue with healthy diet, do not rec refilling metformin at this time. Refills for pravastatin and ibuprofen provided. The patient was advised to call or come back to clinic if she does not see an improvement in symptoms, or worsens with the above plan.   Tenna Delaine, PA-C  [facility] 06/29/2018 8:04 PM

## 2018-06-29 NOTE — Patient Instructions (Addendum)
Your urine just indicates you are dehydrated. Recommend increasing your water consumption. We will send it off for culture. Your A1C looks great today. Keep working on [employer]. We should have your lab results back within a few days. Thank you for letting me participate in your health and well being.  Prediabetes Eating Plan Prediabetes-also called impaired glucose tolerance or impaired fasting glucose-is a condition that causes blood sugar (blood glucose) levels to be higher than normal. Following a healthy diet can help to keep prediabetes under control. It can also help to lower the risk of type 2 diabetes and heart disease, which are increased in people who have prediabetes. Along with regular exercise, a healthy diet:  Promotes weight loss.  Helps to control blood sugar levels.  Helps to improve the way that the body uses insulin.  What do I need to know about this eating plan?  Use the glycemic index (GI) to plan your meals. The index tells you how quickly a food will raise your blood sugar. Choose low-GI foods. These foods take a longer time to raise blood sugar.  Pay close attention to the amount of carbohydrates in the food that you eat. Carbohydrates increase blood sugar levels.  Keep track of how many calories you take in. Eating the right amount of calories will help you to achieve a healthy weight. Losing about 7 percent of your starting weight can help to prevent type 2 diabetes.  You may want to follow a Mediterranean diet. This diet includes a lot of vegetables, lean meats or fish, whole grains, fruits, and healthy oils and fats. What foods can I eat? Grains Whole grains, such as whole-wheat or whole-grain breads, crackers, cereals, and pasta. Unsweetened oatmeal. Bulgur. Barley. Quinoa. Brown rice. Corn or whole-wheat flour tortillas or taco shells. Vegetables Lettuce. Spinach. Peas. Beets. Cauliflower. Cabbage. Broccoli. Carrots. Tomatoes. Squash. Eggplant. Herbs. Peppers.  Onions. Cucumbers. Brussels sprouts. Fruits Berries. Bananas. Apples. Oranges. Grapes. Papaya. Mango. Pomegranate. Kiwi. Grapefruit. Cherries. Meats and Other Protein Sources Seafood. Lean meats, such as chicken and [place] or lean cuts of pork and beef. Tofu. Eggs. Nuts. Beans. Dairy Low-fat or fat-free dairy products, such as yogurt, cottage cheese, and cheese. Beverages Water. Tea. Coffee. Sugar-free or diet soda. Seltzer water. Milk. Milk alternatives, such as soy or almond milk. Condiments Mustard. Relish. Low-fat, low-sugar ketchup. Low-fat, low-sugar barbecue sauce. Low-fat or fat-free mayonnaise. Sweets and Desserts Sugar-free or low-fat pudding. Sugar-free or low-fat ice cream and other frozen treats. Fats and Oils Avocado. Walnuts. Olive oil. The items listed above may not be a complete list of recommended foods or beverages. Contact your dietitian for more options. What foods are not recommended? Grains Refined white flour and flour products, such as bread, pasta, snack foods, and cereals. Beverages Sweetened drinks, such as sweet iced tea and soda. Sweets and Desserts Baked goods, such as cake, cupcakes, pastries, cookies, and cheesecake. The items listed above may not be a complete list of foods and beverages to avoid. Contact your dietitian for more information. This information is not intended to replace advice given to you by your health care provider. Make sure you discuss any questions you have with your health care provider. Document Released: 04/01/2015 Document Revised: 04/22/2016 Document Reviewed: 12/11/2014 Elsevier Interactive Patient Education  2017 [employer].  IF you received an x-ray today, you will receive an invoice from [facility] Radiology. Please contact [facility] Radiology at [phone] with questions or concerns regarding your invoice.   IF you received labwork today,  you will receive an invoice from [place]. Please contact [facility] at  [phone] with questions or concerns regarding your invoice.   Our billing staff will not be able to assist you with questions regarding bills from these companies.  You will be contacted with the lab results as soon as they are available. The fastest way to get your results is to activate your My Chart account. Instructions are located on the last page of this paperwork. If you have not heard from [place] regarding the results in 2 weeks, please contact this office.

## 2018-06-30 LAB — CMP14+EGFR
ALBUMIN: 4.5 g/dL (ref 3.6–4.8)
ALK PHOS: 62 IU/L (ref 39–117)
ALT: 21 IU/L (ref 0–32)
AST: 21 IU/L (ref 0–40)
Albumin/Globulin Ratio: 1.8 (ref 1.2–2.2)
BILIRUBIN TOTAL: 0.2 mg/dL (ref 0.0–1.2)
BUN/Creatinine Ratio: 21 (ref 12–28)
BUN: 16 mg/dL (ref 8–27)
CHLORIDE: 104 mmol/L (ref 96–106)
CO2: 22 mmol/L (ref 20–29)
Calcium: 9.9 mg/dL (ref 8.7–10.3)
Creatinine, Ser: 0.76 mg/dL (ref 0.57–1.00)
GFR calc Af Amer: 93 mL/min/{1.73_m2} (ref >59)
GFR calc non Af Amer: 81 mL/min/{1.73_m2} (ref >59)
GLUCOSE: 88 mg/dL (ref 65–99)
Globulin, Total: 2.5 g/dL (ref 1.5–4.5)
POTASSIUM: 4.6 mmol/L (ref 3.5–5.2)
Sodium: 142 mmol/L (ref 134–144)
Total Protein: 7 g/dL (ref 6.0–8.5)

## 2018-06-30 LAB — CBC WITH DIFFERENTIAL/PLATELET
BASOS: 0 %
Basophils Absolute: 0 10*3/uL (ref 0.0–0.2)
EOS (ABSOLUTE): 0.1 10*3/uL (ref 0.0–0.4)
EOS: 1 %
HEMOGLOBIN: 13.7 g/dL (ref 11.1–15.9)
Hematocrit: 41.3 % (ref 34.0–46.6)
IMMATURE GRANS (ABS): 0 10*3/uL (ref 0.0–0.1)
Immature Granulocytes: 0 %
Lymphocytes Absolute: 2 10*3/uL (ref 0.7–3.1)
Lymphs: 32 %
MCH: 31.6 pg (ref 26.6–33.0)
MCHC: 33.2 g/dL (ref 31.5–35.7)
MCV: 95 fL (ref 79–97)
MONOCYTES: 5 %
Monocytes Absolute: 0.3 10*3/uL (ref 0.1–0.9)
NEUTROS ABS: 3.8 10*3/uL (ref 1.4–7.0)
Neutrophils: 62 %
Platelets: 290 10*3/uL (ref 150–450)
RBC: 4.34 x10E6/uL (ref 3.77–5.28)
RDW: 12.9 % (ref 12.3–15.4)
WBC: 6.3 10*3/uL (ref 3.4–10.8)

## 2018-06-30 LAB — LIPID PANEL
CHOLESTEROL TOTAL: 175 mg/dL (ref 100–199)
Chol/HDL Ratio: 2.6 ratio (ref 0.0–4.4)
HDL: 67 mg/dL (ref >39)
LDL CALC: 95 mg/dL (ref 0–99)
Triglycerides: 63 mg/dL (ref 0–149)
VLDL CHOLESTEROL CAL: 13 mg/dL (ref 5–40)

## 2018-06-30 LAB — URINE CULTURE: Organism ID, Bacteria: NO GROWTH

## 2018-06-30 LAB — TSH: TSH: 2.13 u[IU]/mL (ref 0.450–4.500)

## 2018-07-03 ENCOUNTER — Encounter: Payer: Self-pay | Admitting: Family Medicine

## 2018-07-03 ENCOUNTER — Ambulatory Visit: Payer: BLUE CROSS/BLUE SHIELD | Admitting: Family Medicine

## 2018-07-03 ENCOUNTER — Other Ambulatory Visit: Payer: Self-pay

## 2018-07-03 ENCOUNTER — Telehealth: Payer: Self-pay | Admitting: Family Medicine

## 2018-07-03 VITALS — BP 120/72 | HR 71 | Temp 98.6°F | Resp 16 | Ht 65.28 in | Wt 152.4 lb

## 2018-07-03 DIAGNOSIS — N390 Urinary tract infection, site not specified: Secondary | ICD-10-CM

## 2018-07-03 DIAGNOSIS — K2100 Gastro-esophageal reflux disease with esophagitis, without bleeding: Secondary | ICD-10-CM

## 2018-07-03 DIAGNOSIS — Z87891 Personal history of nicotine dependence: Secondary | ICD-10-CM

## 2018-07-03 DIAGNOSIS — R1312 Dysphagia, oropharyngeal phase: Secondary | ICD-10-CM

## 2018-07-03 DIAGNOSIS — R0609 Other forms of dyspnea: Secondary | ICD-10-CM

## 2018-07-03 DIAGNOSIS — N39498 Other specified urinary incontinence: Secondary | ICD-10-CM | POA: Diagnosis not present

## 2018-07-03 DIAGNOSIS — R55 Syncope and collapse: Secondary | ICD-10-CM

## 2018-07-03 DIAGNOSIS — R42 Dizziness and giddiness: Secondary | ICD-10-CM

## 2018-07-03 DIAGNOSIS — R002 Palpitations: Secondary | ICD-10-CM

## 2018-07-03 DIAGNOSIS — Z1231 Encounter for screening mammogram for malignant neoplasm of breast: Secondary | ICD-10-CM

## 2018-07-03 DIAGNOSIS — N399 Disorder of urinary system, unspecified: Secondary | ICD-10-CM | POA: Diagnosis not present

## 2018-07-03 DIAGNOSIS — E7800 Pure hypercholesterolemia, unspecified: Secondary | ICD-10-CM

## 2018-07-03 DIAGNOSIS — E2839 Other primary ovarian failure: Secondary | ICD-10-CM

## 2018-07-03 DIAGNOSIS — R0600 Dyspnea, unspecified: Secondary | ICD-10-CM

## 2018-07-03 LAB — POCT URINALYSIS DIP (MANUAL ENTRY)
BILIRUBIN UA: NEGATIVE mg/dL
Bilirubin, UA: NEGATIVE
Glucose, UA: NEGATIVE mg/dL
LEUKOCYTES UA: NEGATIVE
Nitrite, UA: NEGATIVE
Protein Ur, POC: NEGATIVE mg/dL
Spec Grav, UA: 1.025 (ref 1.010–1.025)
UROBILINOGEN UA: 0.2 U/dL
pH, UA: 5.5 (ref 5.0–8.0)

## 2018-07-03 MED ORDER — PRAVASTATIN SODIUM 40 MG PO TABS: 40.0000 mg | ORAL_TABLET | Freq: Every day | ORAL | 3 refills | Status: DC

## 2018-07-03 MED ORDER — TOLTERODINE TARTRATE 2 MG PO TABS: 2.0000 mg | ORAL_TABLET | Freq: Every day | ORAL | 1 refills | Status: DC

## 2018-07-03 MED ORDER — PANTOPRAZOLE SODIUM 40 MG PO TBEC: 40.0000 mg | DELAYED_RELEASE_TABLET | Freq: Every day | ORAL | 3 refills | Status: DC

## 2018-07-03 NOTE — Telephone Encounter (Signed)
Copied from [place] [id number]. Topic: Quick Communication - See Telephone Encounter >> Jul 03, 2018 12:19 PM Rutherford Nail, NT wrote: CRM for notification. See Telephone encounter for: 07/03/18. Patient calling and is requesting to get x-rays for left hip and back pain. States that she would like it today. Was seen by Dr Brigitte Pulse earlier this morning and was not able to get the x-rays CB#: [phone]

## 2018-07-03 NOTE — Progress Notes (Signed)
Subjective:    [name], female    DOB: 1951/02/02, 67 y.o.   MRN: [id number] Chief Complaint  Patient presents with  . Urinary Frequency    f/u from 06/29/18.  Hurt her back/hip on yesterday trying to lift senior citizen up who fell on yesterday.  Pt c/o tremor on last night and is unsure why.  Pain level 6/10 , taking ibuprofen for the pain.    . per pt felt faint twice in the last month    per pt hard to breathe at night at first but is feeling this way during the way as well.    HPI A little irritated but maybe not drinking enough water.  During the day, leaks a lot when she coughs, sneeze, bend over.  She is taking the detrol 2mg  qhs - at a higher dose she was getting urinary retention.   Checked at the base of her throat.  Gets sto  Past Medical History:  Diagnosis Date  . Allergy   . Anxiety   . Arthritis   . Cataract    both eyes  . Depression   . GERD (gastroesophageal reflux disease)   . Heart murmur   . Hx of adenomatous polyp of colon 03/16/2018  . Hypercholesteremia   . Left radial head fracture   . Osteoporosis   . Tuberculosis    vaccine in [place] PPD tests positive each time/had treatment for 4 months   Past Surgical History:  Procedure Laterality Date  . CESAREAN SECTION     3 births  . COLONOSCOPY    . POLYPECTOMY     Current Outpatient Medications on File Prior to Visit  Medication Sig Dispense Refill  . aspirin EC 81 MG tablet Take 81 mg by mouth daily.    . AZELASTINE HCL OP Apply 1 drop to eye as needed.    . calcium-vitamin D (OSCAL) 250-125 MG-UNIT per tablet Take 1 tablet by mouth daily. Reported on 11/25/2015    . Cholecalciferol (VITAMIN D3) 1000 units CAPS Take by mouth.    Marland Kitchen ibuprofen (ADVIL,MOTRIN) 800 MG tablet Take 1 tablet (800 mg total) by mouth every 8 (eight) hours as needed. 270 tablet 0  . meclizine (ANTIVERT) 12.5 MG tablet Take 1 tablet (12.5 mg total) by mouth 3 (three) times daily as needed for  dizziness. 30 tablet 6   No current facility-administered medications on file prior to visit.    Allergies  Allergen Reactions  . Tramadol Nausea And Vomiting   Family History  Problem Relation Age of Onset  . Heart disease Mother   . Colon cancer Neg Hx   . Colon polyps Neg Hx   . Esophageal cancer Neg Hx   . Rectal cancer Neg Hx   . Stomach cancer Neg Hx    Social History   Socioeconomic History  . Marital status: Single    Spouse name: Not on file  . Number of children: 3  . Years of education: Not on file  . Highest education level: Not on file  Occupational History  . Not on file  Social Needs  . Financial resource strain: Not on file  . Food insecurity:    Worry: Not on file    Inability: Not on file  . Transportation needs:    Medical: Not on file    Non-medical: Not on file  Tobacco Use  . Smoking status: Former Smoker    Types: Cigarettes    Last  attempt to quit: 03/02/2008    Years since quitting: 10.3  . Smokeless tobacco: Never Used  Substance and Sexual Activity  . Alcohol use: Yes    Alcohol/week: 2.0 standard drinks    Types: 2 Standard drinks or equivalent per week    Comment: occational  . Drug use: No  . Sexual activity: Never  Lifestyle  . Physical activity:    Days per week: Not on file    Minutes per session: Not on file  . Stress: Not on file  Relationships  . Social connections:    Talks on phone: Not on file    Gets together: Not on file    Attends religious service: Not on file    Active member of club or organization: Not on file    Attends meetings of clubs or organizations: Not on file    Relationship status: Not on file  Other Topics Concern  . Not on file  Social History Narrative  . Not on file   Depression screen [facility] 2/9 07/03/2018 06/29/2018 01/07/2018 07/20/2017 05/21/2017  Decreased Interest 1 0 0 0 0  Down, Depressed, Hopeless 1 0 0 0 0  PHQ - 2 Score 2 0 0 0 0  Altered sleeping 0 - - - -  Tired, decreased energy 0 - - -  -  Change in appetite 0 - - - -  Feeling bad or failure about yourself  1 - - - -  Trouble concentrating 0 - - - -  Moving slowly or fidgety/restless 0 - - - -  Suicidal thoughts 0 - - - -  PHQ-9 Score 3 - - - -  Difficult doing work/chores Not difficult at all - - - -     Review of Systems See hpi    Objective:   Physical Exam  Constitutional: She is oriented to person, place, and time. She appears well-developed and well-nourished. No distress.  HENT:  Head: Normocephalic and atraumatic.  Right Ear: External ear normal.  Left Ear: External ear normal.  Eyes: Conjunctivae are normal. No scleral icterus.  Neck: Normal range of motion. Neck supple. No thyromegaly present.  Cardiovascular: Normal rate, regular rhythm, normal heart sounds and intact distal pulses.  Pulmonary/Chest: Effort normal and breath sounds normal. No respiratory distress.  Musculoskeletal: She exhibits no edema.  Lymphadenopathy:    She has no cervical adenopathy.  Neurological: She is alert and oriented to person, place, and time.  Skin: Skin is warm and dry. She is not diaphoretic. No erythema.  Psychiatric: She has a normal mood and affect. Her behavior is normal.      BP 120/72 (BP Location: Left Arm, Patient Position: Sitting, Cuff Size: Normal)   Pulse 71   Temp 98.6 F (37 C) (Oral)   Resp 16   Ht 5' 5.28" (1.658 m)   Wt 152 lb 6.4 oz (69.1 kg)   SpO2 96%   BMI 25.14 kg/m      Assessment & Plan:   1. Urinary system disorder   2. STRESS INCONTINENCE   3. Recurrent UTI   4. HYPERCHOLESTEROLEMIA   5. Gastroesophageal reflux disease with esophagitis   6. Encounter for screening mammogram for breast cancer   7. Estrogen deficiency   8. Postural dizziness with presyncope   9. Palpitations   10. Dyspnea on exertion   11. Near syncope   12. Oropharyngeal dysphagia   13. History of tobacco abuse     Orders Placed This Encounter  Procedures  .  MM Digital Screening    Standing  Status:   Future    Standing Expiration Date:   09/03/2019    Order Specific Question:   Reason for Exam (SYMPTOM  OR DIAGNOSIS REQUIRED)    Answer:   screening    Order Specific Question:   Preferred imaging location?    Answer:   External    Comments:   [facility]  . DG Bone Density    Standing Status:   Future    Standing Expiration Date:   09/03/2019    Order Specific Question:   Reason for Exam (SYMPTOM  OR DIAGNOSIS REQUIRED)    Answer:   estrogen deficiency    Order Specific Question:   Preferred imaging location?    Answer:   External    Comments:   [facility]  . Ambulatory referral to Physical Therapy    Referral Priority:   Routine    Referral Type:   Physical Medicine    Referral Reason:   Specialty Services Required    Requested Specialty:   Physical Therapy    Number of Visits Requested:   1  . Ambulatory referral to Cardiology    Referral Priority:   Routine    Referral Type:   Consultation    Referral Reason:   Specialty Services Required    Requested Specialty:   Cardiology    Number of Visits Requested:   1  . Ambulatory referral to ENT    Referral Priority:   Routine    Referral Type:   Consultation    Referral Reason:   Specialty Services Required    Requested Specialty:   Otolaryngology    Number of Visits Requested:   1  . POCT urinalysis dipstick    Meds ordered this encounter  Medications  . tolterodine (DETROL) 2 MG tablet    Sig: Take 1 tablet (2 mg total) by mouth at bedtime.    Dispense:  90 tablet    Refill:  1  . pravastatin (PRAVACHOL) 40 MG tablet    Sig: Take 1 tablet (40 mg total) by mouth daily.    Dispense:  90 tablet    Refill:  3  . pantoprazole (PROTONIX) 40 MG tablet    Sig: Take 1 tablet (40 mg total) by mouth daily.    Dispense:  90 tablet    Refill:  3    Delman Cheadle, M.D.  Primary Care at [facility] [address] [place], [place] [address] [phone] phone [phone] fax  07/17/18 8:54 AM

## 2018-07-03 NOTE — Patient Instructions (Addendum)
In December make sure you make an appointment to get both your BONE DENSITY (DEXa) BONE SCAN and you MAMMOGRAM done at [facility] - I have sent orders over - you can just call to schedule these appointment for 10/2018 [facility] - [phone]     IF you received an x-ray today, you will receive an invoice from [facility] Radiology. Please contact [facility] Radiology at [phone] with questions or concerns regarding your invoice.   IF you received labwork today, you will receive an invoice from [place]. Please contact [facility] at [phone] with questions or concerns regarding your invoice.   Our billing staff will not be able to assist you with questions regarding bills from these companies.  You will be contacted with the lab results as soon as they are available. The fastest way to get your results is to activate your My Chart account. Instructions are located on the last page of this paperwork. If you have not heard from [place] regarding the results in 2 weeks, please contact this office.     Ejercicios de Kegel Barrister's clerk) Los ejercicios de Kegel ayudan a fortalecer los msculos que sostienen el recto, la vagina, el intestino delgado, la vejiga y [place]. Los ejercicios de Kegel pueden ayudar a lo siguiente:  Mejorar el control de la vejiga y de los intestinos.  Mejorar la respuesta sexual.  Reducir los problemas o las molestias durante el [place]. Los ejercicios de Kegel implican apretar los msculos del suelo plvico, que son los mismos msculos que comprime cuando trata de Scientist, water quality el flujo de la [place]. Los ejercicios se pueden Regulatory affairs officer est sentado, parado o [place], pero lo mejor es variar la posicin. EJERCICIOS DE KEGEL 1. Apriete bien los msculos del suelo plvico. Debera sentir una elevacin firme en el rea del recto. Si es [place], tambin debera sentir una compresin en el rea de la vagina. [place], las nalgas y las piernas  relajadas. 2. Mantenga los msculos apretados durante 10segundos como mximo. 3. Relaje los msculos. Repita este ejercicio 50veces al da o como se lo haya indicado el mdico. Contine haciendo este ejercicio durante al menos 4 a 6semanas y Doctor, general practice tiempo que le haya indicado el mdico. Esta informacin no tiene Marine scientist el consejo del mdico. Asegrese de hacerle al mdico cualquier pregunta que tenga. Document Released: 11/01/2012 Document Revised: 10/05/2015 Document Reviewed: 10/05/2015 Elsevier Interactive Patient Education  [employer].

## 2018-07-05 ENCOUNTER — Other Ambulatory Visit: Payer: Self-pay | Admitting: Urgent Care

## 2018-07-05 ENCOUNTER — Ambulatory Visit (INDEPENDENT_AMBULATORY_CARE_PROVIDER_SITE_OTHER): Payer: Worker's Compensation | Admitting: Urgent Care

## 2018-07-05 ENCOUNTER — Ambulatory Visit (INDEPENDENT_AMBULATORY_CARE_PROVIDER_SITE_OTHER): Payer: Worker's Compensation

## 2018-07-05 ENCOUNTER — Encounter: Payer: Self-pay | Admitting: Urgent Care

## 2018-07-05 VITALS — BP 122/58 | HR 57 | Temp 97.8°F | Resp 16 | Ht 65.0 in | Wt 153.8 lb

## 2018-07-05 DIAGNOSIS — M25552 Pain in left hip: Secondary | ICD-10-CM

## 2018-07-05 DIAGNOSIS — Z026 Encounter for examination for insurance purposes: Secondary | ICD-10-CM

## 2018-07-05 DIAGNOSIS — S29012A Strain of muscle and tendon of back wall of thorax, initial encounter: Secondary | ICD-10-CM

## 2018-07-05 DIAGNOSIS — M545 Low back pain, unspecified: Secondary | ICD-10-CM

## 2018-07-05 DIAGNOSIS — M546 Pain in thoracic spine: Secondary | ICD-10-CM

## 2018-07-05 DIAGNOSIS — M25551 Pain in right hip: Secondary | ICD-10-CM

## 2018-07-05 DIAGNOSIS — S39012A Strain of muscle, fascia and tendon of lower back, initial encounter: Secondary | ICD-10-CM | POA: Diagnosis not present

## 2018-07-05 MED ORDER — PREDNISONE 20 MG PO TABS: 20.0000 mg | ORAL_TABLET | Freq: Every day | ORAL | 0 refills | Status: DC

## 2018-07-05 MED ORDER — PREDNISONE 10 MG PO TABS: 10.0000 mg | ORAL_TABLET | Freq: Every day | ORAL | 0 refills | Status: DC

## 2018-07-05 MED ORDER — CYCLOBENZAPRINE HCL 5 MG PO TABS: 5.0000 mg | ORAL_TABLET | Freq: Every evening | ORAL | 1 refills | Status: DC | PRN

## 2018-07-05 NOTE — Patient Instructions (Addendum)
Distensin lumbar con rehabilitacin (Low Back Strain With Rehab) Una distensin es un estiramiento o un desgarro en un msculo o los fuertes cordones de tejido que adhieren el msculo al hueso (tendones). Las distensiones en la parte inferior de la espalda (columna lumbar) son Ardelia Mems causa frecuente de dolor lumbar. Una distensin ocurre cuando los msculos o tendones se desgarran o se estiran ms all de su lmite. Los msculos se pueden inflamar y, por lo tanto, Actor dolor y Writer repentina del msculo (espasmos). Una distensin puede ocurrir de repente debido a una lesin (traumatismo) o se Psychologist, occupational gradualmente debido al [employer]. Hay tres tipos de distensiones:  El grado1 es una distensin leve que se caracteriza por un Scientist, clinical (histocompatibility and immunogenetics) de las fibras o tendones musculares. Esto puede provocar un poco de dolor, pero no hay prdida de fuerza muscular.  El grado2 es una distensin moderada producida por un desgarro parcial de las fibras o tendones musculares. Esto provoca un dolor ms intenso y cierta prdida de fuerza muscular.  El grado3 es una distensin grave e implica la ruptura completa del msculo o del tendn. Esto causa un dolor intenso y la prdida completa o casi completa de la fuerza muscular. CAUSAS Esta afeccin puede ser causada por lo siguiente:  Traumatismo, debido, por ejemplo, a una cada o a un golpe en el cuerpo.  Torsin o hiperdistensin de la espalda. Esto puede ser el resultado de realizar actividades que requieren mucha energa, como levantar objetos pesados. [place] siguientes factores pueden aumentar el riesgo de sufrir esta afeccin:  Psychologist, prison and probation services deportes de contacto.  Participar en deportes o actividades que sobrecargan la espalda e implican mucha flexin y torsin, por ejemplo: ? Levantar pesas u objetos pesados. ? Gimnasia. ? Ftbol. ? [place] artstico. ? Snowboard.  Tener exceso de [place] u obesidad.  Tener poca  fuerza y flexibilidad. SNTOMAS Los sntomas de esta afeccin pueden incluir los siguientes:  Dolor agudo o sordo en la parte inferior de la espalda que no desaparece. El dolor se puede extender [employer].  Rigidez.  Amplitud de movimientos limitada.  Incapacidad para pararse derecho debido a la rigidez o al [employer].  Espasmos musculares. DIAGNSTICO Esta afeccin se puede diagnosticar en funcin de lo siguiente:  Sus sntomas.  Sus antecedentes mdicos.  Un examen fsico. ? El mdico puede presionar sobre ciertas zonas de la espalda para determinar el origen de su dolor. ? Le puede pedir que se incline [employer], [place] atrs y de un lado al otro para Chief of Staff la intensidad del dolor y la amplitud de [place].  Pruebas de diagnstico por imgenes, como: ? Radiografas. ? Resonancia magntica (RM). TRATAMIENTO El tratamiento de esta afeccin puede incluir lo siguiente:  Presenter, broadcasting y fro en la zona afectada.  Tomar medicamentos para Best boy y Media planner los msculos (relajantes musculares).  Tomar antiinflamatorios no esteroides Dayna Ramus) para ayudar a Forensic psychologist y las molestias.  Realizar fisioterapia. Cuando los sntomas mejoran, es importante volver a su rutina habitual tan pronto como sea posible para reducir Conservation officer, historic buildings, y Product/process development scientist la rigidez y la prdida de fuerza muscular. En general, los sntomas deberan mejorar en 6semanas de tratamiento. Sin embargo, el tiempo de recuperacin vara. INSTRUCCIONES PARA EL CUIDADO EN EL HOGAR Control del dolor, de la rigidez y de la hinchazn  Si se lo indic el mdico, aplique hielo en la zona afectada durante las primeras 24horas despus de la lesin. ? Ponga el hielo en una bolsa plstica. ?  Coloque una toalla entre la piel y la bolsa de hielo. ? Coloque el hielo durante 42minutos, 2 a 3veces por da.  Si se lo indican, aplique calor en la zona afectada tan frecuentemente como se lo haya indicado el  mdico. Use la fuente de calor que el mdico le recomiende, como una compresa de calor hmedo o una almohadilla trmica. ? Coloque una [employer] piel y la fuente de Freight forwarder. ? Aplique el calor durante 20 a 15minutos. ? Retire la fuente de calor si la piel se le pone de color rojo brillante. Esto es muy importante si no puede sentir el dolor, el calor o el fro. Puede correr un riesgo mayor de sufrir quemaduras. Actividad  Descanse y retome sus actividades normales como se lo haya indicado el mdico. Pregntele al mdico qu actividades son seguras para usted.  Evite las actividades que demandan mucho esfuerzo (que son extenuantes) durante el tiempo que le haya indicado el mdico.  Haga ejercicios como se lo haya indicado el mdico. Instrucciones generales  [employer] medicamentos de venta libre y los recetados solamente como se lo haya indicado el mdico.  Si tiene alguna pregunta o inquietud sobre la seguridad mientras toma analgsicos, hable con el mdico.  No conduzca ni opere maquinaria pesada hasta saber cmo lo afectan los analgsicos.  No consuma ningn producto que contenga tabaco, lo que incluye cigarrillos, tabaco de Higher education careers adviser y Psychologist, sport and exercise. El tabaco puede retrasar el proceso de curacin. Si necesita ayuda para dejar de fumar, consulte al mdico.  Concurra a todas las visitas de control como se lo haya indicado el mdico. Esto es importante. PREVENCIN  Precaliente y elongue adecuadamente antes de la [place].  Reljese y elongue despus de realizar [place].  Dele a su cuerpo tiempo para [employer] perodos de [place].  Evite lo siguiente: ? Estar fsicamente inactivo durante perodos prolongados. ? Hacer ejercicio o practicar deportes cuando est cansado o dolorido.  Practique deportes y levante objetos pesados de la forma correcta.  Adopte una buena postura mientras est sentado y de pie.  Mantenga un peso saludable.  Duerma en un  colchn de firmeza media para apoyar la columna.  Asegrese de Risk manager un equipo apto para usted, incluidos zapatos que [employer].  Tome medidas de seguridad y sea responsable al hacer [place], para evitar las cadas.  Haga por lo menos 124minutos de ejercicios de intensidad moderada cada semana, como caminar a paso ligero o hacer gimnasia acutica. Pruebe alguna forma de ejercicio que le quite tensin de la espalda, como nadar o Risk manager la bicicleta fija.  Mantenga un buen estado fsico, esto incluye lo siguiente: ? La fuerza. ? La flexibilidad. ? La capacidad cardiovascular. ? La resistencia.  SOLICITE ATENCIN MDICA SI:  El dolor de espalda no mejora despus de 6semanas de [place].  Los sntomas empeoran.  SOLICITE ATENCIN MDICA DE INMEDIATO SI:  El dolor de espalda es muy intenso.  Nota que no puede pararse o caminar.  Siente dolor en las piernas.  Siente debilidad en las nalgas o en las piernas.  Tiene problemas para Aeronautical engineer miccin o los movimientos intestinales.  Esta informacin no tiene Marine scientist el consejo del mdico. Asegrese de hacerle al mdico cualquier pregunta que tenga. Document Released: 09/01/2006 Document Revised: 04/01/2015 Document Reviewed: 08/27/2015 Elsevier Interactive Patient Education  2017 [employer].     IF you received an x-ray today, you will receive an invoice from [facility] Radiology. Please contact [facility] Radiology  at [phone] with questions or concerns regarding your invoice.   IF you received labwork today, you will receive an invoice from [place]. Please contact [facility] at [phone] with questions or concerns regarding your invoice.   Our billing staff will not be able to assist you with questions regarding bills from these companies.  You will be contacted with the lab results as soon as they are available. The fastest way to get your results is to activate your My Chart account.  Instructions are located on the last page of this paperwork. If you have not heard from [place] regarding the results in 2 weeks, please contact this office.

## 2018-07-05 NOTE — Progress Notes (Signed)
MRN: [id number] DOB: [birth year]  Subjective:   Lisa Costa is a 67 y.o. female presenting for worker's comp visit.   Reports that she suffered a back injury while at work on 07/02/2018. 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 Aloni that her patient fell and as she was falling, Recia caught the patient and helped to break the fall. She felt multiple pops in her upper back. Has had upper back pain since then, low back pain, upper posterior hip pain. Feels unsteady on her feet, has mild right knee pain. Denies numbness or tingling, radicular pain, incontinence.  Patient has tried hydrocodone once with some relief. She was advised not to use NSAIDs by her PCP.   Verlean's medications list, allergies, past medical history, past surgical history, social history, family history were reviewed and excluded from this note due to being a worker's comp case.   Objective:   Vitals: BP (!) 122/58   Pulse (!) 57   Temp 97.8 F (36.6 C) (Oral)   Resp 16   Ht 5\' 5"  (1.651 m)   Wt 153 lb 12.8 oz (69.8 kg)   SpO2 97%   BMI 25.59 kg/m   Physical Exam  Constitutional: She is oriented to person, place, and time. She appears well-developed and well-nourished.  Cardiovascular: Normal rate.  Pulmonary/Chest: Effort normal.  Musculoskeletal:       Cervical back: She exhibits tenderness (by report only, not elicited on exam/palpation). She exhibits normal range of motion, no bony tenderness, no swelling, no edema, no deformity and no spasm.       Thoracic back: She exhibits normal range of motion, no tenderness, no bony tenderness, no swelling, no edema, no deformity, no laceration and no spasm.       Lumbar back: She exhibits decreased range of motion (flexion, extension), tenderness (over area depicted), bony tenderness (spinous processes of lumbar region) and spasm. She exhibits no swelling, no edema, no deformity and no laceration.       Back:  Neurological:  She is alert and oriented to person, place, and time.   Dg Lumbar Spine Complete  Result Date: 07/05/2018 CLINICAL DATA:  Low back pain EXAM: LUMBAR SPINE - COMPLETE 4+ VIEW COMPARISON:  Thoracic spine series of Apr 06, 2016 FINDINGS: The twelfth ribs are small. The lumbar vertebral bodies are preserved in height. The pedicles and transverse processes are intact. There is mild disc space narrowing at all lumbar levels greatest at L4-5. There is no spondylolisthesis. There is facet joint hypertrophy at L4-5 and at L5-S1. There is mild wedge compression of the body of T11 which is not new. IMPRESSION: Mild-to-moderate multilevel degenerative disc and facet joint change. No compression fracture or spondylolisthesis of the lumbar spine. Mild chronic wedge compression of T11 which is stable. Electronically Signed   By: David  Martinique M.D.   On: 07/05/2018 11:10   Dg Pelvis 1-2 Views  Result Date: 07/05/2018 CLINICAL DATA:  Left hip pain EXAM: PELVIS - 1-2 VIEW COMPARISON:  None in PACs FINDINGS: The bones are subjectively mildly osteopenic. There is no lytic or blastic pelvic lesion. The hip joint spaces are reasonably well maintained. The femoral heads, necks, and intertrochanteric regions are normal where visualized. The colonic stool burden is moderately increased. IMPRESSION: No acute bony abnormality of the pelvis. Limited visualization of both hips reveals no significant joint space loss nor acute bony abnormality. No frogleg lateral views are available on this single  AP pelvis study. Electronically Signed   By: David  Martinique M.D.   On: 07/05/2018 11:08     Assessment and Plan :   Acute midline low back pain without sciatica - Plan: CANCELED: DG Thoracic Spine 2 View  Bilateral hip pain - Plan: CANCELED: DG HIP UNILAT W OR W/O PELVIS 2-3 VIEWS LEFT  Back strain, initial encounter  Strain of mid-back, initial encounter  Encounter related to worker's compensation claim  Will manage patient with  steroid course and muscle relaxant, work restrictions.  Consider referral to orthopedics or physical therapy if symptoms persist.  Patient is to follow-up in 2 weeks. Counseled patient on potential for adverse effects with medications prescribed today, patient verbalized understanding.   Jaynee Eagles, PA-C [facility] [phone] 07/05/2018 10:44 AM

## 2018-07-07 ENCOUNTER — Encounter: Payer: Self-pay | Admitting: Radiology

## 2018-07-10 ENCOUNTER — Telehealth: Payer: Self-pay | Admitting: Family Medicine

## 2018-07-10 NOTE — Telephone Encounter (Signed)
Called pt. To reschedule appt on 11/06/18 . Rescheduled.

## 2018-07-14 ENCOUNTER — Other Ambulatory Visit: Payer: Self-pay

## 2018-07-14 ENCOUNTER — Ambulatory Visit: Payer: BLUE CROSS/BLUE SHIELD | Attending: Family Medicine | Admitting: Physical Therapy

## 2018-07-14 ENCOUNTER — Encounter: Payer: Self-pay | Admitting: Physical Therapy

## 2018-07-14 DIAGNOSIS — R278 Other lack of coordination: Secondary | ICD-10-CM | POA: Diagnosis present

## 2018-07-14 DIAGNOSIS — M6281 Muscle weakness (generalized): Secondary | ICD-10-CM | POA: Diagnosis present

## 2018-07-14 NOTE — Patient Instructions (Addendum)
Slow Contraction: Gravity Eliminated (Hook-Lying)    Lie with hips and knees bent. Slowly squeeze pelvic floor for _5__ seconds. Rest for _5__ seconds. Repeat _5__ times. Do _2__ times a day.   Copyright  VHI. All rights reserved.  Slow Contraction: Gravity Resisted (Sitting)    Sitting, slowly squeeze pelvic floor for _5__ seconds. Rest for __5_ seconds. Repeat _5__ times. Do __5_ times a day.  Copyright  VHI. All rights reserved.  [facility] [address], [place], [place] [address] Phone # [phone] Fax [phone]

## 2018-07-14 NOTE — Therapy (Signed)
[facility] [address], [place], [place], [address] Phone: [phone]   Fax:  [phone]  Physical Therapy Evaluation  Patient Details  Name: Lisa Costa MRN: [id number] Date of Birth: 04-19-1951 Referring Provider: dr. Delman Cheadle   Encounter Date: 07/14/2018  PT End of Session - 07/14/18 1144    Visit Number  1    Date for PT Re-Evaluation  10/06/18    Authorization Type  [employer]    PT Start Time  1100    PT Stop Time  1135    PT Time Calculation (min)  35 min    Activity Tolerance  Patient tolerated treatment well    Behavior During Therapy  [facility] for tasks assessed/performed       Past Medical History:  Diagnosis Date  . Allergy   . Anxiety   . Arthritis   . Cataract    both eyes  . Depression   . GERD (gastroesophageal reflux disease)   . Heart murmur   . Hx of adenomatous polyp of colon 03/16/2018  . Hypercholesteremia   . Left radial head fracture   . Osteoporosis   . Tuberculosis    vaccine in [place] PPD tests positive each time/had treatment for 4 months    Past Surgical History:  Procedure Laterality Date  . CESAREAN SECTION     3 births  . COLONOSCOPY    . POLYPECTOMY      There were no vitals filed for this visit.   Subjective Assessment - 07/14/18 1106    Subjective  Patient reports urinary leakage 4 year ago. Patient wears 2 pads per day. None during night. I have hurt my back at work.     Patient Stated Goals  stop leakage, stop using pads    Currently in Pain?  Yes    Pain Score  7     Pain Location  Back    Pain Orientation  Mid;Lower    Pain Descriptors / Indicators  Sharp;Shooting;Stabbing   pinching   Pain Type  Acute pain    Pain Onset  More than a month ago    Pain Frequency  Intermittent    Aggravating Factors   sit to stand, movement,     Multiple Pain Sites  No         [facility] PT Assessment - 07/14/18 0001      Assessment   Medical Diagnosis  N39.498  other urinary incontinence    Referring Provider  dr. Delman Cheadle    Onset Date/Surgical Date  07/14/14    Prior Therapy  none      Precautions   Precautions  Other (comment)    Precaution Comments  osteoporosis      Restrictions   Weight Bearing Restrictions  No      Balance Screen   Has the patient fallen in the past 6 months  No    Has the patient had a decrease in activity level because of a fear of falling?   No    Is the patient reluctant to leave their home because of a fear of falling?   No      Home Film/video editor residence      Prior Function   Level of Independence  Independent    Vocation  Full time employment    Vocation Requirements  caregiver      Cognition   Overall Cognitive Status  Within Functional Limits  for tasks assessed      Posture/Postural Control   Posture/Postural Control  No significant limitations      ROM / Strength   AROM / PROM / Strength  AROM;PROM;Strength      Strength   Right Hip Extension  4/5    Right Hip ABduction  4/5    Left Hip Extension  4/5    Left Hip ABduction  4/5                Objective measurements completed on examination: See above findings.    Pelvic Floor Special Questions - 07/14/18 0001    Prior Pregnancies  Yes    Number of Pregnancies  4    Number of C-Sections  3    Currently Sexually Active  No    Urinary Leakage  Yes    Pad use  2    Activities that cause leaking  With strong urge;Coughing;Sneezing;Laughing   running water   Urinary urgency  No    Skin Integrity  Intact   dry   External Palpation  clitoral hood is adhered to the clitorus    Prolapse  None    Pelvic Floor Internal Exam  Patient confirms identification adn approves PT to assess the pelvic floor and treat    Exam Type  Vaginal    Palpation  tenderness located on the right obturator internist, and sphincter muscle    Strength  fair squeeze, definite lift   after soft tissue work to stimulate the  sphincter muscle              PT Education - 07/14/18 1143    Education Details  information on pelvic floor exercise in sitting and hookly    Person(s) Educated  Patient    Methods  Explanation;Demonstration;Handout;Verbal cues    Comprehension  Verbalized understanding;Returned demonstration       PT Short Term Goals - 07/14/18 1148      PT SHORT TERM GOAL #1   Title  independent with initial HEP    Time  4    Period  Weeks    Status  New    Target Date  08/11/18      PT SHORT TERM GOAL #2   Title  urinary leakage decreased >/= 25% due to improved strength and coordination    Time  4    Period  Weeks    Status  New    Target Date  08/11/18        PT Long Term Goals - 07/14/18 1145      PT LONG TERM GOAL #1   Title  independent with initial HEP    Time  12    Period  Weeks    Status  New    Target Date  10/06/18      PT LONG TERM GOAL #2   Title  wears no pads due to urinary leakage decreased due to improved strength and coordination    Time  12    Period  Weeks    Status  New    Target Date  10/06/18      PT LONG TERM GOAL #3   Title  pelvic floor strength >/= 4/5 decreasing urinary leakage with sneezing, coughing, laughing by 80%    Time  12    Period  Weeks    Status  New    Target Date  10/06/18      PT LONG TERM GOAL #4   Title  ability to contract the pelvic floor without holding her breath with pelvic floor contraction    Time  12    Period  Weeks    Status  New    Target Date  10/06/18             Plan - 07/14/18 1149    Clinical Impression Statement  Patient is a 67 year old female with urinary incontinence.  Patient wears 2 pads per day.  She leaks urine with coughing, laughing, sneezing, and with running water.  Pelvic floor strength is 3/5 after soft tissue is done to the introitus but prior was 2/5.  Patient was able to form a circular contraction after soft tissue work. Patient has weakness in the hips.  Patient reports she  has hurt her back during work and is pain level 7/10 intermittently.  Patient was instructed on how to move in bed and getting up from a chir to protect her back while performing the evaluation. Patient will benefit from skilled therapy to improve pelvic floor strength and coordination to reduce urinary leakage.     History and Personal Factors relevant to plan of care:  osteoporosis    Clinical Presentation  Stable    Clinical Presentation due to:  stable condition    Clinical Decision Making  Low    Rehab Potential  Excellent    Clinical Impairments Affecting Rehab Potential  none    PT Frequency  1x / week    PT Duration  12 weeks    PT Treatment/Interventions  Biofeedback;Therapeutic activities;Therapeutic exercise;Patient/family education;Neuromuscular re-education;Manual techniques    PT Next Visit Plan  pelvic floor contraction, bladder irritants, abdominal contraction, soft tissue    Consulted and Agree with Plan of Care  Patient       Patient will benefit from skilled therapeutic intervention in order to improve the following deficits and impairments:  Pain, Decreased activity tolerance, Decreased endurance, Decreased strength  Visit Diagnosis: Muscle weakness (generalized) - Plan: PT plan of care cert/re-cert  Other lack of coordination - Plan: PT plan of care cert/re-cert     Problem List Patient Active Problem List   Diagnosis Date Noted  . Recurrent UTI 05/03/2017  . Hx of adenomatous polyp of colon 01/10/2012  . ALLERGIC RHINITIS 08/27/2010  . STRESS INCONTINENCE 12/22/2009  . HYPERCHOLESTEROLEMIA 03/24/2009  . LUNG NODULE 01/03/2009  . TB SKIN TEST, POSITIVE 01/02/2009  . MICROSCOPIC HEMATURIA 12/31/2008  . VAGINAL MASS 12/05/2008  . ANXIETY DEPRESSION 07/22/2008  . Disorder of bone and cartilage 05/07/2008  . DECREASED HEARING 05/06/2008  . DENTAL CARIES 10/03/2007  . GERD 10/03/2007    Earlie Counts, PT 07/14/18 11:58 AM   [facility] [address], [place], [place], [address] Phone: [phone]   Fax:  [phone]  Name: Lisa Costa MRN: [id number] Date of Birth: 1951-01-08

## 2018-07-17 ENCOUNTER — Encounter: Payer: Self-pay | Admitting: Family Medicine

## 2018-07-17 ENCOUNTER — Other Ambulatory Visit: Payer: Self-pay

## 2018-07-17 ENCOUNTER — Ambulatory Visit (INDEPENDENT_AMBULATORY_CARE_PROVIDER_SITE_OTHER): Payer: Worker's Compensation | Admitting: Family Medicine

## 2018-07-17 VITALS — BP 108/68 | HR 88 | Temp 98.5°F | Resp 16 | Ht 65.75 in | Wt 154.0 lb

## 2018-07-17 DIAGNOSIS — S39012D Strain of muscle, fascia and tendon of lower back, subsequent encounter: Secondary | ICD-10-CM

## 2018-07-17 DIAGNOSIS — M5442 Lumbago with sciatica, left side: Secondary | ICD-10-CM | POA: Diagnosis not present

## 2018-07-17 MED ORDER — METHOCARBAMOL 750 MG PO TABS: 375.0000 mg | ORAL_TABLET | Freq: Four times a day (QID) | ORAL | 0 refills | Status: DC | PRN

## 2018-07-17 NOTE — Patient Instructions (Addendum)
If you have lab work done today you will be contacted with your lab results within the next 2 weeks.  If you have not heard from [place] then please contact us. The fastest way to get your results is to register for My Chart.   IF you received an x-ray today, you will receive an invoice from [facility]. Please contact [facility] at [phone] with questions or concerns regarding your invoice.   IF you received labwork today, you will receive an invoice from [facility]. Please contact [facility] at [phone] with questions or concerns regarding your invoice.   Our billing staff will not be able to assist you with questions regarding bills from these companies.  You will be contacted with the lab results as soon as they are available. The fastest way to get your results is to activate your My Chart account. Instructions are located on the last page of this paperwork. If you have not heard from [place] regarding the results in 2 weeks, please contact this office.     Distensin lumbar con rehabilitacin (Low Back Strain With Rehab) Una distensin es un estiramiento o un desgarro en un msculo o los fuertes cordones de tejido que adhieren el msculo al hueso (tendones). Las distensiones en la parte inferior de la espalda (columna lumbar) son Ardelia Mems causa frecuente de dolor lumbar. Una distensin ocurre cuando los msculos o tendones se desgarran o se estiran ms all de su lmite. Los msculos se pueden inflamar y, por lo tanto, Actor dolor y Writer repentina del msculo (espasmos). Una distensin puede ocurrir de repente debido a una lesin (traumatismo) o se Psychologist, occupational gradualmente debido al [employer]. Hay tres tipos de distensiones:  El grado1 es una distensin leve que se caracteriza por un Scientist, clinical (histocompatibility and immunogenetics) de las fibras o tendones musculares. Esto puede provocar un poco de dolor, pero no hay prdida de fuerza muscular.  El grado2 es una distensin moderada  producida por un desgarro parcial de las fibras o tendones musculares. Esto provoca un dolor ms intenso y cierta prdida de fuerza muscular.  El grado3 es una distensin grave e implica la ruptura completa del msculo o del tendn. Esto causa un dolor intenso y la prdida completa o casi completa de la fuerza muscular. CAUSAS Esta afeccin puede ser causada por lo siguiente:  Traumatismo, debido, por ejemplo, a una cada o a un golpe en el cuerpo.  Torsin o hiperdistensin de la espalda. Esto puede ser el resultado de realizar actividades que requieren mucha energa, como levantar objetos pesados. [place] siguientes factores pueden aumentar el riesgo de sufrir esta afeccin:  Psychologist, prison and probation services deportes de contacto.  Participar en deportes o actividades que sobrecargan la espalda e implican mucha flexin y torsin, por ejemplo: ? Levantar pesas u objetos pesados. ? Gimnasia. ? Ftbol. ? [place] artstico. ? Snowboard.  Tener exceso de [place] u obesidad.  Tener poca fuerza y flexibilidad. SNTOMAS Los sntomas de esta afeccin pueden incluir los siguientes:  Dolor agudo o sordo en la parte inferior de la espalda que no desaparece. El dolor se puede extender [employer].  Rigidez.  Amplitud de movimientos limitada.  Incapacidad para pararse derecho debido a la rigidez o al [employer].  Espasmos musculares. DIAGNSTICO Esta afeccin se puede diagnosticar en funcin de lo siguiente:  Sus sntomas.  Sus antecedentes mdicos.  Un examen fsico. ? El mdico puede presionar sobre ciertas zonas de la espalda para determinar el origen de su dolor. ? Marin Comment  puede pedir que se incline [employer], hacia atrs y de un lado al otro para Chief of Staff la intensidad del dolor y la amplitud de [place].  Pruebas de diagnstico por imgenes, como: ? Radiografas. ? Resonancia magntica (RM). TRATAMIENTO El tratamiento de esta afeccin puede incluir lo siguiente:  Presenter, broadcasting y  fro en la zona afectada.  Tomar medicamentos para Best boy y Media planner los msculos (relajantes musculares).  Tomar antiinflamatorios no esteroides Dayna Ramus) para ayudar a Forensic psychologist y las molestias.  Realizar fisioterapia. Cuando los sntomas mejoran, es importante volver a su rutina habitual tan pronto como sea posible para reducir Conservation officer, historic buildings, y Product/process development scientist la rigidez y la prdida de fuerza muscular. En general, los sntomas deberan mejorar en 6semanas de tratamiento. Sin embargo, el tiempo de recuperacin vara. INSTRUCCIONES PARA EL CUIDADO EN EL HOGAR Control del dolor, de la rigidez y de la hinchazn  Si se lo indic el mdico, aplique hielo en la zona afectada durante las primeras 24horas despus de la lesin. ? Ponga el hielo en una bolsa plstica. ? Coloque una [employer] piel y la bolsa de hielo. ? Coloque el hielo durante 2minutos, 2 a 3veces por da.  Si se lo indican, aplique calor en la zona afectada tan frecuentemente como se lo haya indicado el mdico. Use la fuente de calor que el mdico le recomiende, como una compresa de calor hmedo o una almohadilla trmica. ? Coloque una [employer] piel y la fuente de Freight forwarder. ? Aplique el calor durante 20 a 79minutos. ? Retire la fuente de calor si la piel se le pone de color rojo brillante. Esto es muy importante si no puede sentir el dolor, el calor o el fro. Puede correr un riesgo mayor de sufrir quemaduras. Actividad  Descanse y retome sus actividades normales como se lo haya indicado el mdico. Pregntele al mdico qu actividades son seguras para usted.  Evite las actividades que demandan mucho esfuerzo (que son extenuantes) durante el tiempo que le haya indicado el mdico.  Haga ejercicios como se lo haya indicado el mdico. Instrucciones generales  [employer] Corporation medicamentos de venta libre y los recetados solamente como se lo haya indicado el mdico.  Si tiene alguna pregunta o inquietud sobre la  seguridad mientras toma analgsicos, hable con el mdico.  No conduzca ni opere maquinaria pesada hasta saber cmo lo afectan los analgsicos.  No consuma ningn producto que contenga tabaco, lo que incluye cigarrillos, tabaco de Higher education careers adviser y Psychologist, sport and exercise. El tabaco puede retrasar el proceso de curacin. Si necesita ayuda para dejar de fumar, consulte al mdico.  Concurra a todas las visitas de control como se lo haya indicado el mdico. Esto es importante. PREVENCIN  Precaliente y elongue adecuadamente antes de la [place].  Reljese y elongue despus de realizar [place].  Dele a su cuerpo tiempo para [employer] Inc perodos de [place].  Evite lo siguiente: ? Estar fsicamente inactivo durante perodos prolongados. ? Hacer ejercicio o practicar deportes cuando est cansado o dolorido.  Practique deportes y levante objetos pesados de la forma correcta.  Adopte una buena postura mientras est sentado y de pie.  Mantenga un peso saludable.  Duerma en un colchn de firmeza media para apoyar la columna.  Asegrese de Risk manager un equipo apto para usted, incluidos zapatos que Lennar Corporation.  Tome medidas de seguridad y sea responsable al hacer [place] Parkin, para evitar las cadas.  Haga por lo menos 158minutos de ejercicios de intensidad moderada cada  semana, como caminar a paso ligero o hacer gimnasia acutica. Pruebe alguna forma de ejercicio que le quite tensin de la espalda, como nadar o Risk manager la bicicleta fija.  Mantenga un buen estado fsico, esto incluye lo siguiente: ? La fuerza. ? La flexibilidad. ? La capacidad cardiovascular. ? La resistencia.  SOLICITE ATENCIN MDICA SI:  El dolor de espalda no mejora despus de 6semanas de [place].  Los sntomas empeoran.  SOLICITE ATENCIN MDICA DE INMEDIATO SI:  El dolor de espalda es muy intenso.  Nota que no puede pararse o caminar.  Siente dolor en las piernas.  Siente debilidad en las  nalgas o en las piernas.  Tiene problemas para Aeronautical engineer miccin o los movimientos intestinales.  Esta informacin no tiene Marine scientist el consejo del mdico. Asegrese de hacerle al mdico cualquier pregunta que tenga. Document Released: 09/01/2006 Document Revised: 04/01/2015 Document Reviewed: 08/27/2015 Elsevier Interactive Patient Education  2017 [place] Back Strain With Rehab Low Back Strain Rehab Consulte al mdico qu ejercicios son seguros para usted. Haga los ejercicios exactamente como se lo haya indicado el mdico y gradelos como se lo hayan indicado. Es normal sentir un leve estiramiento, tirn, rigidez o molestia cuando haga estos ejercicios, pero debe detenerse de inmediato si siento un dolor repentino o si el dolor empeora. No comience a hacer estos ejercicios hasta que se lo indique el mdico. EJERCICIOS DE Fiserv Y AMPLITUD DE MOVIMIENTOS Estos ejercicios calientan los msculos y las articulaciones, y [place] y la flexibilidad de la espalda. Estos ejercicios tambin ayudan a Best boy, el adormecimiento y el hormigueo. Ejercicio A: Rodilla al pecho 1. Acustese boca arriba en una superficie firme con las piernas extendidas. 2. Flexione una rodilla. Tome la rodilla con las manos y llvela hacia el pecho hasta que sienta un estiramiento suave en la parte inferior de la espalda y las nalgas ? Mantenga la otra pierna lo ms extendida posible. ? Mantenga la otra pierna lo ms extendida posible. 3. Mantenga esta posicin durante __________ segundos. 4. Vuelva lentamente a la posicin inicial. 5. Repita el ejercicio con la otra pierna.  Repita __________ veces. Realice este ejercicio __________ veces al da. Ejercicio B: Extensin ArvinMeritor codos, en decbito prono 1. Acustese boca abajo sobre una superficie firme. 2. Apyese sobre los codos. 3. Con los brazos, aydese a Counselling psychologist sentir un leve estiramiento en el  abdomen y la parte inferior de la espalda. ? Training and development officer algo de [employer] codos. Si no se siente cmodo, intente colocando almohadas debajo del pecho. ? Debe dejar la cadera inmvil sobre la superficie en la que est apoyado. Mantenga la cadera y los msculos de la espalda relajados. 4. Mantenga esta posicin durante __________ segundos. 5. Afloje lentamente la parte superior del cuerpo y vuelva a la posicin inicial.  Repita __________ veces. Realice este ejercicio __________ veces al da. STRENGTHENING EXERCISES Estos ejercicios fortalecen la espalda y le otorgan resistencia. La resistencia es la capacidad de usar los msculos durante un tiempo prolongado, incluso despus de que se cansen. Ejercicio C: Inclinacin de la pelvis 1. Acustese boca arriba sobre una superficie firme. [place] y [place]. 2. Tensione los msculos abdominales. Eleve la pelvis hacia el techo y aplane la parte inferior de la espalda contra el suelo. ? Para realizar este ejercicio, puede colocar una toalla pequea debajo de la parte inferior de la espalda y presionar la espalda contra la  toalla. 3. Mantenga esta posicin durante __________ segundos. 4. Relaje totalmente los msculos antes de repetir el ejercicio.  Repita __________ veces. Realice este ejercicio __________ veces al da. Ejercicio D: Elevaciones alternadas de pierna y brazo 1. [place] manos y las rodillas sobre una superficie firme. Si se colocar sobre una superficie muy dura, puede usar un elemento acolchado para apoyar las rodillas, como una alfombrilla para ejercicios. 2. [place] los brazos y las piernas. Las manos deben estar debajo de los hombros y las rodillas debajo de la cadera. 3. Eleve la pierna izquierda hacia atrs. Al mismo tiempo, eleve el brazo derecho y Engineer, petroleum frente a usted. ? No eleve la pierna por encima de la cadera. ? No eleve el brazo por encima del hombro. ? Mantenga los  msculos del abdomen y de la espalda contrados. ? Mantenga la cadera General Motors el suelo. ? No arquee la espalda. ? Mantenga el equilibrio con cuidado y no contenga la respiracin. 4. Mantenga esta posicin durante __________ segundos. 5. Lentamente regrese a la posicin inicial y repita el ejercicio con la pierna derecha y el brazo izquierdo.  Repita __________ veces. Realice este ejercicio __________ veces al da. Ejercicio J: Bajar una pierna con rodillas flexionadas 1. Acustese boca arriba sobre una superficie firme. 2. Apriete los msculos abdominales y Lubrizol Corporation del piso, uno a la vez, de modo que las rodillas y la cadera estn flexionadas en forma de "L" (a aproximadamente 90 grados). ? Las rodillas deben estar por encima de la cadera y las pantorrillas deben quedar paralelas al piso. 3. Con los msculos abdominales tensos y la rodilla flexionada, baje lentamente una pierna de modo que los dedos del pie toquen el suelo. 4. Levante la pierna para volver a la posicin inicial. ? No contenga la respiracin. ? No deje que la espalda se arquee. Mantenga la espalda plana contra el suelo. 5. Repita el ejercicio con la otra pierna.  Repita __________ veces. Realice este ejercicio __________ veces al da. Theotis Barrio Y MECNICA CORPORAL La Therapist, nutritional se refiere a los movimientos y a las posiciones del cuerpo mientras realiza las actividades diarias. La postura es una parte de la Therapist, nutritional. La buena postura y la Engineer, agricultural corporal saludable pueden ayudar a Theatre stage manager estrs en las articulaciones y los tejidos del cuerpo. La buena postura significa que la columna mantiene su posicin natural de curvatura en forma de S (la columna est en una posicin neutral), los hombros Lucianne Lei un poco hacia atrs y la cabeza no se inclina hacia adelante. A continuacin, se incluyen pautas generales para mejorar la postura y Quarry manager en las actividades diarias. De pie  Al estar de  pie, mantenga la columna en la posicin neutral y los pies separados al ancho de caderas, aproximadamente. Mantenga las rodillas ligeramente flexionadas. Las [place], los hombros y las caderas deben estar alineados.  Cuando realice una tarea en la que deba estar de pie en el mismo sitio durante mucho tiempo, coloque un pie en un objeto estable de 2 a 4 pulgadas (5 a 10 cm) de alto, como un taburete. Esto ayuda a que la columna mantenga una posicin neutral.  Sentado  Cuando est sentado, mantenga la columna en posicin neutral y deje los pies apoyados en el suelo. Use un apoyapis, si es necesario, y [employer] muslos paralelos al suelo. Evite redondear los hombros e inclinar la cabeza hacia adelante.  Cuando trabaje en un escritorio o con una computadora, el  escritorio debe estar a una altura en la que las manos estn un poco ms abajo que los codos. Deslice la silla debajo del escritorio, de modo de estar lo suficientemente cerca como para mantener una buena [place].  Cuando trabaje con una computadora, coloque el monitor a una altura que le permita mirar derecho hacia adelante, sin tener que inclinar la cabeza hacia adelante o [place] atrs.  Reposo Al descansar o estar acostado, evite las posiciones que le causen ms dolor.  Si siente dolor al hacer actividades que exigen sentarse, inclinarse, agacharse o ponerse en cuclillas (actividades basadas en la flexin), acustese en una posicin en la que el cuerpo no deba doblarse mucho. Por ejemplo, evite acurrucarse de costado con los brazos y las rodillas cerca del pecho (posicin fetal).  Si siente dolor con las actividades que exigen estar de pie durante mucho tiempo o Training and development officer los brazos (actividades basadas en la extensin), acustese con la columna en una posicin neutral y flexione ligeramente las rodillas. Pruebe con las siguientes posiciones: ? Acupuncturist de costado con una almohada entre las rodillas. ? Acostarse boca arriba con una almohada  debajo de las rodillas.  Levantar objetos  Cuando tenga que levantar un objeto, mantenga los pies separados el ancho de los hombros y apriete los msculos abdominales.  [place] y la cadera, y Quarry manager la columna en posicin neutral. Es importante levantar utilizando la fuerza de las piernas, no de la espalda. No trabe las rodillas hacia afuera.  Siempre pida ayuda a otra persona para levantar objetos pesados o incmodos.  Esta informacin no tiene Marine scientist el consejo del mdico. Asegrese de hacerle al mdico cualquier pregunta que tenga. Document Released: 09/01/2006 Document Revised: 04/01/2015 Document Reviewed: 08/27/2015 Elsevier Interactive Patient Education  2018 [employer].

## 2018-07-17 NOTE — Progress Notes (Signed)
Subjective:    [name], female    DOB: [birth year], 67 y.o.   MRN: [id number] Chief Complaint  Patient presents with  . Work Related Injury    2 week follow-up from 8/7 from a work related back sprain     HPI  Here to f/u after a back injury suffered 07/02/18. She was seen initially w/ compliants of injury 07/03/18 by myself but had many personal issues that day that were evaluated instead. She was then seen for the worker's comp case initially 07/05/18 by my partner Bess Harvest for upper back pain since then, low back pain, upper posterior hip pain. Feels unsteady on her feet, has mild right knee pain. Had lumbar spine and pelvic xray at that time with no acute changes/abnml.  Prednisone and cyclobenzaprine helped minimally. Friday when she went to PT helped the most - before that she could hardly move - next PT is in 2d - then will see how often she needs to be seen. She is doing the home PT exercises which have been helping more than anything. She applying some topical creams and doing epsom salt bath soaks with warm water which helps.  No groin pain. Reports was told constipation from xray and took 3 dulcolax to respond.   Amora's medications list, allergies, past medical history, past surgical history, social history, family history were reviewed and excluded from this note due to being a worker's comp case.  Review of Systems  Constitutional: Positive for activity change. Negative for chills, diaphoresis and fever.  Respiratory: Negative for shortness of breath.   Cardiovascular: Negative for leg swelling.  Gastrointestinal: Positive for constipation. Negative for diarrhea, nausea and vomiting.  Genitourinary: Positive for frequency and urgency.  Musculoskeletal: Positive for arthralgias, back pain, gait problem and myalgias. Negative for joint swelling.  Neurological: Positive for weakness. Negative for numbness.       Objective:   Physical Exam  Constitutional:  She is oriented to person, place, and time. She appears well-developed and well-nourished.  HENT:  Head: Normocephalic.  Eyes: Conjunctivae are normal. No scleral icterus.  Neck: Normal range of motion. Neck supple.  Cardiovascular: Normal rate, regular rhythm and normal heart sounds.  Pulmonary/Chest: Effort normal and breath sounds normal. No respiratory distress.  Musculoskeletal: She exhibits no edema.       Right hip: Normal.       Left hip: Normal.       Lumbar back: She exhibits decreased range of motion (forward flexion limited to ~70 deg, extension moderately limited, pain w/ lateral rotation to right>left (but more pain on left)), tenderness (left lower lumbar paraspinal spasm, inner upper left glut, ttp left>right SI joint), bony tenderness (over lower sacrum/coccyx), pain and spasm. She exhibits no deformity.  + left straight leg raise  Neurological: She is alert and oriented to person, place, and time. She displays normal reflexes. No sensory deficit. She exhibits normal muscle tone. Coordination and gait normal.  Unable to elicit lower ext DTRs due to muscle tenseness Lower ext strength 5/5 throughout except 4/5 left hip flexor due to pain. Walk slowly flexed slightly ~10 deg at lumbar back - has to rock several times to get up out of chair before able to stand to gain momentum  Skin: Skin is warm and dry. No erythema.  Psychiatric: She has a normal mood and affect. Her behavior is normal.      BP 108/68   Pulse 88   Temp 98.5 F (36.9  C) (Oral)   Resp 16   Ht 5' 5.75" (1.67 m)   Wt 154 lb (69.9 kg)   SpO2 97%   BMI 25.05 kg/m      Assessment & Plan:   1. Strain of lumbar paraspinal muscle, subsequent encounter   Worker's comp injury from 07/02/18 Lumbar/pelvic xray 8/7 w/ no acute injury Minimal response to prednisone, flexeril qhs.  Restart ibuprofen 800 tid prn, try methocarbamol prn REFER TO PT. Cont topical icy hot, heating pads, and home exercises which have  been helping most but  Continue light duty only at work - no lifting >5 lbs, no pushing/pulling, bending, stooping, crawling, squatting, climbing etc.  Can walk, stand, sit and should change positions every 15-20 minutes minimum.  Orders Placed This Encounter  Procedures  . Ambulatory referral to Physical Therapy    Referral Priority:   Routine    Referral Type:   Physical Medicine    Referral Reason:   Specialty Services Required    Requested Specialty:   Physical Therapy    Number of Visits Requested:   1    Meds ordered this encounter  Medications  . methocarbamol (ROBAXIN) 750 MG tablet    Sig: Take 0.5-1 tablets (375-750 mg total) by mouth every 6 (six) hours as needed for muscle spasms.    Dispense:  60 tablet    Refill:  0      Delman Cheadle, M.D.  Primary Care at [facility] [address] [place], [place] [address] [phone] phone [phone] fax  07/17/18 8:58 AM

## 2018-07-19 ENCOUNTER — Encounter: Payer: Self-pay | Admitting: Physical Therapy

## 2018-07-19 ENCOUNTER — Ambulatory Visit: Payer: BLUE CROSS/BLUE SHIELD | Admitting: Physical Therapy

## 2018-07-19 DIAGNOSIS — M6281 Muscle weakness (generalized): Secondary | ICD-10-CM | POA: Diagnosis not present

## 2018-07-19 DIAGNOSIS — R278 Other lack of coordination: Secondary | ICD-10-CM

## 2018-07-19 NOTE — Patient Instructions (Signed)
Access Code: [email or web address]  URL: [email or web address]  Date: 07/19/2018  Prepared by: Earlie Counts   Exercises  Supine Transversus Abdominis Bracing with Double Leg Fallout - 10 reps - 1 sets - 5 hold - 1x daily - 7x weekly  Bent Knee Fallouts with Alternating Legs - 10 reps - 2 sets - 1x daily - 7x weekly  Hooklying Single Knee to Chest Stretch - 2 reps - 1 sets - 15 sec hold - 1x daily - 7x weekly  Supine Lower Trunk Rotation - 3 reps - 1 sets - 15 sec hold - 1x daily - 7x weekly  Cat Cow - 10 reps - 1 sets - 1x daily - 7x weekly  Child's Pose Stretch - 2 reps - 1 sets - 30 sec hold - 1x daily - 7x weekly  Child's Pose with Sidebending - 2 reps - 1 sets - 30 sec hold - 1x daily - 7x weekly  Seated Pelvic Floor Contraction - 5 reps - 1 sets - 5 sec hold - 5x daily - 7x weekly  [facility] [address] [place], [place] [address] Phone # [phone] Fax [phone]

## 2018-07-19 NOTE — Therapy (Signed)
[facility] [address] [place], [place], [address] Phone: [phone]   Fax:  [phone]  Physical Therapy Treatment  Patient Details  Name: Lisa Costa MRN: [id number] Date of Birth: December 23, 1950 Referring Provider: dr. Delman Cheadle   Encounter Date: 07/19/2018  PT End of Session - 07/19/18 1444    Visit Number  2    Date for PT Re-Evaluation  10/06/18    Authorization Type  BCBS    PT Start Time  1445    PT Stop Time  1525    PT Time Calculation (min)  40 min    Activity Tolerance  Patient tolerated treatment well    Behavior During Therapy  [facility] for tasks assessed/performed       Past Medical History:  Diagnosis Date  . Allergy   . Anxiety   . Arthritis   . Cataract    both eyes  . Depression   . GERD (gastroesophageal reflux disease)   . Heart murmur   . Hx of adenomatous polyp of colon 03/16/2018  . Hypercholesteremia   . Left radial head fracture   . Osteoporosis   . Tuberculosis    vaccine in [place] PPD tests positive each time/had treatment for 4 months    Past Surgical History:  Procedure Laterality Date  . CESAREAN SECTION     3 births  . COLONOSCOPY    . POLYPECTOMY      There were no vitals filed for this visit.  Subjective Assessment - 07/19/18 1444    Subjective  The urinary leakage is better.  It is not as frequently. Leakage is 20% better.     Patient Stated Goals  stop leakage, stop using pads    Currently in Pain?  Yes    Pain Score  5     Pain Location  Back    Pain Orientation  Mid;Lower    Pain Descriptors / Indicators  Sharp;Shooting;Stabbing    Pain Type  Acute pain    Pain Onset  More than a month ago    Pain Frequency  Intermittent    Aggravating Factors   sit ti stand, movement    Multiple Pain Sites  No                    Pelvic Floor Special Questions - 07/19/18 0001    Pelvic Floor Internal Exam  Patient confirms identification adn  approves PT to assess the pelvic floor and treat    Exam Type  Vaginal    Strength  fair squeeze, definite lift        [facility] Adult PT Treatment/Exercise - 07/19/18 0001      Neuro Re-ed    Neuro Re-ed Details   pelvic floor contraction with tactile cues to the vaginal sphincter muscles      Exercises   Exercises  Lumbar      Lumbar Exercises: Stretches   Single Knee to Chest Stretch  Right;Left;2 reps;30 seconds    Lower Trunk Rotation  2 reps;30 seconds    Prone Mid Back Stretch  3 reps;30 seconds   midline both sides hold 30 sec     Lumbar Exercises: Supine   Ab Set  20 reps;1 second   needed many tactile and verbal cues   Clam  20 reps;1 second   tactile cues to keep hips steady     Manual Therapy   Manual Therapy  Internal Pelvic Floor  Internal Pelvic Floor  soft tissue work to obturator internist, levator ani, urethra sphincter             PT Education - 07/19/18 1526    Education Details  Access Code: [email or web address]     Person(s) Educated  Patient    Methods  Explanation;Demonstration;Verbal cues;Handout    Comprehension  Returned demonstration;Verbalized understanding       PT Short Term Goals - 07/19/18 1530      PT SHORT TERM GOAL #1   Title  independent with initial HEP    Time  4    Period  Weeks    Status  On-going      PT SHORT TERM GOAL #2   Title  urinary leakage decreased >/= 25% due to improved strength and coordination    Baseline  20%    Time  4    Period  Weeks    Status  On-going        PT Long Term Goals - 07/14/18 1145      PT LONG TERM GOAL #1   Title  independent with initial HEP    Time  12    Period  Weeks    Status  New    Target Date  10/06/18      PT LONG TERM GOAL #2   Title  wears no pads due to urinary leakage decreased due to improved strength and coordination    Time  12    Period  Weeks    Status  New    Target Date  10/06/18      PT LONG TERM GOAL #3   Title  pelvic floor strength >/= 4/5 decreasing  urinary leakage with sneezing, coughing, laughing by 80%    Time  12    Period  Weeks    Status  New    Target Date  10/06/18      PT LONG TERM GOAL #4   Title  ability to contract the pelvic floor without holding her breath with pelvic floor contraction    Time  12    Period  Weeks    Status  New    Target Date  10/06/18            Plan - 07/19/18 1450    Clinical Impression Statement  Patient reports her urinary leakage is 20% better. Patient is able to make a circular contraction after soft tissue work of the pelvic floor Patient is ready to do the pelvic floor exercises in sitting. Patietn will benefit from  skilled therapy to improve pelvic floor strength and coordination to reduce urinary leakage.    Rehab Potential  Excellent    Clinical Impairments Affecting Rehab Potential  none    PT Frequency  1x / week    PT Duration  12 weeks    PT Treatment/Interventions  Biofeedback;Therapeutic activities;Therapeutic exercise;Patient/family education;Neuromuscular re-education;Manual techniques    PT Next Visit Plan  pelvic floor contraction quick flicks, bladder irritants, abdominal contraction    PT Home Exercise Plan  Access Code: [email or web address]     Consulted and Agree with Plan of Care  Patient       Patient will benefit from skilled therapeutic intervention in order to improve the following deficits and impairments:  Pain, Decreased activity tolerance, Decreased endurance, Decreased strength  Visit Diagnosis: Muscle weakness (generalized)  Other lack of coordination     Problem List Patient Active Problem List   Diagnosis Date Noted  . Recurrent  UTI 05/03/2017  . Hx of adenomatous polyp of colon 01/10/2012  . ALLERGIC RHINITIS 08/27/2010  . STRESS INCONTINENCE 12/22/2009  . HYPERCHOLESTEROLEMIA 03/24/2009  . LUNG NODULE 01/03/2009  . TB SKIN TEST, POSITIVE 01/02/2009  . MICROSCOPIC HEMATURIA 12/31/2008  . VAGINAL MASS 12/05/2008  . ANXIETY DEPRESSION 07/22/2008  .  Disorder of bone and cartilage 05/07/2008  . DECREASED HEARING 05/06/2008  . DENTAL CARIES 10/03/2007  . GERD 10/03/2007    Earlie Counts, PT 07/19/18 3:31 PM   [facility] [address], [place], [place], [address] Phone: [phone]   Fax:  [phone]  Name: Kirby Argueta MRN: [id number] Date of Birth: 18-Aug-1951

## 2018-07-24 LAB — HM DEXA SCAN

## 2018-07-26 ENCOUNTER — Encounter: Payer: Self-pay | Admitting: Physical Therapy

## 2018-07-26 ENCOUNTER — Ambulatory Visit: Payer: BLUE CROSS/BLUE SHIELD | Admitting: Physical Therapy

## 2018-07-26 DIAGNOSIS — M6281 Muscle weakness (generalized): Secondary | ICD-10-CM

## 2018-07-26 DIAGNOSIS — R278 Other lack of coordination: Secondary | ICD-10-CM

## 2018-07-26 NOTE — Therapy (Addendum)
[facility] [address] [place], [place], [address] Phone: [phone]   Fax:  [phone]  Physical Therapy Treatment  Patient Details  Name: Lisa Costa MRN: [id number] Date of Birth: 16-Aug-1951 Referring Provider: dr. Delman Cheadle   Encounter Date: 07/26/2018  PT End of Session - 07/26/18 1236    Visit Number  3    Date for PT Re-Evaluation  10/06/18    Authorization Type  BCBS    PT Start Time  1236   working on her monitor   PT Stop Time  1315    PT Time Calculation (min)  39 min    Activity Tolerance  Patient tolerated treatment well    Behavior During Therapy  [facility] for tasks assessed/performed       Past Medical History:  Diagnosis Date  . Allergy   . Anxiety   . Arthritis   . Cataract    both eyes  . Depression   . GERD (gastroesophageal reflux disease)   . Heart murmur   . Hx of adenomatous polyp of colon 03/16/2018  . Hypercholesteremia   . Left radial head fracture   . Osteoporosis   . Tuberculosis    vaccine in [place] PPD tests positive each time/had treatment for 4 months    Past Surgical History:  Procedure Laterality Date  . CESAREAN SECTION     3 births  . COLONOSCOPY    . POLYPECTOMY      There were no vitals filed for this visit.  Subjective Assessment - 07/26/18 1237    Subjective  If I do not drink water I have no issues.  If I drink water like I use to, I will have leakage.     Patient Stated Goals  stop leakage, stop using pads    Currently in Pain?  Yes    Pain Score  4     Pain Location  Back    Pain Orientation  Mid;Lower    Pain Descriptors / Indicators  Sharp;Shooting;Stabbing    Pain Onset  More than a month ago    Pain Frequency  Intermittent    Aggravating Factors   sit to stand, movement    Multiple Pain Sites  No                    Pelvic Floor Special Questions - 07/26/18 0001    Strength  fair squeeze, definite lift         [facility] Adult PT Treatment/Exercise - 07/26/18 0001      Neuro Re-ed    Neuro Re-ed Details   pelvic floor contraction in sitting and supine using ball squeeze and hip adduction; Trained patient on how to avoid the urge to void when hearing running water; progression of  pelvic floor contraction with coughing in sitting             PT Education - 07/26/18 1311    Education Details  advanced pelvic floor exercises in supine and sitting; progression of pelvic floor exercises with coughing    Person(s) Educated  Patient    Methods  Explanation;Demonstration;Handout    Comprehension  Verbalized understanding;Returned demonstration       PT Short Term Goals - 07/26/18 1320      PT SHORT TERM GOAL #1   Title  independent with initial HEP    Time  4    Period  Weeks    Status  Achieved  PT SHORT TERM GOAL #2   Title  urinary leakage decreased >/= 25% due to improved strength and coordination    Baseline  20%    Time  4    Period  Weeks    Status  On-going        PT Long Term Goals - 07/14/18 1145      PT LONG TERM GOAL #1   Title  independent with initial HEP    Time  12    Period  Weeks    Status  New    Target Date  10/06/18      PT LONG TERM GOAL #2   Title  wears no pads due to urinary leakage decreased due to improved strength and coordination    Time  12    Period  Weeks    Status  New    Target Date  10/06/18      PT LONG TERM GOAL #3   Title  pelvic floor strength >/= 4/5 decreasing urinary leakage with sneezing, coughing, laughing by 80%    Time  12    Period  Weeks    Status  New    Target Date  10/06/18      PT LONG TERM GOAL #4   Title  ability to contract the pelvic floor without holding her breath with pelvic floor contraction    Time  12    Period  Weeks    Status  New    Target Date  10/06/18            Plan - 07/26/18 1236    Clinical Impression Statement  Patient is having therapy for her back at another facility 3 times  per week and having trouble with taking off so much work.  Patient will work on her HEP till she is done with her back therapy then return to pelvic floor therapy.  Patient is able to feel herself contract the pelvic floor. Patient has decreased urinary leakage when she does not drink water.  Patient will benefit from skilled therapy to improve pelvic floor strength and coordination.     Rehab Potential  Excellent    Clinical Impairments Affecting Rehab Potential  none    PT Frequency  1x / week    PT Duration  12 weeks    PT Treatment/Interventions  Biofeedback;Therapeutic activities;Therapeutic exercise;Patient/family education;Neuromuscular re-education;Manual techniques    PT Next Visit Plan  reassess patient when she returns . Patient will be back after 5 weeks of back therapy    PT Home Exercise Plan  Access Code: [email or web address]     Recommended Other Services  MD signed the initial eval    Consulted and Agree with Plan of Care  Patient       Patient will benefit from skilled therapeutic intervention in order to improve the following deficits and impairments:  Pain, Decreased activity tolerance, Decreased endurance, Decreased strength  Visit Diagnosis: Muscle weakness (generalized)  Other lack of coordination     Problem List Patient Active Problem List   Diagnosis Date Noted  . Recurrent UTI 05/03/2017  . Hx of adenomatous polyp of colon 01/10/2012  . ALLERGIC RHINITIS 08/27/2010  . STRESS INCONTINENCE 12/22/2009  . HYPERCHOLESTEROLEMIA 03/24/2009  . LUNG NODULE 01/03/2009  . TB SKIN TEST, POSITIVE 01/02/2009  . MICROSCOPIC HEMATURIA 12/31/2008  . VAGINAL MASS 12/05/2008  . ANXIETY DEPRESSION 07/22/2008  . Disorder of bone and cartilage 05/07/2008  . DECREASED HEARING 05/06/2008  . DENTAL CARIES 10/03/2007  .  GERD 10/03/2007    Earlie Counts, PT 07/26/18 1:21 PM   [facility] [address] [place], [place],  [address] Phone: [phone]   Fax:  [phone]  Name: Lisa Costa MRN: [id number] Date of Birth: 1951/03/03 PHYSICAL THERAPY DISCHARGE SUMMARY  Visits from Start of Care: 3  Current functional level related to goals / functional outcomes: See above.    Remaining deficits: See above. Unable to assess patient due to not returning since visit on 07/26/2018.   Education / Equipment: HEP Plan:                                                    Patient goals were not met. Patient is being discharged due to not returning since the last visit. Thank you for the referral. Earlie Counts, PT 09/18/18 5:04 PM   ?????

## 2018-07-26 NOTE — Patient Instructions (Addendum)
External Rotation: Hip - Knees Apart With Pelvic Floor (Hook-Lying)    Lie with hips and knees bent, band tied just above knees. Squeeze pelvic floor while pulling knees apart. Hold for _5__ seconds. Rest for _5__ seconds. Repeat _10__ times. Do __1_ times a day.   Copyright  VHI. All rights reserved.    Adduction: Hip - Knees Together With Pelvic Floor (Hook-Lying)    Lie with hips and knees bent, towel roll between knees. Squeeze pelvic floor while pushing knees together. Hold for _5__ seconds. Rest for _5__ seconds. Repeat _10__ times. Do _1__ times a day.   Copyright  VHI. All rights reserved.  Adduction: Hip - Knees Together With Pelvic Floor (Sitting)    Sit with towel roll between knees. Squeeze pelvic floor while pushing knees together. Hold for _5__ seconds. Rest for _5__ seconds. Repeat _5__ times. Do _1__ times a day.  Copyright  VHI. All rights reserved.  Quick Contraction: Gravity Resisted (Sitting)    Sitting, quickly squeeze then fully relax pelvic floor. Perform _1__ sets of _5__. Rest for _1__ seconds between sets. Do __3_ times a day.  Copyright  VHI. All rights reserved.  Slow Contraction: Gravity Resisted (Sitting)    Sitting, slowly squeeze pelvic floor for __5_ seconds. Rest for _5__ seconds. Repeat _10__ times. Do _3__ times a day.  Copyright  VHI. All rights reserved.  Cough: Phase 3 (Sitting)    Squeeze pelvic floor and hold. Inhale. Lean shoulders forward. Cough. Relax. Repeat __10_ times. Do _1__ times a day.  Copyright  VHI. All rights reserved.   When you hear the water running  Sit down and be still Squeeze the pelvic floor 5 times  Once the urge is gone go about your day.  [facility] [address] [place], [place] [address] Phone # [phone] Fax [phone]

## 2018-08-18 ENCOUNTER — Ambulatory Visit: Payer: BLUE CROSS/BLUE SHIELD | Admitting: Emergency Medicine

## 2018-08-18 ENCOUNTER — Encounter: Payer: Self-pay | Admitting: Emergency Medicine

## 2018-08-18 ENCOUNTER — Other Ambulatory Visit: Payer: Self-pay

## 2018-08-18 VITALS — BP 119/56 | HR 70 | Temp 98.0°F | Resp 16 | Ht 65.0 in | Wt 158.0 lb

## 2018-08-18 DIAGNOSIS — R350 Frequency of micturition: Secondary | ICD-10-CM | POA: Diagnosis not present

## 2018-08-18 DIAGNOSIS — N760 Acute vaginitis: Secondary | ICD-10-CM | POA: Diagnosis not present

## 2018-08-18 DIAGNOSIS — N898 Other specified noninflammatory disorders of vagina: Secondary | ICD-10-CM | POA: Diagnosis not present

## 2018-08-18 LAB — POCT URINALYSIS DIP (MANUAL ENTRY)
BILIRUBIN UA: NEGATIVE mg/dL
Bilirubin, UA: NEGATIVE
Blood, UA: NEGATIVE
GLUCOSE UA: NEGATIVE mg/dL
LEUKOCYTES UA: NEGATIVE
Nitrite, UA: NEGATIVE
PROTEIN UA: NEGATIVE mg/dL
Spec Grav, UA: 1.025 (ref 1.010–1.025)
UROBILINOGEN UA: 0.2 U/dL
pH, UA: 5.5 (ref 5.0–8.0)

## 2018-08-18 MED ORDER — FLUCONAZOLE 150 MG PO TABS: ORAL_TABLET | ORAL | 1 refills | Status: DC

## 2018-08-18 NOTE — Progress Notes (Signed)
[facility] Lisa Costa 67 y.o.   Chief Complaint  Patient presents with  . Urinary Frequency  . Vaginal Itching    HISTORY OF PRESENT ILLNESS: This is a 67 y.o. female complaining of urinary frequency and vaginal itching for the past several days.  Has a history of recurrent UTIs.  Also complaining of nausea but no vomiting.  Denies fever or chills.  Has a history of urinary incontinence.  No other significant symptoms.  HPI   Prior to Admission medications   Medication Sig Start Date End Date Taking? Authorizing Provider  aspirin EC 81 MG tablet Take 81 mg by mouth daily.   Yes [provider]  AZELASTINE HCL OP Apply 1 drop to eye as needed.   Yes [provider]  calcium-vitamin D (OSCAL) 250-125 MG-UNIT per tablet Take 1 tablet by mouth daily. Reported on 11/25/2015   Yes [provider]  Cholecalciferol (VITAMIN D3) 1000 units CAPS Take by mouth.   Yes [provider]  cyclobenzaprine (FLEXERIL) 5 MG tablet Take 1 tablet (5 mg total) by mouth at bedtime as needed. 07/05/18  Yes Jaynee Eagles, PA-C  ibuprofen (ADVIL,MOTRIN) 800 MG tablet Take 1 tablet (800 mg total) by mouth every 8 (eight) hours as needed. 06/29/18  Yes Tenna Delaine D, PA-C  meclizine (ANTIVERT) 12.5 MG tablet Take 1 tablet (12.5 mg total) by mouth 3 (three) times daily as needed for dizziness. 05/21/17  Yes Shawnee Knapp, MD  methocarbamol (ROBAXIN) 750 MG tablet Take 0.5-1 tablets (375-750 mg total) by mouth every 6 (six) hours as needed for muscle spasms. 07/17/18  Yes Shawnee Knapp, MD  pantoprazole (PROTONIX) 40 MG tablet Take 1 tablet (40 mg total) by mouth daily. 07/03/18  Yes Shawnee Knapp, MD  pravastatin (PRAVACHOL) 40 MG tablet Take 1 tablet (40 mg total) by mouth daily. 07/03/18  Yes Shawnee Knapp, MD  tolterodine (DETROL) 2 MG tablet Take 1 tablet (2 mg total) by mouth at bedtime. 07/03/18  Yes Shawnee Knapp, MD    Allergies  Allergen Reactions  . Tramadol Nausea And Vomiting      Patient Active Problem List   Diagnosis Date Noted  . Recurrent UTI 05/03/2017  . Hx of adenomatous polyp of colon 01/10/2012  . STRESS INCONTINENCE 12/22/2009  . HYPERCHOLESTEROLEMIA 03/24/2009  . LUNG NODULE 01/03/2009  . MICROSCOPIC HEMATURIA 12/31/2008  . VAGINAL MASS 12/05/2008  . ANXIETY DEPRESSION 07/22/2008  . Disorder of bone and cartilage 05/07/2008  . DECREASED HEARING 05/06/2008  . GERD 10/03/2007    Past Medical History:  Diagnosis Date  . Allergy   . Anxiety   . Arthritis   . Cataract    both eyes  . Depression   . GERD (gastroesophageal reflux disease)   . Heart murmur   . Hx of adenomatous polyp of colon 03/16/2018  . Hypercholesteremia   . Left radial head fracture   . Osteoporosis   . Tuberculosis    vaccine in [place] PPD tests positive each time/had treatment for 4 months    Past Surgical History:  Procedure Laterality Date  . CESAREAN SECTION     3 births  . COLONOSCOPY    . POLYPECTOMY      Social History   Socioeconomic History  . Marital status: Single    Spouse name: Not on file  . Number of children: 3  . Years of education: Not on file  . Highest education level: Not on file  Occupational History  .  Not on file  Social Needs  . Financial resource strain: Not on file  . Food insecurity:    Worry: Not on file    Inability: Not on file  . Transportation needs:    Medical: Not on file    Non-medical: Not on file  Tobacco Use  . Smoking status: Former Smoker    Types: Cigarettes    Last attempt to quit: 03/02/2008    Years since quitting: 10.4  . Smokeless tobacco: Never Used  Substance and Sexual Activity  . Alcohol use: Yes    Alcohol/week: 2.0 standard drinks    Types: 2 Standard drinks or equivalent per week    Comment: occational  . Drug use: No  . Sexual activity: Never  Lifestyle  . Physical activity:    Days per week: Not on file    Minutes per session: Not on file  . Stress: Not on file  Relationships  .  Social connections:    Talks on phone: Not on file    Gets together: Not on file    Attends religious service: Not on file    Active member of club or organization: Not on file    Attends meetings of clubs or organizations: Not on file    Relationship status: Not on file  . Intimate partner violence:    Fear of current or ex partner: Not on file    Emotionally abused: Not on file    Physically abused: Not on file    Forced sexual activity: Not on file  Other Topics Concern  . Not on file  Social History Narrative  . Not on file    Family History  Problem Relation Age of Onset  . Heart disease Mother   . Colon cancer Neg Hx   . Colon polyps Neg Hx   . Esophageal cancer Neg Hx   . Rectal cancer Neg Hx   . Stomach cancer Neg Hx      Review of Systems  Constitutional: Negative.  Negative for fever.  HENT: Negative.  Negative for sore throat.   Eyes: Negative for discharge and redness.  Respiratory: Negative.  Negative for cough and shortness of breath.   Cardiovascular: Negative.  Negative for chest pain and palpitations.  Gastrointestinal: Negative.  Negative for abdominal pain, diarrhea, nausea and vomiting.  Genitourinary: Positive for frequency.       Vaginal itching  Musculoskeletal: Negative for back pain, myalgias and neck pain.  Skin: Negative.  Negative for rash.  Neurological: Negative.  Negative for dizziness and headaches.  Endo/Heme/Allergies: Negative.   All other systems reviewed and are negative.  Vitals:   08/18/18 1141  BP: (!) 119/56  Pulse: 70  Resp: 16  Temp: 98 F (36.7 C)  SpO2: 95%     Physical Exam  Constitutional: She is oriented to person, place, and time. She appears well-developed and well-nourished.  HENT:  Head: Normocephalic and atraumatic.  Eyes: Pupils are equal, round, and reactive to light. EOM are normal.  Neck: Normal range of motion. Neck supple.  Cardiovascular: Normal rate and regular rhythm.  Pulmonary/Chest: Effort  normal and breath sounds normal.  Abdominal: Soft. Bowel sounds are normal. She exhibits no distension. There is no tenderness.  Musculoskeletal: Normal range of motion.  Neurological: She is alert and oriented to person, place, and time. No sensory deficit. She exhibits normal muscle tone.  Skin: Skin is warm and dry. Capillary refill takes less than 2 seconds.  Psychiatric: She has a  normal mood and affect. Her behavior is normal.  Vitals reviewed.  Results for orders placed or performed in visit on 08/18/18 (from the past 24 hour(s))  POCT urinalysis dipstick     Status: None   Collection Time: 08/18/18 11:55 AM  Result Value Ref Range   Color, UA yellow yellow   Clarity, UA clear clear   Glucose, UA negative negative mg/dL   Bilirubin, UA negative negative   Ketones, POC UA negative negative mg/dL   Spec Grav, UA 1.025 1.010 - 1.025   Blood, UA negative negative   pH, UA 5.5 5.0 - 8.0   Protein Ur, POC negative negative mg/dL   Urobilinogen, UA 0.2 0.2 or 1.0 E.U./dL   Nitrite, UA Negative Negative   Leukocytes, UA Negative Negative    A total of 25 minutes was spent in the room with the patient, greater than 50% of which was in counseling/coordination of care regarding differential diagnosis, treatment, medications, and need for follow-up  . ASSESSMENT & PLAN: Lisa Costa was seen today for urinary frequency and vaginal itching.  Diagnoses and all orders for this visit:  Vaginal itching  Urinary frequency -     POCT urinalysis dipstick -     Urine Culture  Vaginitis and vulvovaginitis -     fluconazole (DIFLUCAN) 150 MG tablet; Sig one (1) tablet daily for 3 days.    Patient Instructions       If you have lab work done today you will be contacted with your lab results within the next 2 weeks.  If you have not heard from [place] then please contact us. The fastest way to get your results is to register for My Chart.   IF you received an x-ray today, you will receive an  invoice from [facility]. Please contact [facility] at [phone] with questions or concerns regarding your invoice.   IF you received labwork today, you will receive an invoice from [facility]. Please contact [facility] at [phone] with questions or concerns regarding your invoice.   Our billing staff will not be able to assist you with questions regarding bills from these companies.  You will be contacted with the lab results as soon as they are available. The fastest way to get your results is to activate your My Chart account. Instructions are located on the last page of this paperwork. If you have not heard from [place] regarding the results in 2 weeks, please contact this office.      Vaginitis (Vaginitis) La vaginitis es la inflamacin de la vagina. Se produce cuando las bacterias y los hongos que normalmente se encuentran en la vagina se desarrollan demasiado. Hay diferentes tipos. El tratamiento depender del tipo de infeccin. CUIDADOS EN EL HOGAR  Tome los medicamentos como le indic el mdico.  Mantenga la zona vaginal limpia y [place]. Evite el jabn. Enjuague el rea con agua.  Evite las irrigaciones y el lavado brusco (duchas vaginales).  No utilice tampones ni tenga sexo (relaciones sexuales) hasta despus del tratamiento.  Higiencese de adelante hacia atrs despus de ir al bao.  Use ropa interior de algodn.  Evite el uso de ropa interior cuando duerme hasta que la vaginitis haya mejorado.  Evite los pantalones apretados. Evite la ropa interior o medias de nylon que no tengan un panel de algodn.  Qutese la ropa hmeda (por ejemplo el traje de bao) tan pronto como sea posible.  Utilice productos suaves sin perfume. Evite los suavizantes y perfumes para telas. ? Los  aerosoles ntimos. ? Los detergentes para la ropa. ? Los tampones. ? Jabones o baos de espuma.  Practique el sexo seguro y use condones.  SOLICITE AYUDA DE INMEDIATO  SI:  Siente dolor en el vientre (abdominal).  Tiene fiebre o sntomas que persisten durante ms de 2 o 3 das.  Tiene fiebre y los sntomas empeoran repentinamente.  ASEGRESE DE QUE:  Comprende estas instrucciones.  Controlar la enfermedad.  Solicitar ayuda de inmediato si no mejora o si empeora.  Esta informacin no tiene Marine scientist el consejo del mdico. Asegrese de hacerle al mdico cualquier pregunta que tenga. Document Released: 08/09/2012 Document Revised: 08/09/2012 Document Reviewed: 04/27/2012 Elsevier Interactive Patient Education  2017 Elsevier Inc.      Agustina Caroli, MD [facility]

## 2018-08-18 NOTE — Patient Instructions (Addendum)
     If you have lab work done today you will be contacted with your lab results within the next 2 weeks.  If you have not heard from [place] then please contact us. The fastest way to get your results is to register for My Chart.   IF you received an x-ray today, you will receive an invoice from [facility]. Please contact [facility] at [phone] with questions or concerns regarding your invoice.   IF you received labwork today, you will receive an invoice from [facility]. Please contact [facility] at [phone] with questions or concerns regarding your invoice.   Our billing staff will not be able to assist you with questions regarding bills from these companies.  You will be contacted with the lab results as soon as they are available. The fastest way to get your results is to activate your My Chart account. Instructions are located on the last page of this paperwork. If you have not heard from [place] regarding the results in 2 weeks, please contact this office.      Vaginitis (Vaginitis) La vaginitis es la inflamacin de la vagina. Se produce cuando las bacterias y los hongos que normalmente se encuentran en la vagina se desarrollan demasiado. Hay diferentes tipos. El tratamiento depender del tipo de infeccin. CUIDADOS EN EL HOGAR  Tome los medicamentos como le indic el mdico.  Mantenga la zona vaginal limpia y [place]. Evite el jabn. Enjuague el rea con agua.  Evite las irrigaciones y el lavado brusco (duchas vaginales).  No utilice tampones ni tenga sexo (relaciones sexuales) hasta despus del tratamiento.  Higiencese de adelante hacia atrs despus de ir al bao.  Use ropa interior de algodn.  Evite el uso de ropa interior cuando duerme hasta que la vaginitis haya mejorado.  Evite los pantalones apretados. Evite la ropa interior o medias de nylon que no tengan un panel de algodn.  Qutese la ropa hmeda (por ejemplo el traje de bao) tan pronto como sea  posible.  Utilice productos suaves sin perfume. Evite los suavizantes y perfumes para telas. ? Los aerosoles ntimos. ? Los detergentes para la ropa. ? Los tampones. ? Jabones o baos de espuma.  Practique el sexo seguro y use condones.  SOLICITE AYUDA DE INMEDIATO SI:  Siente dolor en el vientre (abdominal).  Tiene fiebre o sntomas que persisten durante ms de 2 o 3 das.  Tiene fiebre y los sntomas empeoran repentinamente.  ASEGRESE DE QUE:  Comprende estas instrucciones.  Controlar la enfermedad.  Solicitar ayuda de inmediato si no mejora o si empeora.  Esta informacin no tiene Marine scientist el consejo del mdico. Asegrese de hacerle al mdico cualquier pregunta que tenga. Document Released: 08/09/2012 Document Revised: 08/09/2012 Document Reviewed: 04/27/2012 Elsevier Interactive Patient Education  2017 [employer].

## 2018-08-20 LAB — URINE CULTURE: Organism ID, Bacteria: NO GROWTH

## 2018-10-23 ENCOUNTER — Telehealth: Payer: Self-pay | Admitting: Family Medicine

## 2018-10-23 NOTE — Telephone Encounter (Signed)
Attempted to call pharmacy- they are closed until 2 pm. ( Rx sent as 90 day supply)

## 2018-10-23 NOTE — Telephone Encounter (Signed)
Copied from [place]. Topic: Quick Communication - Rx Refill/Question >> Oct 23, 2018  9:47 AM Virl Axe D wrote: Medication: pravastatin (PRAVACHOL) 40 MG tablet / Pt is requesting 90 day supply   Has the patient contacted their pharmacy? No. (Agent: If no, request that the patient contact the pharmacy for the refill.) (Agent: If yes, when and what did the pharmacy advise?)  Preferred Pharmacy (with phone number or street name): [place], [place] - [address] [phone] (Phone) [phone] (Fax)    Agent: Please be advised that RX refills may take up to 3 business days. We ask that you follow-up with your pharmacy.

## 2018-10-24 NOTE — Telephone Encounter (Signed)
[facility] called and spoke to Lisa Costa, Merchant navy officer about the medication request. She says the patient picked up on 10/23/18 and has refills available.

## 2018-11-06 ENCOUNTER — Ambulatory Visit: Payer: BLUE CROSS/BLUE SHIELD | Admitting: Family Medicine

## 2018-11-08 ENCOUNTER — Ambulatory Visit: Payer: BLUE CROSS/BLUE SHIELD | Admitting: Family Medicine

## 2018-11-08 ENCOUNTER — Other Ambulatory Visit: Payer: Self-pay

## 2018-11-08 ENCOUNTER — Encounter: Payer: Self-pay | Admitting: Family Medicine

## 2018-11-08 ENCOUNTER — Ambulatory Visit (INDEPENDENT_AMBULATORY_CARE_PROVIDER_SITE_OTHER): Payer: BLUE CROSS/BLUE SHIELD

## 2018-11-08 VITALS — BP 130/74 | HR 64 | Temp 98.0°F | Resp 16 | Ht 64.96 in | Wt 158.0 lb

## 2018-11-08 DIAGNOSIS — R739 Hyperglycemia, unspecified: Secondary | ICD-10-CM

## 2018-11-08 DIAGNOSIS — N39498 Other specified urinary incontinence: Secondary | ICD-10-CM

## 2018-11-08 DIAGNOSIS — Z23 Encounter for immunization: Secondary | ICD-10-CM

## 2018-11-08 DIAGNOSIS — K2100 Gastro-esophageal reflux disease with esophagitis, without bleeding: Secondary | ICD-10-CM

## 2018-11-08 DIAGNOSIS — E7800 Pure hypercholesterolemia, unspecified: Secondary | ICD-10-CM | POA: Diagnosis not present

## 2018-11-08 DIAGNOSIS — M79644 Pain in right finger(s): Secondary | ICD-10-CM | POA: Diagnosis not present

## 2018-11-08 DIAGNOSIS — R3129 Other microscopic hematuria: Secondary | ICD-10-CM | POA: Diagnosis not present

## 2018-11-08 DIAGNOSIS — M2550 Pain in unspecified joint: Secondary | ICD-10-CM

## 2018-11-08 DIAGNOSIS — N390 Urinary tract infection, site not specified: Secondary | ICD-10-CM | POA: Diagnosis not present

## 2018-11-08 LAB — POCT URINALYSIS DIP (MANUAL ENTRY)
BILIRUBIN UA: NEGATIVE
BILIRUBIN UA: NEGATIVE mg/dL
Glucose, UA: NEGATIVE mg/dL
Leukocytes, UA: NEGATIVE
NITRITE UA: NEGATIVE
Protein Ur, POC: NEGATIVE mg/dL
Spec Grav, UA: 1.025 (ref 1.010–1.025)
Urobilinogen, UA: 0.2 E.U./dL
pH, UA: 5 (ref 5.0–8.0)

## 2018-11-08 LAB — POCT GLYCOSYLATED HEMOGLOBIN (HGB A1C): Hemoglobin A1C: 5.6 % (ref 4.0–5.6)

## 2018-11-08 LAB — POC MICROSCOPIC URINALYSIS (UMFC): Mucus: ABSENT

## 2018-11-08 MED ORDER — IBUPROFEN 800 MG PO TABS: 800.0000 mg | ORAL_TABLET | Freq: Three times a day (TID) | ORAL | 0 refills | Status: AC | PRN

## 2018-11-08 MED ORDER — DILTIAZEM HCL 30 MG PO TABS: ORAL_TABLET | ORAL | 1 refills | Status: DC

## 2018-11-08 MED ORDER — OLOPATADINE HCL 0.2 % OP SOLN: 1.0000 [drp] | Freq: Every day | OPHTHALMIC | 3 refills | Status: DC

## 2018-11-08 MED ORDER — PRAVASTATIN SODIUM 40 MG PO TABS: 40.0000 mg | ORAL_TABLET | Freq: Every day | ORAL | 3 refills | Status: DC

## 2018-11-08 MED ORDER — PANTOPRAZOLE SODIUM 40 MG PO TBEC: 40.0000 mg | DELAYED_RELEASE_TABLET | Freq: Every day | ORAL | 3 refills | Status: DC

## 2018-11-08 NOTE — Progress Notes (Signed)
Subjective:    Patient: Lisa Costa  DOB: Feb 21, 1951; 67 y.o.   MRN: [id number]  Chief Complaint  Patient presents with  . Chronic Conditions    HPI Back pain and left hip hurting - hip hurting left laterall up along pelvic ridge  Urine sxs resolved.    Req to be checked for Parkinson's. Has an episode of myoclonus about 3x/wk - in arms and occ head - has not noticed any in legs =- going on for along time but become more freq - very anxious so thinks could be due to stress.   Eye itching - she is using azelastine eye drops and cetirizine po   Changing insurance paln   Medical History Past Medical History:  Diagnosis Date  . Allergy   . Anxiety   . Arthritis   . Cataract    both eyes  . Depression   . GERD (gastroesophageal reflux disease)   . Heart murmur   . Hx of adenomatous polyp of colon 03/16/2018  . Hypercholesteremia   . Left radial head fracture   . Osteoporosis   . Tuberculosis    vaccine in [place] PPD tests positive each time/had treatment for 4 months   Past Surgical History:  Procedure Laterality Date  . CESAREAN SECTION     3 births  . COLONOSCOPY    . POLYPECTOMY     Current Outpatient Medications on File Prior to Visit  Medication Sig Dispense Refill  . aspirin EC 81 MG tablet Take 81 mg by mouth daily.    . AZELASTINE HCL OP Apply 1 drop to eye as needed.    . calcium-vitamin D (OSCAL) 250-125 MG-UNIT per tablet Take 1 tablet by mouth daily. Reported on 11/25/2015    . chlorhexidine (PERIDEX) 0.12 % solution   0  . Cholecalciferol (VITAMIN D3) 1000 units CAPS Take by mouth.    . cyclobenzaprine (FLEXERIL) 5 MG tablet Take 1 tablet (5 mg total) by mouth at bedtime as needed. 30 tablet 1  . diltiazem (CARDIZEM) 30 MG tablet TAKE 1 TABLET BY MOUTH AS NEEDED FOR PALPITATIONS. MAX DOSE 4 TABLETS PER 24HOUR PERIOD  0  . fluconazole (DIFLUCAN) 150 MG tablet Sig one (1) tablet daily for 3 days. 3 tablet 1  . ibuprofen (ADVIL,MOTRIN) 800  MG tablet Take 1 tablet (800 mg total) by mouth every 8 (eight) hours as needed. 270 tablet 0  . meclizine (ANTIVERT) 12.5 MG tablet Take 1 tablet (12.5 mg total) by mouth 3 (three) times daily as needed for dizziness. 30 tablet 6  . methocarbamol (ROBAXIN) 750 MG tablet Take 0.5-1 tablets (375-750 mg total) by mouth every 6 (six) hours as needed for muscle spasms. 60 tablet 0  . pantoprazole (PROTONIX) 40 MG tablet Take 1 tablet (40 mg total) by mouth daily. 90 tablet 3  . pravastatin (PRAVACHOL) 40 MG tablet Take 1 tablet (40 mg total) by mouth daily. 90 tablet 3  . tolterodine (DETROL) 2 MG tablet Take 1 tablet (2 mg total) by mouth at bedtime. 90 tablet 1   No current facility-administered medications on file prior to visit.    Allergies  Allergen Reactions  . Tramadol Nausea And Vomiting   Family History  Problem Relation Age of Onset  . Heart disease Mother   . Colon cancer Neg Hx   . Colon polyps Neg Hx   . Esophageal cancer Neg Hx   . Rectal cancer Neg Hx   . Stomach cancer Neg Hx  Social History   Socioeconomic History  . Marital status: Single    Spouse name: Not on file  . Number of children: 3  . Years of education: Not on file  . Highest education level: Not on file  Occupational History  . Not on file  Social Needs  . Financial resource strain: Not on file  . Food insecurity:    Worry: Not on file    Inability: Not on file  . Transportation needs:    Medical: Not on file    Non-medical: Not on file  Tobacco Use  . Smoking status: Former Smoker    Types: Cigarettes    Last attempt to quit: 03/02/2008    Years since quitting: 10.6  . Smokeless tobacco: Never Used  Substance and Sexual Activity  . Alcohol use: Yes    Alcohol/week: 2.0 standard drinks    Types: 2 Standard drinks or equivalent per week    Comment: occational  . Drug use: No  . Sexual activity: Never  Lifestyle  . Physical activity:    Days per week: Not on file    Minutes per session:  Not on file  . Stress: Not on file  Relationships  . Social connections:    Talks on phone: Not on file    Gets together: Not on file    Attends religious service: Not on file    Active member of club or organization: Not on file    Attends meetings of clubs or organizations: Not on file    Relationship status: Not on file  Other Topics Concern  . Not on file  Social History Narrative  . Not on file   Depression screen [facility] 2/9 11/08/2018 08/18/2018 07/17/2018 07/03/2018 06/29/2018  Decreased Interest 0 0 0 1 0  Down, Depressed, Hopeless 0 0 0 1 0  PHQ - 2 Score 0 0 0 2 0  Altered sleeping - - - 0 -  Tired, decreased energy - - - 0 -  Change in appetite - - - 0 -  Feeling bad or failure about yourself  - - - 1 -  Trouble concentrating - - - 0 -  Moving slowly or fidgety/restless - - - 0 -  Suicidal thoughts - - - 0 -  PHQ-9 Score - - - 3 -  Difficult doing work/chores - - - Not difficult at all -    ROS As noted in HPI  Objective:  BP 130/74   Pulse 64   Temp 98 F (36.7 C) (Oral)   Resp 16   Ht 5' 4.96" (1.65 m)   Wt 158 lb (71.7 kg)   SpO2 96%   BMI 26.32 kg/m  Physical Exam Constitutional:      General: She is not in acute distress.    Appearance: She is well-developed. She is not diaphoretic.  HENT:     Head: Normocephalic and atraumatic.     Right Ear: External ear normal.     Left Ear: External ear normal.  Eyes:     General: No scleral icterus.    Conjunctiva/sclera: Conjunctivae normal.  Neck:     Musculoskeletal: Normal range of motion and neck supple.     Thyroid: No thyromegaly.  Cardiovascular:     Rate and Rhythm: Normal rate and regular rhythm.     Heart sounds: Normal heart sounds.  Pulmonary:     Effort: Pulmonary effort is normal. No respiratory distress.     Breath sounds: Normal breath sounds.  Lymphadenopathy:     Cervical: No cervical adenopathy.  Skin:    General: Skin is warm and dry.     Findings: No erythema.  Neurological:      Mental Status: She is alert and oriented to person, place, and time.  Psychiatric:        Behavior: Behavior normal.     [place] Office Visit on 11/08/2018  Component Date Value Ref Range Status  . Color, UA 11/08/2018 yellow  yellow Final  . Clarity, UA 11/08/2018 clear  clear Final  . Glucose, UA 11/08/2018 negative  negative mg/dL Final  . Bilirubin, UA 11/08/2018 negative  negative Final  . Ketones, POC UA 11/08/2018 negative  negative mg/dL Final  . Spec Grav, UA 11/08/2018 1.025  1.010 - 1.025 Final  . Blood, UA 11/08/2018 trace-lysed* negative Final  . pH, UA 11/08/2018 5.0  5.0 - 8.0 Final  . Protein Ur, POC 11/08/2018 negative  negative mg/dL Final  . Urobilinogen, UA 11/08/2018 0.2  0.2 or 1.0 E.U./dL Final  . Nitrite, UA 11/08/2018 Negative  Negative Final  . Leukocytes, UA 11/08/2018 Negative  Negative Final  . Hemoglobin A1C 11/08/2018 5.6  4.0 - 5.6 % Final  . Glucose 11/08/2018 94  65 - 99 mg/dL Final  . BUN 11/08/2018 13  8 - 27 mg/dL Final  . Creatinine, Ser 11/08/2018 0.76  0.57 - 1.00 mg/dL Final  . GFR calc non Af Amer 11/08/2018 81  >59 mL/min/1.73 Final  . GFR calc Af Amer 11/08/2018 93  >59 mL/min/1.73 Final  . BUN/Creatinine Ratio 11/08/2018 17  12 - 28 Final  . Sodium 11/08/2018 143  134 - 144 mmol/L Final  . Potassium 11/08/2018 4.4  3.5 - 5.2 mmol/L Final  . Chloride 11/08/2018 106  96 - 106 mmol/L Final  . CO2 11/08/2018 23  20 - 29 mmol/L Final  . Calcium 11/08/2018 9.5  8.7 - 10.3 mg/dL Final  . Total Protein 11/08/2018 6.8  6.0 - 8.5 g/dL Final  . Albumin 11/08/2018 4.3  3.6 - 4.8 g/dL Final  . Globulin, Total 11/08/2018 2.5  1.5 - 4.5 g/dL Final  . Albumin/Globulin Ratio 11/08/2018 1.7  1.2 - 2.2 Final  . Bilirubin Total 11/08/2018 0.4  0.0 - 1.2 mg/dL Final  . Alkaline Phosphatase 11/08/2018 68  39 - 117 IU/L Final  . AST 11/08/2018 17  0 - 40 IU/L Final  . ALT 11/08/2018 21  0 - 32 IU/L Final  . WBC,UR,HPF,POC 11/08/2018 None  None  WBC/hpf Final  . RBC,UR,HPF,POC 11/08/2018 None  None RBC/hpf Final  . Bacteria 11/08/2018 None  None, Too numerous to count Final  . Mucus 11/08/2018 Absent  Absent Final  . Epithelial Cells, UR Per Microscopy 11/08/2018 Few* None, Too numerous to count cells/hpf Final  . WBC 11/08/2018 WILL FOLLOW   Preliminary  . RBC 11/08/2018 WILL FOLLOW   Preliminary  . Hemoglobin 11/08/2018 WILL FOLLOW   Preliminary  . Hematocrit 11/08/2018 WILL FOLLOW   Preliminary  . MCV 11/08/2018 WILL FOLLOW   Preliminary  . [facility] 11/08/2018 WILL FOLLOW   Preliminary  . MCHC 11/08/2018 WILL FOLLOW   Preliminary  . RDW 11/08/2018 WILL FOLLOW   Preliminary  . Platelets 11/08/2018 WILL FOLLOW   Preliminary  . Neutrophils 11/08/2018 WILL FOLLOW   Preliminary  . Lymphs 11/08/2018 WILL FOLLOW   Preliminary  . Monocytes 11/08/2018 WILL FOLLOW   Preliminary  . Eos 11/08/2018 WILL FOLLOW   Preliminary  . Basos 11/08/2018  WILL FOLLOW   Preliminary  . Neutrophils Absolute 11/08/2018 WILL FOLLOW   Preliminary  . Lymphocytes Absolute 11/08/2018 WILL FOLLOW   Preliminary  . Monocytes Absolute 11/08/2018 WILL FOLLOW   Preliminary  . EOS (ABSOLUTE) 11/08/2018 WILL FOLLOW   Preliminary  . Basophils Absolute 11/08/2018 WILL FOLLOW   Preliminary  . Immature Granulocytes 11/08/2018 WILL FOLLOW   Preliminary  . Immature Grans (Abs) 11/08/2018 WILL FOLLOW   Preliminary    Dg Finger Thumb Right  Result Date: 11/08/2018 CLINICAL DATA:  Pain and swelling at IP joint. EXAM: RIGHT THUMB 2+V COMPARISON:  None FINDINGS: No evidence for acute fracture or dislocation. Mild degenerative changes at the MCP and IP joint. The soft tissues are unremarkable. IMPRESSION: 1. No acute findings. 2. Mild degenerative changes. Electronically Signed   By: Kerby Moors M.D.   On: 11/08/2018 12:48     Assessment & Plan:   1. Microscopic hematuria   2. Recurrent UTI   3. HYPERCHOLESTEROLEMIA   4. STRESS INCONTINENCE   5. Elevated blood sugar     6. Thumb pain, right   7. Arthralgia, unspecified joint   8. Gastroesophageal reflux disease with esophagitis   9. Need for prophylactic vaccination and inoculation against influenza     Patient will continue on current chronic medications other than changes noted above, so ok to refill when needed.   See after visit summary for patient specific instructions.  Orders Placed This Encounter  Procedures  . DG Finger Thumb Right    Standing Status:   Future    Number of Occurrences:   1    Standing Expiration Date:   01/10/2020    Order Specific Question:   Reason for Exam (SYMPTOM  OR DIAGNOSIS REQUIRED)    Answer:   swelling and pain at IP joint after crush inj years prior that was never evaluated    Order Specific Question:   Preferred imaging location?    Answer:   External  . Flu vaccine HIGH DOSE PF (Fluzone High dose)  . Comprehensive metabolic panel  . Specimen Status  . POCT urinalysis dipstick  . POCT glycosylated hemoglobin (Hb A1C)  . POCT Microscopic Urinalysis ([facility])    Meds ordered this encounter  Medications  . Olopatadine HCl 0.2 % SOLN    Sig: Apply 1 drop to eye daily.    Dispense:  1 Bottle    Refill:  3  . diltiazem (CARDIZEM) 30 MG tablet    Sig: TAKE 1 TABLET BY MOUTH AS NEEDED FOR PALPITATIONS. MAX DOSE 4 TABLETS PER 24HOUR PERIOD    Dispense:  120 tablet    Refill:  1  . ibuprofen (ADVIL,MOTRIN) 800 MG tablet    Sig: Take 1 tablet (800 mg total) by mouth every 8 (eight) hours as needed.    Dispense:  270 tablet    Refill:  0  . pantoprazole (PROTONIX) 40 MG tablet    Sig: Take 1 tablet (40 mg total) by mouth daily.    Dispense:  90 tablet    Refill:  3  . pravastatin (PRAVACHOL) 40 MG tablet    Sig: Take 1 tablet (40 mg total) by mouth daily.    Dispense:  90 tablet    Refill:  3    Patient verbalized to me that they understand the following: diagnosis, what is being done for them, what to expect and what should be done at home.  Their  questions have been answered. They understand that I am  unable to predict every possible medication interaction or adverse outcome and that if any unexpected symptoms arise, they should contact us and their pharmacist, as well as never hesitate to seek urgent/emergent care at [facility] or ER if they think it might be warranted.    Delman Cheadle, MD, MPH [facility] [place] [address] [place], [place]  [address] [phone] Office phone  [phone] Office fax  11/08/18 11:20 AM

## 2018-11-08 NOTE — Patient Instructions (Addendum)
  Start artificial tears - 3-4x/day - a good brand is SYSTANE.  If you still need an oral allergy medicine, consider trying fexofenadine (Allegra) since the cetrizine (zyrtec) is making you sleepy but lets see if the stronger olopatadine eye drop does the trick by itself   If you have lab work done today you will be contacted with your lab results within the next 2 weeks.  If you have not heard from [place] then please contact us. The fastest way to get your results is to register for My Chart.   IF you received an x-ray today, you will receive an invoice from [facility]. Please contact [facility] at [phone] with questions or concerns regarding your invoice.   IF you received labwork today, you will receive an invoice from [facility]. Please contact [facility] at [phone] with questions or concerns regarding your invoice.   Our billing staff will not be able to assist you with questions regarding bills from these companies.  You will be contacted with the lab results as soon as they are available. The fastest way to get your results is to activate your My Chart account. Instructions are located on the last page of this paperwork. If you have not heard from [place] regarding the results in 2 weeks, please contact this office.

## 2018-11-09 LAB — COMPREHENSIVE METABOLIC PANEL
ALK PHOS: 68 IU/L (ref 39–117)
ALT: 21 IU/L (ref 0–32)
AST: 17 IU/L (ref 0–40)
Albumin/Globulin Ratio: 1.7 (ref 1.2–2.2)
Albumin: 4.3 g/dL (ref 3.6–4.8)
BUN/Creatinine Ratio: 17 (ref 12–28)
BUN: 13 mg/dL (ref 8–27)
Bilirubin Total: 0.4 mg/dL (ref 0.0–1.2)
CO2: 23 mmol/L (ref 20–29)
Calcium: 9.5 mg/dL (ref 8.7–10.3)
Chloride: 106 mmol/L (ref 96–106)
Creatinine, Ser: 0.76 mg/dL (ref 0.57–1.00)
GFR calc Af Amer: 93 mL/min/{1.73_m2} (ref >59)
GFR calc non Af Amer: 81 mL/min/{1.73_m2} (ref >59)
GLOBULIN, TOTAL: 2.5 g/dL (ref 1.5–4.5)
Glucose: 94 mg/dL (ref 65–99)
Potassium: 4.4 mmol/L (ref 3.5–5.2)
SODIUM: 143 mmol/L (ref 134–144)
Total Protein: 6.8 g/dL (ref 6.0–8.5)

## 2018-11-09 LAB — SPECIMEN STATUS

## 2018-11-16 ENCOUNTER — Encounter: Payer: Self-pay | Admitting: Family Medicine

## 2018-11-28 ENCOUNTER — Encounter: Payer: Self-pay | Admitting: *Deleted

## 2019-01-01 ENCOUNTER — Encounter: Payer: Self-pay | Admitting: Family Medicine

## 2019-04-16 DIAGNOSIS — I471 Supraventricular tachycardia: Secondary | ICD-10-CM | POA: Insufficient documentation

## 2020-07-14 ENCOUNTER — Other Ambulatory Visit: Payer: Self-pay

## 2020-07-14 ENCOUNTER — Ambulatory Visit: Payer: Medicare HMO | Admitting: Cardiology

## 2020-07-14 ENCOUNTER — Encounter: Payer: Self-pay | Admitting: Cardiology

## 2020-07-14 VITALS — BP 130/66 | HR 79 | Resp 17 | Ht 64.0 in | Wt 158.0 lb

## 2020-07-14 DIAGNOSIS — R002 Palpitations: Secondary | ICD-10-CM

## 2020-07-14 NOTE — Patient Instructions (Signed)
Taquicardia supraventricular en los adultos Supraventricular Tachycardia, Adult La taquicardia supraventricular (TSV) es un tipo de ritmo cardaco anormal. Hace que el corazn lata con demasiada rapidez y luego regrese a la velocidad normal. Una frecuencia cardaca normal en reposo es de entre 34 y 100latidos por minuto. Durante un episodio de TSV, la frecuencia cardaca puede superar los 150latidos por minuto. Los episodios de TSV pueden provocar temor, pero por lo general no son peligrosos. Sin embargo, si los episodios ocurren varias veces por da o duran ms de algunos segundos, pueden derivar en insuficiencia cardaca. Cules son las causas?  Por lo general, un latido cardaco normal comienza cuando una zona llamada ndulo sinoauricular libera una Therapist, nutritional. En casos de TSV, otras zonas del corazn envan seales elctricas que interfieren la seal del ndulo sinoauricular. Se desconoce el motivo por el cual algunas personas tienen TSV y otras no. Qu incrementa el riesgo? Es ms probable que tenga esta afeccin si usted:  Tiene entre 12 y 53aos.  Es mujer. Los siguientes factores pueden hacer que sea ms propenso a Armed forces training and education officer afeccin:  Psychologist, forensic.  Cansancio.  Fumar.  Estimulantes, como la cocana y Sales executive.  El alcohol.  La cafena.  Embarazo.  Ansiedad. Cules son los signos o los sntomas? Los sntomas de esta afeccin incluyen:  Palpitaciones.  Sensacin de que el corazn se saltea latidos (palpitaciones).  Debilidad.  Falta de aire.  Opresin o dolor en el pecho.  Desvanecimiento.  Ansiedad.  Mareos.  Sudoracin.  Nuseas.  Desmayos.  Fatiga o cansancio. Un episodio leve tal vez no produzca sntomas. Cmo se diagnostica? Esta afeccin se puede diagnosticar en funcin de lo siguiente:  Sus sntomas.  Un examen fsico. ? Si tiene un episodio de TSV durante el examen, el mdico puede diagnosticarle TSV al escuchar el  corazn y sentir el pulso.  Estudios. Estos pueden incluir lo siguiente: ? Electrocardiograma (ECG). Esta prueba se realiza para detectar problemas con la actividad elctrica del corazn. ? Una prueba con monitor Holter o monitor de eventos. Esta prueba consiste en usar un dispositivo porttil que controla la frecuencia cardaca con el tiempo. ? Un ecocardiograma. Esta prueba consiste en tomar una imagen del corazn empleando ondas de sonido. Se realiza para descargar otras causas de una frecuencia cardaca acelerada. ? Anlisis de [place]. Cmo se trata? El tratamiento de esta afeccin puede incluir:  Estimulacin del nervio vago. El Paediatric nurse en estimular el nervio vago, que desacelera el corazn. Con frecuencia es Software engineer y nico tratamiento necesario para esta afeccin. Trabaje en colaboracin con su mdico para hallar el tratamiento ms adecuado para usted. Entre las formas de Optometrist este tratamiento, se incluyen las siguientes: ? Consulting civil engineer respiracin y [place], como si estuviera defecando. ? Masajear una zona de un lado del cuello, debajo de la [place]. No intente hacer esto por su cuenta. Solo un mdico debe hacer esto. Si se hace de manera incorrecta, puede provocar un accidente cerebrovascular. ? Inclinarse hacia adelante con la cabeza entre las piernas. ? Toser mientras se inclina hacia adelante con la [employer] las piernas. ? Cerrar los ojos y [employer] globos oculares. Antes de intentarlo por su cuenta, pdale a un mdico que le ensee este mtodo.  Medicamentos que previenen los episodios.  Medicamentos para Scientist, water quality los episodios. Los medicamentos se administran a travs de un catter [employer] hospital.  Un choque elctrico leve (cardioversin) que detiene el episodio. Antes de recibir el choque, le darn medicamentos para dormirlo.  Ablacin por radiofrecuencia. En este procedimiento, se [place] un tubo pequeo y delgado (catter) para enviar  energa de radiofrecuencia a la zona de tejido que provoca los latidos cardacos rpidos. La [employer] las clulas y ayuda al corazn a tener un ritmo normal. Puede recibir este tratamiento si presenta sntomas de TSV con frecuencia. Si no tiene sntomas, es posible que no necesite tratamiento. Siga estas instrucciones en su casa: Estrs  Cuando sea posible, evite las situaciones estresantes.  Encuentre maneras saludables de enfrentar el estrs que funcionen para usted. Algunas formas saludables de controlar el estrs son las siguientes: ? Arboriculturist, como yoga, meditacin o el contacto con la naturaleza. ? Escuchar msica relajante. ? Practicar tcnicas de relajacin, como la respiracin profunda. ? Llevar un estilo de vida saludable. Esto supone dormir lo suficiente, hacer ejercicio y llevar una dieta equilibrada. ? Asistir a sesiones de asesoramientopsicolgico o psicoterapia con un profesional de salud mental. Estilo de vida   Trate de dormir como mnimo 7 horas todas las noches.  No consuma ningn producto que contenga nicotina o tabaco, como cigarrillos, cigarrillos electrnicos y tabaco de Higher education careers adviser. Si necesita ayuda para dejar de fumar, consulte al mdico.  Sea consciente del efecto que el alcohol tiene en su afeccin. Si el alcohol: ? Desencadena episodios de TSV, no lo beba. ? Aparentemente no desencadena ningn episodio, limite el consumo a no ms de 66medida por da si es mujer y no est [place], y 44medidas por da si es hombre. Est atento a la cantidad de alcohol que hay en las bebidas que toma. En los [place], una medida equivale a una botella de cerveza de 12oz (317ml), un vaso de vino de 5oz (138ml) o un vaso de una bebida alcohlica de alta graduacin de 1oz (80ml).  Sea consciente del efecto que la cafena tiene en su afeccin. Si la cafena: ? Desencadena episodios de TSV, no coma, no beba ni consuma nada que contenga  cafena. ? Si la cafena aparentemente no desencadena ningn episodio, consmala con moderacin.  No consuma estimulantes. Si necesita ayuda para dejar de consumir, hable con el mdico. Instrucciones generales  Mantenga un peso saludable.  Haga ejercicio regularmente. Pdale al mdico que le sugiera algunas actividades que sean adecuadas para usted. Propngase una o una combinacin de las siguientes metas: ? Realizar 158minutos por semana de ejercicio moderado, Water engineer yoga. ? Realizar 53minutos por semana de ejercicio enrgico, como correr o nadar.  Realice la estimulacin del nervio vago como se lo haya indicado el [place] los medicamentos de venta libre y los recetados solamente como se lo haya indicado el mdico.  Consulting civil engineer a todas las visitas de seguimiento como se lo haya indicado el mdico. Esto es importante. Comunquese con un mdico si:  Tiene episodios de TSV con mayor frecuencia que antes.  Los episodios de TSV duran ms que antes.  La estimulacin del nervio vago ya no funciona.  Aparecen nuevos sntomas. Solicite ayuda inmediatamente si:  Electronics engineer.  Sus sntomas empeoran.  Tiene dificultad para respirar.  Tiene un episodio de TSV que dura ms de 8minutos.  Se desmaya. Estos sntomas pueden representar un problema grave que constituye Engineer, maintenance (IT). No espere a ver si los sntomas desaparecen. Solicite atencin mdica de inmediato. Comunquese con el servicio de emergencias de su localidad (911 en los [place]). No conduzca por sus propios medios [employer] hospital. Resumen  La taquicardia supraventricular (TSV) es un tipo  de ritmo cardaco anormal.  Durante un episodio de TSV, la frecuencia cardaca puede superar los 150latidos por minuto.  El tratamiento depende de la frecuencia con que se presenten episodios y de los sntomas experimentados. Esta informacin no tiene Marine scientist el consejo del mdico.  Asegrese de hacerle al mdico cualquier pregunta que tenga. Document Revised: 12/04/2018 Document Reviewed: 12/04/2018 Elsevier Patient Education  [place].

## 2020-07-14 NOTE — Progress Notes (Signed)
Follow up visit  Subjective:   Lisa Costa, female    DOB: 1951/04/29, 69 y.o.   MRN: [id number]   HPI  Chief Complaint  Patient presents with  . Palpitations  . Shortness of Breath  . Follow-up   69 year old Hispanic female with PSVT.  Patient usually sees Dr. Einar Gip, last seen by him in December 2019. 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 She recently had one episode of sudden onset palpitations, associated with near syncope symptoms, similar to what she has experienced in the past. Symptoms improved with diltiazem. Symptoms did not recur when she went on a vacation. She worries she may have recurrence of symptoms, now that she is back to stressful work. She denies any chest pain.   Current Outpatient Medications on File Prior to Visit  Medication Sig Dispense Refill  . aspirin EC 81 MG tablet Take 81 mg by mouth daily.    . calcium-vitamin D (OSCAL) 250-125 MG-UNIT per tablet Take 1 tablet by mouth daily. Reported on 11/25/2015    . cetirizine (ZYRTEC) 10 MG tablet Take by mouth.    . chlorhexidine (PERIDEX) 0.12 % solution   0  . Cholecalciferol (VITAMIN D3) 1000 units CAPS Take by mouth.    . diltiazem (CARDIZEM) 30 MG tablet TAKE 1 TABLET BY MOUTH AS NEEDED FOR PALPITATIONS. MAX DOSE 4 TABLETS PER 24HOUR PERIOD 120 tablet 1  . ibuprofen (ADVIL,MOTRIN) 800 MG tablet Take 1 tablet (800 mg total) by mouth every 8 (eight) hours as needed. 270 tablet 0  . meclizine (ANTIVERT) 12.5 MG tablet Take 1 tablet (12.5 mg total) by mouth 3 (three) times daily as needed for dizziness. 30 tablet 6  . Olopatadine HCl 0.2 % SOLN Apply 1 drop to eye daily. 1 Bottle 3  . pantoprazole (PROTONIX) 40 MG tablet Take 1 tablet (40 mg total) by mouth daily. 90 tablet 3  . pravastatin (PRAVACHOL) 40 MG tablet Take 1 tablet (40 mg total) by mouth daily. 90 tablet 3  . tiZANidine (ZANAFLEX) 4 MG capsule Take 4 mg by mouth 2 (two) times daily as needed.      No current facility-administered medications on file prior to visit.    Cardiovascular & other pertient studies:  EKG 07/14/2020: Sinus rhythm 73 bpm Cannot exclude anteroseptal infarct   Echocardiogram 07/2018: EF 69%.  Grade 1 diastolic dysfunction. Trace tricuspid regurgitation.  Treadmill stress test 07/2018: 7.6 METS.  No ischemic changes.   Recent labs: 06/23/2020: Glucose 101, BUN/Cr 13/0.74. EGFR 82. Na/K 140/4.3. Rest of the CMP normal H/H 13/40. MCV 96. Platelets 243 HbA1C 5.6% Chol 165, TG 86, HDL 57, LDL 89 TSH 1.6 normal    Review of Systems  Cardiovascular: Positive for palpitations. Negative for chest pain, dyspnea on exertion, leg swelling and syncope.  Neurological: Positive for light-headedness.       Vitals:   07/14/20 1514  BP: 130/66  Pulse: 79  Resp: 17  SpO2: 98%    Body mass index is 27.12 kg/m. Filed Weights   07/14/20 1514  Weight: 158 lb (71.7 kg)     Objective:   Physical Exam Vitals and nursing note reviewed.  Constitutional:      General: She is not in acute distress. Neck:     Vascular: No JVD.  Cardiovascular:     Rate and Rhythm: Normal rate and regular rhythm.     Heart sounds: Normal heart sounds. No murmur heard.   Pulmonary:  Effort: Pulmonary effort is normal.     Breath sounds: Normal breath sounds. No wheezing or rales.           Assessment & Recommendations:   69 year old Hispanic female with PSVT.  PSVT: Occasional episodes. She has not tried vagal maneuvers. Discussed vagal maneuvers at length. Patient can also use as needed diltiazem 30 mg, up to 3-4 pills/day. If her symptoms become more frequent, she knows to call our office to be placed on a telemetry monitor.    F/u w/Dr. Einar Gip in 4-6 weeks   Vernell Leep, MD

## 2020-09-01 ENCOUNTER — Ambulatory Visit: Payer: BLUE CROSS/BLUE SHIELD | Admitting: Family Medicine

## 2020-09-01 ENCOUNTER — Ambulatory Visit: Payer: Medicare HMO | Admitting: Cardiology

## 2020-09-01 ENCOUNTER — Other Ambulatory Visit: Payer: Self-pay

## 2020-09-01 ENCOUNTER — Encounter: Payer: Self-pay | Admitting: Cardiology

## 2020-09-01 VITALS — BP 130/67 | HR 69 | Resp 16 | Ht 64.0 in | Wt 156.4 lb

## 2020-09-01 DIAGNOSIS — R002 Palpitations: Secondary | ICD-10-CM

## 2020-09-01 DIAGNOSIS — R0789 Other chest pain: Secondary | ICD-10-CM

## 2020-09-01 MED ORDER — DILTIAZEM HCL 30 MG PO TABS: ORAL_TABLET | ORAL | 1 refills | Status: DC

## 2020-09-01 NOTE — Progress Notes (Signed)
Primary Physician/Referring:  Aletha Halim., PA-C  [name], female    DOB: 1951/06/22, 69 y.o.   MRN: [id number]  Chief Complaint  Patient presents with  . Follow-up    6 weeks  . PSVT   HPI:    Lisa Costa  is a 69 y.o. female with history of PSVT and hypercholesterolemia, pre-diabetes, originally referred to our office for evaluation of palpitations.   Patient presents for 6-week follow-up of palpitations.  She reports she has had 3 brief episodes of heart racing symptoms since her last visit, these were not associated with near syncope symptoms as they had been in the past.  She took diltiazem once, symptoms resolved.  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  She also noted one episode last week of nonexertional chest pain lasting less than 5 minutes.  She is active on a regular basis primarily at work, without issue.  Denies shortness of breath, orthopnea, PND, syncope, near syncope.  Of note she is leaving for [place] for [place]-mont[place]rip at the end of November.    Past Medical History:  Diagnosis Date  . Allergy   . Anxiety   . Arthritis   . Cataract    both eyes  . Depression   . GERD (gastroesophageal reflux disease)   . Heart murmur   . Hx of adenomatous polyp of colon 03/16/2018  . Hypercholesteremia   . Left radial head fracture   . Osteoporosis   . Tuberculosis    [place]had treatment for 4 months   Past Surgical History:  Procedure Laterality Date  . CESAREAN SECTION     3 births  . COLONOSCOPY    . POLYPECTOMY     Family History  Problem Relation Age of Onset  . Heart disease Mother   . Colon cancer Neg Hx   . Colon polyps Neg Hx   . Esophageal cancer Neg Hx   . Rectal cancer Neg Hx   . Stomach cancer Neg Hx     Social History   Tobacco Use  . Smoking status: Former Smoker    Types: Cigarettes    Quit date: 03/02/2008    Years  since quitting: 12.5  . Smokeless tobacco: Never Used  Substance Use Topics  . Alcohol use: Yes    Alcohol/week: 2.0 standard drinks    Types: 2 Standard drinks or equivalent per week    Comment: occational   Marital Status: Single   ROS  Review of Systems  Cardiovascular: Positive for chest pain (1 episode, non-exertional ) and palpitations (3 brief episodes ). Negative for dyspnea on exertion, leg swelling, near-syncope, orthopnea and syncope.  Respiratory: Negative for shortness of breath.   Neurological: Negative for dizziness.     Objective  Blood pressure 130/67, pulse 69, resp. rate 16, height '5\' 4"'  (1.626 m), weight 156 lb 6.4 oz (70.9 kg), SpO2 96 %.  Vitals with BMI 09/01/2020 07/14/2020 11/08/2018  Height '5\' 4"'  '5\' 4"'  5' 4.961"  Weight 156 lbs 6 oz 158 lbs 158 lbs  BMI 26.83 [id number] Systolic [id number] Diastolic 67 66 74  Pulse 69 79 64     Physical Exam Vitals reviewed.  HENT:     Head: Normocephalic and atraumatic.  Cardiovascular:     Rate and Rhythm: Normal rate and regular rhythm.     Pulses: Intact distal pulses.  Heart sounds: S1 normal and S2 normal. No murmur heard.  No gallop.   Pulmonary:     Effort: Pulmonary effort is normal. No respiratory distress.     Breath sounds: No wheezing, rhonchi or rales.  Musculoskeletal:     Right lower leg: No edema.     Left lower leg: No edema.  Neurological:     Mental Status: She is alert.    Laboratory examination:   No results for input(s): NA, K, CL, CO2, GLUCOSE, BUN, CREATININE, CALCIUM, GFRNONAA, GFRAA in the last 8760 hours. CrCl cannot be calculated (Patient's most recent lab result is older than the maximum 21 days allowed.).  CMP Latest Ref Rng & Units 11/08/2018 06/29/2018 01/07/2018  Glucose 65 - 99 mg/dL 94 88 89  BUN 8 - 27 mg/dL '13 16 12  ' Creatinine 0.57 - 1.00 mg/dL 0.76 0.76 0.67  Sodium 134 - 144 mmol/L 143 142 142  Potassium 3.5 - 5.2 mmol/L 4.4 4.6 4.7  Chloride 96 - 106 mmol/L  106 104 103  CO2 20 - 29 mmol/L '23 22 23  ' Calcium 8.7 - 10.3 mg/dL 9.5 9.9 9.6  Total Protein 6.0 - 8.5 g/dL 6.8 7.0 7.2  Total Bilirubin 0.0 - 1.2 mg/dL 0.4 0.2 0.3  Alkaline Phos 39 - 117 IU/L 68 62 67  AST 0 - 40 IU/L '17 21 15  ' ALT 0 - 32 IU/L '21 21 17   ' CBC 11/08/2018 06/29/2018 01/07/2018  WBC WILL FOLLOW 6.3 5.3  Hemoglobin WILL FOLLOW 13.7 14.1  Hematocrit WILL FOLLOW 41.3 42.3  Platelets WILL FOLLOW 290 269    Lipid Panel No results for input(s): CHOL, TRIG, LDLCALC, VLDL, HDL, CHOLHDL, LDLDIRECT in the last 8760 hours.  HEMOGLOBIN A1C Lab Results  Component Value Date   HGBA1C 5.6 11/08/2018   TSH No results for input(s): TSH in the last 8760 hours.  External labs:   06/23/2020: Glucose 101, BUN/Cr 13/0.74. EGFR 82. Na/K 140/4.3. Rest of the CMP normal H/H 13/40. MCV 96. Platelets 243 HbA1C 5.6% Chol 165, TG 86, HDL 57, LDL 89 TSH 1.6 normal  Medications and allergies   Allergies  Allergen Reactions  . Tramadol Nausea And Vomiting     Outpatient Medications Prior to Visit  Medication Sig Dispense Refill  . calcium-vitamin D (OSCAL) 250-125 MG-UNIT per tablet Take 1 tablet by mouth daily. Reported on 11/25/2015    . cetirizine (ZYRTEC) 10 MG tablet Take by mouth.    . chlorhexidine (PERIDEX) 0.12 % solution   0  . Cholecalciferol (VITAMIN D3) 1000 units CAPS Take by mouth.    Marland Kitchen ibuprofen (ADVIL,MOTRIN) 800 MG tablet Take 1 tablet (800 mg total) by mouth every 8 (eight) hours as needed. 270 tablet 0  . meclizine (ANTIVERT) 12.5 MG tablet Take 1 tablet (12.5 mg total) by mouth 3 (three) times daily as needed for dizziness. 30 tablet 6  . Olopatadine HCl 0.2 % SOLN Apply 1 drop to eye daily. 1 Bottle 3  . pantoprazole (PROTONIX) 40 MG tablet Take 1 tablet (40 mg total) by mouth daily. 90 tablet 3  . pravastatin (PRAVACHOL) 40 MG tablet Take 1 tablet (40 mg total) by mouth daily. 90 tablet 3  . tiZANidine (ZANAFLEX) 4 MG capsule Take 4 mg by mouth 2 (two) times  daily as needed.    . diltiazem (CARDIZEM) 30 MG tablet TAKE 1 TABLET BY MOUTH AS NEEDED FOR PALPITATIONS. MAX DOSE 4 TABLETS PER 24HOUR PERIOD 120 tablet 1   No facility-administered  medications prior to visit.    Radiology:   No results found.  Cardiac Studies:   Echocardiogram 07/2018: EF 69%.  Grade 1 diastolic dysfunction. Trace tricuspid regurgitation.  Treadmill stress test 07/2018: 7.6 METS.  No ischemic changes.  EKG:   EKG 07/14/2020: Sinus rhythm 73 bpm. Cannot exclude anteroseptal infarct   Assessment     ICD-10-CM   1. Palpitations  R00.2 diltiazem (CARDIZEM) 30 MG tablet  2. Chest pain, non-cardiac  R07.89      Medications Discontinued During This Encounter  Medication Reason  . diltiazem (CARDIZEM) 30 MG tablet Reorder    Meds ordered this encounter  Medications  . diltiazem (CARDIZEM) 30 MG tablet    Sig: TAKE 1 TABLET BY MOUTH AS NEEDED FOR PALPITATIONS. MAX DOSE 4 TABLETS PER 24HOUR PERIOD    Dispense:  120 tablet    Refill:  1    Recommendations:   Lisa Costa is a 69 y.o. female with history of PSVT and hypercholesterolemia, originally referred to our office for evaluation of palpitations.   Patient presents for 6-week follow-up of palpitations.  Symptoms have significantly improved, reports only 3 brief episodes of palpitations only requiring her to take diltiazem 30 mg once, which resolved symptoms.  His palpitations are no longer associated with symptoms of shortness of breath or near syncope.  Encourage patient to continue to take diltiazem 30 mg as needed for palpitations, up to 3 to 4 pills/day. At list time telemetry is not necessary as symptom frequency and severity have greatly improved. Encourage patient to notify our office if symptoms worsen.   Patient also noted 1 episode of brief nonexertional chest pain.  In view of characteristics of this episode, normal stress test in 2019, and minimal cardiac risk factors suspect this  episode of chest pain to be non-cardiac in origin. May be related to musculoskeletal pain as her job is physically taxing. Encourage patient to continue to monitor this and to notify the office if it becomes more frequent, lasts longer, happens with exertion, she expressed understanding.   Follow up in 6 months for palpitations.   Patient was seen in collaboration with Dr. Einar Gip. He also reviewed patient's chart and Dr. Einar Gip is in agreement of the plan.     Alethia Berthold, PA-C 09/01/2020, 11:00 AM Office: [phone]

## 2020-10-20 ENCOUNTER — Encounter: Payer: Self-pay | Admitting: Family Medicine

## 2021-01-19 DIAGNOSIS — I471 Supraventricular tachycardia: Secondary | ICD-10-CM | POA: Diagnosis not present

## 2021-01-19 DIAGNOSIS — Z0000 Encounter for general adult medical examination without abnormal findings: Secondary | ICD-10-CM | POA: Diagnosis not present

## 2021-01-19 DIAGNOSIS — R739 Hyperglycemia, unspecified: Secondary | ICD-10-CM | POA: Diagnosis not present

## 2021-01-19 DIAGNOSIS — E7800 Pure hypercholesterolemia, unspecified: Secondary | ICD-10-CM | POA: Diagnosis not present

## 2021-01-19 DIAGNOSIS — K219 Gastro-esophageal reflux disease without esophagitis: Secondary | ICD-10-CM | POA: Diagnosis not present

## 2021-01-19 DIAGNOSIS — L309 Dermatitis, unspecified: Secondary | ICD-10-CM | POA: Diagnosis not present

## 2021-01-19 DIAGNOSIS — Z87891 Personal history of nicotine dependence: Secondary | ICD-10-CM | POA: Diagnosis not present

## 2021-01-19 DIAGNOSIS — Z23 Encounter for immunization: Secondary | ICD-10-CM | POA: Diagnosis not present

## 2021-01-20 DIAGNOSIS — Z1231 Encounter for screening mammogram for malignant neoplasm of breast: Secondary | ICD-10-CM | POA: Diagnosis not present

## 2021-01-26 DIAGNOSIS — H25813 Combined forms of age-related cataract, bilateral: Secondary | ICD-10-CM | POA: Diagnosis not present

## 2021-02-09 ENCOUNTER — Other Ambulatory Visit: Payer: Self-pay

## 2021-02-09 ENCOUNTER — Encounter: Payer: Self-pay | Admitting: Family Medicine

## 2021-02-09 ENCOUNTER — Ambulatory Visit (INDEPENDENT_AMBULATORY_CARE_PROVIDER_SITE_OTHER): Payer: Medicare HMO | Admitting: Family Medicine

## 2021-02-09 VITALS — BP 116/68 | HR 69 | Temp 98.0°F | Ht 64.0 in | Wt 162.0 lb

## 2021-02-09 DIAGNOSIS — K219 Gastro-esophageal reflux disease without esophagitis: Secondary | ICD-10-CM | POA: Diagnosis not present

## 2021-02-09 DIAGNOSIS — F341 Dysthymic disorder: Secondary | ICD-10-CM

## 2021-02-09 DIAGNOSIS — E7800 Pure hypercholesterolemia, unspecified: Secondary | ICD-10-CM | POA: Diagnosis not present

## 2021-02-09 DIAGNOSIS — N390 Urinary tract infection, site not specified: Secondary | ICD-10-CM

## 2021-02-09 DIAGNOSIS — R69 Illness, unspecified: Secondary | ICD-10-CM | POA: Diagnosis not present

## 2021-02-09 DIAGNOSIS — R7303 Prediabetes: Secondary | ICD-10-CM

## 2021-02-09 LAB — POCT URINALYSIS DIP (MANUAL ENTRY)
Glucose, UA: NEGATIVE mg/dL
Ketones, POC UA: NEGATIVE mg/dL
Leukocytes, UA: NEGATIVE
Nitrite, UA: NEGATIVE
Protein Ur, POC: NEGATIVE mg/dL
Spec Grav, UA: 1.025 (ref 1.010–1.025)
Urobilinogen, UA: 0.2 E.U./dL
pH, UA: 5 (ref 5.0–8.0)

## 2021-02-09 MED ORDER — PRAVASTATIN SODIUM 40 MG PO TABS: 60.0000 mg | ORAL_TABLET | Freq: Every day | ORAL | 3 refills | Status: DC

## 2021-02-09 NOTE — Progress Notes (Signed)
3/14/202211:02 AM  Lisa Costa 13-Mar-1951, 70 y.o., female [id number]  Chief Complaint  Patient presents with  . Dizziness    When bending over   . request for psychologist referral   . cholesterol and A1c recent check     HPI:   Patient is a 70 y.o. female with past medical history significant for HLD, GERD who presents today for chronic care follow up.  01/19/21 A1c: 5.9 Total Cholesterol: 195 Trig: 101 HDL 59 LDL 115 Potassium 4.2 Liver WNL Kidney WNL  Had a mammogram 2 weeks ago, per patient was negative at [facility] Tdap and shingrix vaccine  Health Maintenance  Topic Date Due  . MAMMOGRAM  01/01/2021  . TETANUS/TDAP  01/28/2021  . COLONOSCOPY (Pts 45-48yrs Insurance coverage will need to be confirmed)  03/16/2028  . INFLUENZA VACCINE  Completed  . DEXA SCAN  Completed  . COVID-19 Vaccine  Completed  . Hepatitis C Screening  Completed  . PNA vac Low Risk Adult  Completed  . HPV VACCINES  Aged Out     Depression screen [facility] 2/9 02/09/2021 11/08/2018 08/18/2018  Decreased Interest 1 0 0  Down, Depressed, Hopeless 1 0 0  PHQ - 2 Score 2 0 0  Altered sleeping 1 - -  Tired, decreased energy 1 - -  Change in appetite 0 - -  Feeling bad or failure about yourself  0 - -  Trouble concentrating 0 - -  Moving slowly or fidgety/restless 0 - -  Suicidal thoughts 0 - -  PHQ-9 Score 4 - -  Difficult doing work/chores Somewhat difficult - -    Fall Risk  02/09/2021 11/08/2018 08/18/2018 07/17/2018 07/03/2018  Falls in the past year? 0 0 No No No  Number falls in past yr: 0 0 - - -  Injury with Fall? 0 0 - - -  Follow up Falls evaluation completed - - - -     Allergies  Allergen Reactions  . Tramadol Nausea And Vomiting  . Other     Prior to Admission medications   Medication Sig Start Date End Date Taking? Authorizing Provider  calcium-vitamin D (OSCAL) 250-125 MG-UNIT per tablet Take 1 tablet by mouth daily. Reported on 11/25/2015     [provider]  cetirizine (ZYRTEC) 10 MG tablet Take by mouth.    [provider]  chlorhexidine (PERIDEX) 0.12 % solution  10/17/18   [provider]  Cholecalciferol (VITAMIN D3) 1000 units CAPS Take by mouth.    [provider]  diltiazem (CARDIZEM) 30 MG tablet TAKE 1 TABLET BY MOUTH AS NEEDED FOR PALPITATIONS. MAX DOSE 4 TABLETS PER 24HOUR PERIOD 09/01/20   Cantwell, Celeste C, PA-C  ibuprofen (ADVIL,MOTRIN) 800 MG tablet Take 1 tablet (800 mg total) by mouth every 8 (eight) hours as needed. 11/08/18   Shawnee Knapp, MD  meclizine (ANTIVERT) 12.5 MG tablet Take 1 tablet (12.5 mg total) by mouth 3 (three) times daily as needed for dizziness. 05/21/17   Shawnee Knapp, MD  Olopatadine HCl 0.2 % SOLN Apply 1 drop to eye daily. 11/08/18   Shawnee Knapp, MD  pantoprazole (PROTONIX) 40 MG tablet Take 1 tablet (40 mg total) by mouth daily. 11/08/18   Shawnee Knapp, MD  pravastatin (PRAVACHOL) 40 MG tablet Take 1 tablet (40 mg total) by mouth daily. 11/08/18   Shawnee Knapp, MD  tiZANidine (ZANAFLEX) 4 MG capsule Take 4 mg by mouth 2 (two) times daily as needed.  06/23/20   [provider]    Past Medical History:  Diagnosis Date  . Allergy   . Anxiety   . Arthritis   . Cataract    both eyes  . Depression   . Depression    [employer] 02/06/2021  . GERD (gastroesophageal reflux disease)   . Heart murmur   . Hx of adenomatous polyp of colon 03/16/2018  . Hypercholesteremia   . Hyperlipidemia    [employer] 02/06/2021  . Left radial head fracture   . Osteoporosis   . Osteoporosis    [employer] 02/06/2021  . Tuberculosis    vaccine in [place] tests positive each time/had treatment for 4 months    Past Surgical History:  Procedure Laterality Date  . CESAREAN SECTION     3 births  . COLONOSCOPY    . POLYPECTOMY      Social History   Tobacco Use  . Smoking status: Former Smoker    Types: Cigarettes    Quit date: 03/02/2008    Years since quitting:  12.9  . Smokeless tobacco: Never Used  Substance Use Topics  . Alcohol use: Yes    Alcohol/week: 2.0 standard drinks    Types: 2 Standard drinks or equivalent per week    Comment: occational    Family History  Problem Relation Age of Onset  . Heart disease Mother   . Colon cancer Neg Hx   . Colon polyps Neg Hx   . Esophageal cancer Neg Hx   . Rectal cancer Neg Hx   . Stomach cancer Neg Hx     Review of Systems  Constitutional: Negative for chills, fever and malaise/fatigue.  Eyes: Negative for blurred vision and double vision.  Respiratory: Negative for cough, shortness of breath and wheezing.   Cardiovascular: Negative for chest pain, palpitations and leg swelling.  Gastrointestinal: Negative for abdominal pain, blood in stool, constipation, diarrhea, heartburn, nausea and vomiting.  Genitourinary: Negative for dysuria, flank pain, frequency, hematuria and urgency.       History of frequent UTIs, no symptoms right now, would like checked  Musculoskeletal: Negative for back pain and joint pain.  Skin: Negative for rash.  Neurological: Positive for dizziness (Intermittent with position changes). Negative for weakness and headaches.  Psychiatric/Behavioral: The patient is nervous/anxious (Has been going on for awhile, was on medication in the past, doesn't want to be on medication).      OBJECTIVE:  Today's Vitals   02/09/21 0958  BP: 116/68  Pulse: 69  Temp: 98 F (36.7 C)  SpO2: 97%  Weight: 162 lb (73.5 kg)  Height: 5\' 4"  (1.626 m)   Body mass index is 27.81 kg/m.   Physical Exam Constitutional:      General: She is not in acute distress.    Appearance: Normal appearance. She is not ill-appearing.  HENT:     Head: Normocephalic.     Right Ear: Tympanic membrane, ear canal and external ear normal. There is no impacted cerumen.     Left Ear: Tympanic membrane, ear canal and external ear normal. There is no impacted cerumen.  Cardiovascular:     Rate and  Rhythm: Normal rate and regular rhythm.     Pulses: Normal pulses.     Heart sounds: Normal heart sounds. No murmur heard. No friction rub. No gallop.   Pulmonary:     Effort: Pulmonary effort is normal. No respiratory distress.     Breath sounds: Normal breath sounds. No stridor. No wheezing, rhonchi or  rales.  Abdominal:     General: Bowel sounds are normal.     Palpations: Abdomen is soft.     Tenderness: There is no abdominal tenderness.  Musculoskeletal:     Right lower leg: No edema.     Left lower leg: No edema.  Skin:    General: Skin is warm and dry.  Neurological:     General: No focal deficit present.     Mental Status: She is alert and oriented to person, place, and time. Mental status is at baseline.     Sensory: No sensory deficit.     Motor: No weakness.     Coordination: Coordination normal.     Gait: Gait normal.  Psychiatric:        Mood and Affect: Mood normal.        Behavior: Behavior normal.     Results for orders placed or performed in visit on 02/09/21 (from the past 24 hour(s))  POCT urinalysis dipstick     Status: Abnormal   Collection Time: 02/09/21 10:43 AM  Result Value Ref Range   Color, UA yellow yellow   Clarity, UA clear clear   Glucose, UA negative negative mg/dL   Bilirubin, UA small (A) negative   Ketones, POC UA negative negative mg/dL   Spec Grav, UA 1.025 1.010 - 1.025   Blood, UA trace-intact (A) negative   pH, UA 5.0 5.0 - 8.0   Protein Ur, POC negative negative mg/dL   Urobilinogen, UA 0.2 0.2 or 1.0 E.U./dL   Nitrite, UA Negative Negative   Leukocytes, UA Negative Negative    No results found.   ASSESSMENT and PLAN  Problem List Items Addressed This Visit      Digestive   GERD     Genitourinary   Recurrent UTI   Relevant Orders   POCT urinalysis dipstick (Completed)   Urine Culture     Other   HYPERCHOLESTEROLEMIA   Relevant Medications   aspirin EC 81 MG tablet   pravastatin (PRAVACHOL) 40 MG tablet    ANXIETY DEPRESSION   Relevant Orders   Ambulatory referral to Psychiatry    Other Visit Diagnoses    Pre-diabetes    -  Primary      Plan . Increased pravastatin from 40 to 60 mg due to LDL> 100 . Referral for psychiatry placed and contact info for counseling provided . Will follow up with lab results.   Return in about 6 months (around 08/12/2021).    Lisa Foley Asencion Guisinger, FNP-BC [facility] [place], [place] [address] Ph.  [phone] Fax [phone]

## 2021-02-09 NOTE — Patient Instructions (Addendum)
[facility] next time at [place] will get a call from psychiatry  For therapy -- 1. [facility] ([place], [place], [place]) [phone] 2. [facility] [place]) [phone] 3. [facility] - [phone] 4. [facility] - [phone] (do not file insurance) 5. [facility] accepts most major insurances. [phone] or email [email or web address]    Health Maintenance After Age 69 After age 29, you are at a higher risk for certain long-term diseases and infections as well as injuries from falls. Falls are a major cause of broken bones and head injuries in people who are older than age 57. Getting regular preventive care can help to keep you healthy and well. Preventive care includes getting regular testing and making lifestyle changes as recommended by your health care provider. Talk with your health care provider about:  Which screenings and tests you should have. A screening is a test that checks for a disease when you have no symptoms.  A diet and exercise plan that is right for you. What should I know about screenings and tests to prevent falls? Screening and testing are the best ways to find a health problem early. Early diagnosis and treatment give you the best chance of managing medical conditions that are common after age 71. Certain conditions and lifestyle choices may make you more likely to have a fall. Your health care provider may recommend:  Regular vision checks. Poor vision and conditions such as cataracts can make you more likely to have a fall. If you wear glasses, make sure to get your prescription updated if your vision changes.  Medicine review. Work with your health care provider to regularly review all of the medicines you are taking, including over-the-counter medicines. Ask your health care provider  about any side effects that may make you more likely to have a fall. Tell your health care provider if any medicines that you take make you feel dizzy or sleepy.  Osteoporosis screening. Osteoporosis is a condition that causes the bones to get weaker. This can make the bones weak and cause them to break more easily.  Blood pressure screening. Blood pressure changes and medicines to control blood pressure can make you feel dizzy.  Strength and balance checks. Your health care provider may recommend certain tests to check your strength and balance while standing, walking, or changing positions.  Foot health exam. Foot pain and numbness, as well as not wearing proper footwear, can make you more likely to have a fall.  Depression screening. You may be more likely to have a fall if you have a fear of falling, feel emotionally low, or feel unable to do activities that you used to do.  Alcohol use screening. Using too much alcohol can affect your balance and may make you more likely to have a fall. What actions can I take to lower my risk of falls? General instructions  Talk with your health care provider about your risks for falling. Tell your health care provider if: ? You fall. Be sure to tell your health care provider about all falls, even ones that seem minor. ? You feel dizzy, sleepy, or off-balance.  Take over-the-counter and prescription medicines only as told by your health care provider. These include any supplements.  Eat a healthy diet and maintain a healthy weight. A healthy diet includes low-fat dairy products, low-fat (lean) meats, and fiber from whole grains, beans, and  lots of fruits and vegetables. Home safety  Remove any tripping hazards, such as rugs, cords, and clutter.  Install safety equipment such as grab bars in bathrooms and safety rails on stairs.  Keep rooms and walkways well-lit. Activity  Follow a regular exercise program to stay fit. This will help you maintain  your balance. Ask your health care provider what types of exercise are appropriate for you.  If you need a cane or walker, use it as recommended by your health care provider.  Wear supportive shoes that have nonskid soles.   Lifestyle  Do not drink alcohol if your health care provider tells you not to drink.  If you drink alcohol, limit how much you have: ? 0-1 drink a day for women. ? 0-2 drinks a day for men.  Be aware of how much alcohol is in your drink. In the [place] one drink equals one typical bottle of beer (12 oz), one-half glass of wine (5 oz), or one shot of hard liquor (1 oz).  Do not use any products that contain nicotine or tobacco, such as cigarettes and e-cigarettes. If you need help quitting, ask your health care provider. Summary  Having a healthy lifestyle and getting preventive care can help to protect your health and wellness after age 83.  Screening and testing are the best way to find a health problem early and help you avoid having a fall. Early diagnosis and treatment give you the best chance for managing medical conditions that are more common for people who are older than age 53.  Falls are a major cause of broken bones and head injuries in people who are older than age 28. Take precautions to prevent a fall at home.  Work with your health care provider to learn what changes you can make to improve your health and wellness and to prevent falls. This information is not intended to replace advice given to you by your health care provider. Make sure you discuss any questions you have with your health care provider. Document Revised: 03/08/2019 Document Reviewed: 09/28/2017 Elsevier Patient Education  2021 [employer].   If you have lab work done today you will be contacted with your lab results within the next 2 weeks.  If you have not heard from [place] then please contact us. The fastest way to get your results is to register for My Chart.   IF you received an  x-ray today, you will receive an invoice from [facility]. Please contact [facility] at [phone] with questions or concerns regarding your invoice.   IF you received labwork today, you will receive an invoice from [facility]. Please contact [facility] at [phone] with questions or concerns regarding your invoice.   Our billing staff will not be able to assist you with questions regarding bills from these companies.  You will be contacted with the lab results as soon as they are available. The fastest way to get your results is to activate your My Chart account. Instructions are located on the last page of this paperwork. If you have not heard from [place] regarding the results in 2 weeks, please contact this office.

## 2021-02-10 LAB — URINE CULTURE

## 2021-02-27 NOTE — Progress Notes (Signed)
Primary Physician/Referring:  Just, Laurita Quint, FNP  [name], female    DOB: 1950/12/04, 70 y.o.   MRN: [id number]  Chief Complaint  Patient presents with  . PSVT  . Follow-up   HPI:    Lisa Costa  is a 70 y.o. female with history of PSVT and hypercholesterolemia, pre-diabetes, originally referred to our office for evaluation of palpitations.   Patient presents for 56-monthfollow-up of palpitations.  At last visit patient was relatively asymptomatic and therefore no medication changes were made at that time. In the last 6 months patient has only had 1-2 episodes of brief heart racing symptoms.  Denies associated symptoms.  Denies chest pain, dyspnea, syncope, near syncope, dizziness.  Denies orthopnea, PND, leg swelling.  [place]  She has not taken diltiazem in the last 6 months.  Overall patient is feeling well.    Past Medical History:  Diagnosis Date  . Allergy   . Anxiety   . Arthritis   . Cataract    both eyes  . Depression   . Depression    Phreesia 02/06/2021  . GERD (gastroesophageal reflux disease)   . Heart murmur   . Hx of adenomatous polyp of colon 03/16/2018  . Hypercholesteremia   . Hyperlipidemia    Phreesia 02/06/2021  . Left radial head fracture   . Osteoporosis   . Osteoporosis    Phreesia 02/06/2021  . Tuberculosis    [place]had treatment for 4 months   Past Surgical History:  Procedure Laterality Date  . CESAREAN SECTION     3 births  . COLONOSCOPY    . POLYPECTOMY     Family History  Problem Relation Age of Onset  . Heart disease Mother   . Colon cancer Neg Hx   . Colon polyps Neg Hx   . Esophageal cancer Neg Hx   . Rectal cancer Neg Hx   . Stomach cancer Neg Hx     Social History   Tobacco Use  . Smoking status: Former Smoker    Types: Cigarettes    Quit date: 03/02/2008    Years since  quitting: 13.0  . Smokeless tobacco: Never Used  Substance Use Topics  . Alcohol use: Yes    Alcohol/week: 2.0 standard drinks    Types: 2 Standard drinks or equivalent per week    Comment: occational   Marital Status: Single   ROS  Review of Systems  Constitutional: Negative for malaise/fatigue and weight gain.  Cardiovascular: Positive for palpitations (1-2 brief episodes). Negative for chest pain, claudication, dyspnea on exertion, leg swelling, near-syncope, orthopnea, paroxysmal nocturnal dyspnea and syncope.  Respiratory: Negative for shortness of breath.   Hematologic/Lymphatic: Does not bruise/bleed easily.  Gastrointestinal: Negative for melena.  Neurological: Negative for dizziness and weakness.     Objective  Blood pressure 116/66, pulse 87, temperature 97.8 F (36.6 C), height '5\' 4"'  (1.626 m), weight 165 lb (74.8 kg), SpO2 98 %.  Vitals with BMI 03/02/2021 02/09/2021 09/01/2020  Height '5\' 4"'  '5\' 4"'  '5\' 4"'   Weight 165 lbs 162 lbs 156 lbs 6 oz  BMI 28.31 [phone] Systolic [id number] Diastolic 66 68 67  Pulse 87 69 69     Physical Exam Vitals reviewed.  HENT:     Head: Normocephalic and atraumatic.  Cardiovascular:     Rate and Rhythm: Normal rate and  regular rhythm.     Pulses: Intact distal pulses.     Heart sounds: S1 normal and S2 normal. No murmur heard. No gallop.   Pulmonary:     Effort: Pulmonary effort is normal. No respiratory distress.     Breath sounds: No wheezing, rhonchi or rales.  Musculoskeletal:     Right lower leg: No edema.     Left lower leg: No edema.  Skin:    Capillary Refill: Capillary refill takes less than 2 seconds.  Neurological:     General: No focal deficit present.     Mental Status: She is alert and oriented to person, place, and time.    Laboratory examination:   No results for input(s): NA, K, CL, CO2, GLUCOSE, BUN, CREATININE, CALCIUM, GFRNONAA, GFRAA in the last 8760 hours. CrCl cannot be calculated (Patient's most  recent lab result is older than the maximum 21 days allowed.).  CMP Latest Ref Rng & Units 11/08/2018 06/29/2018 01/07/2018  Glucose 65 - 99 mg/dL 94 88 89  BUN 8 - 27 mg/dL '13 16 12  ' Creatinine 0.57 - 1.00 mg/dL 0.76 0.76 0.67  Sodium 134 - 144 mmol/L 143 142 142  Potassium 3.5 - 5.2 mmol/L 4.4 4.6 4.7  Chloride 96 - 106 mmol/L 106 104 103  CO2 20 - 29 mmol/L '23 22 23  ' Calcium 8.7 - 10.3 mg/dL 9.5 9.9 9.6  Total Protein 6.0 - 8.5 g/dL 6.8 7.0 7.2  Total Bilirubin 0.0 - 1.2 mg/dL 0.4 0.2 0.3  Alkaline Phos 39 - 117 IU/L 68 62 67  AST 0 - 40 IU/L '17 21 15  ' ALT 0 - 32 IU/L '21 21 17   ' CBC 11/08/2018 06/29/2018 01/07/2018  WBC WILL FOLLOW 6.3 5.3  Hemoglobin WILL FOLLOW 13.7 14.1  Hematocrit WILL FOLLOW 41.3 42.3  Platelets WILL FOLLOW 290 269    Lipid Panel No results for input(s): CHOL, TRIG, LDLCALC, VLDL, HDL, CHOLHDL, LDLDIRECT in the last 8760 hours.  HEMOGLOBIN A1C Lab Results  Component Value Date   HGBA1C 5.6 11/08/2018   TSH No results for input(s): TSH in the last 8760 hours.  External labs:   06/23/2020: Glucose 101, BUN/Cr 13/0.74. EGFR 82. Na/K 140/4.3. Rest of the CMP normal H/H 13/40. MCV 96. Platelets 243 HbA1C 5.6% Chol 165, TG 86, HDL 57, LDL 89 TSH 1.6 normal  Medications and allergies   Allergies  Allergen Reactions  . Tramadol Nausea And Vomiting  . Other      Outpatient Medications Prior to Visit  Medication Sig Dispense Refill  . calcium-vitamin D (OSCAL) 250-125 MG-UNIT per tablet Take 1 tablet by mouth daily. Reported on 11/25/2015    . cetirizine (ZYRTEC) 10 MG tablet Take by mouth.    . chlorhexidine (PERIDEX) 0.12 % solution   0  . Cholecalciferol (VITAMIN D3) 1000 units CAPS Take by mouth.    . cycloSPORINE (RESTASIS) 0.05 % ophthalmic emulsion Place 1 drop into both eyes 2 times daily.    Marland Kitchen ibuprofen (ADVIL,MOTRIN) 800 MG tablet Take 1 tablet (800 mg total) by mouth every 8 (eight) hours as needed. 270 tablet 0  . meclizine (ANTIVERT) 12.5  MG tablet Take 1 tablet (12.5 mg total) by mouth 3 (three) times daily as needed for dizziness. 30 tablet 6  . Olopatadine HCl 0.2 % SOLN Apply 1 drop to eye daily. 1 Bottle 3  . pantoprazole (PROTONIX) 40 MG tablet Take 1 tablet (40 mg total) by mouth daily. 90 tablet 3  . pravastatin (  PRAVACHOL) 40 MG tablet Take 1.5 tablets (60 mg total) by mouth daily. 90 tablet 3  . triamcinolone (KENALOG) 0.1 % Apply topically 2 (two) times daily.    Marland Kitchen diltiazem (CARDIZEM) 30 MG tablet TAKE 1 TABLET BY MOUTH AS NEEDED FOR PALPITATIONS. MAX DOSE 4 TABLETS PER 24HOUR PERIOD 120 tablet 1  . aspirin EC 81 MG tablet Take 81 mg by mouth daily. Swallow whole. (Patient not taking: Reported on 03/02/2021)    . tiZANidine (ZANAFLEX) 4 MG capsule Take 4 mg by mouth 2 (two) times daily as needed. (Patient not taking: Reported on 03/02/2021)     No facility-administered medications prior to visit.    Radiology:   No results found.  Cardiac Studies:   Echocardiogram 07/2018: EF 69%.  Grade 1 diastolic dysfunction. Trace tricuspid regurgitation.  Treadmill stress test 07/2018: 7.6 METS.  No ischemic changes.  EKG:   EKG 03/02/2020: Sinus rhythm at a rate of 80 bpm.  Left atrial enlargement.  Normal axis. Non-specific T wave abnormality. Compared to EKG 07/14/2020, no significant change.   Assessment     ICD-10-CM   1. Palpitations  R00.2 EKG 12-Lead    diltiazem (CARDIZEM) 30 MG tablet     Medications Discontinued During This Encounter  Medication Reason  . tiZANidine (ZANAFLEX) 4 MG capsule Error  . diltiazem (CARDIZEM) 30 MG tablet Reorder  . aspirin EC 81 MG tablet Discontinued by provider    Meds ordered this encounter  Medications  . diltiazem (CARDIZEM) 30 MG tablet    Sig: TAKE 1 TABLET BY MOUTH AS NEEDED FOR PALPITATIONS. MAX DOSE 4 TABLETS PER 24HOUR PERIOD    Dispense:  90 tablet    Refill:  3    Recommendations:   Lisa Costa is a 70 y.o. female with history of PSVT and  hypercholesterolemia, originally referred to our office for evaluation of palpitations.   Patient presents for 50-monthfollow-up of palpitations.  At last visit patient was relatively asymptomatic and therefore no medication changes were made at that time.  Patient remains relatively asymptomatic, and has not used diltiazem in the last 6 months.  However will refill diltiazem 30 mg to be taken as needed for palpitations with maximum of 3 to 4 pills/day.  Patient is feeling well and is excited to return today to return to work [place]  Patient's blood pressure is well controlled.  She is presently stable from a cardiovascular standpoint.  Follow-up in 1 year, sooner if needed, for hypercholesterolemia and palpitations.   CAlethia Berthold PA-C 03/02/2021, 1:06 PM Office: [phone]

## 2021-03-02 ENCOUNTER — Encounter: Payer: Self-pay | Admitting: Student

## 2021-03-02 ENCOUNTER — Ambulatory Visit: Payer: Medicare HMO | Admitting: Student

## 2021-03-02 ENCOUNTER — Other Ambulatory Visit: Payer: Self-pay

## 2021-03-02 VITALS — BP 116/66 | HR 87 | Temp 97.8°F | Ht 64.0 in | Wt 165.0 lb

## 2021-03-02 DIAGNOSIS — R002 Palpitations: Secondary | ICD-10-CM

## 2021-03-02 DIAGNOSIS — R0789 Other chest pain: Secondary | ICD-10-CM

## 2021-03-02 MED ORDER — DILTIAZEM HCL 30 MG PO TABS: ORAL_TABLET | ORAL | 3 refills | Status: DC

## 2021-03-11 DIAGNOSIS — Z20822 Contact with and (suspected) exposure to covid-19: Secondary | ICD-10-CM | POA: Diagnosis not present

## 2021-03-11 DIAGNOSIS — R110 Nausea: Secondary | ICD-10-CM | POA: Diagnosis not present

## 2021-03-11 DIAGNOSIS — R509 Fever, unspecified: Secondary | ICD-10-CM | POA: Diagnosis not present

## 2021-03-11 DIAGNOSIS — R102 Pelvic and perineal pain: Secondary | ICD-10-CM | POA: Diagnosis not present

## 2021-03-11 DIAGNOSIS — Z87891 Personal history of nicotine dependence: Secondary | ICD-10-CM | POA: Diagnosis not present

## 2021-03-16 DIAGNOSIS — R194 Change in bowel habit: Secondary | ICD-10-CM | POA: Diagnosis not present

## 2021-03-16 DIAGNOSIS — R300 Dysuria: Secondary | ICD-10-CM | POA: Diagnosis not present

## 2021-03-16 DIAGNOSIS — R1031 Right lower quadrant pain: Secondary | ICD-10-CM | POA: Diagnosis not present

## 2021-03-16 DIAGNOSIS — N3000 Acute cystitis without hematuria: Secondary | ICD-10-CM | POA: Diagnosis not present

## 2021-03-17 DIAGNOSIS — N3000 Acute cystitis without hematuria: Secondary | ICD-10-CM | POA: Diagnosis not present

## 2021-03-17 DIAGNOSIS — R1031 Right lower quadrant pain: Secondary | ICD-10-CM | POA: Diagnosis not present

## 2021-03-17 DIAGNOSIS — R194 Change in bowel habit: Secondary | ICD-10-CM | POA: Diagnosis not present

## 2021-04-03 DIAGNOSIS — R300 Dysuria: Secondary | ICD-10-CM | POA: Diagnosis not present

## 2021-05-18 ENCOUNTER — Other Ambulatory Visit: Payer: Self-pay

## 2021-05-18 ENCOUNTER — Ambulatory Visit (INDEPENDENT_AMBULATORY_CARE_PROVIDER_SITE_OTHER): Payer: Medicare HMO | Admitting: Licensed Clinical Social Worker

## 2021-05-18 DIAGNOSIS — F418 Other specified anxiety disorders: Secondary | ICD-10-CM | POA: Diagnosis not present

## 2021-05-18 DIAGNOSIS — R69 Illness, unspecified: Secondary | ICD-10-CM | POA: Diagnosis not present
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
## 2021-07-06 DIAGNOSIS — K219 Gastro-esophageal reflux disease without esophagitis: Secondary | ICD-10-CM | POA: Diagnosis not present

## 2021-07-06 DIAGNOSIS — R32 Unspecified urinary incontinence: Secondary | ICD-10-CM | POA: Diagnosis not present

## 2021-07-06 DIAGNOSIS — R42 Dizziness and giddiness: Secondary | ICD-10-CM | POA: Diagnosis not present

## 2021-07-06 DIAGNOSIS — H04129 Dry eye syndrome of unspecified lacrimal gland: Secondary | ICD-10-CM | POA: Diagnosis not present

## 2021-07-06 DIAGNOSIS — R030 Elevated blood-pressure reading, without diagnosis of hypertension: Secondary | ICD-10-CM | POA: Diagnosis not present

## 2021-07-06 DIAGNOSIS — Z87891 Personal history of nicotine dependence: Secondary | ICD-10-CM | POA: Diagnosis not present

## 2021-07-06 DIAGNOSIS — Z7982 Long term (current) use of aspirin: Secondary | ICD-10-CM | POA: Diagnosis not present

## 2021-07-06 DIAGNOSIS — I499 Cardiac arrhythmia, unspecified: Secondary | ICD-10-CM | POA: Diagnosis not present

## 2021-07-06 DIAGNOSIS — E785 Hyperlipidemia, unspecified: Secondary | ICD-10-CM | POA: Diagnosis not present

## 2021-08-17 DIAGNOSIS — R739 Hyperglycemia, unspecified: Secondary | ICD-10-CM | POA: Diagnosis not present

## 2021-08-17 DIAGNOSIS — E7800 Pure hypercholesterolemia, unspecified: Secondary | ICD-10-CM | POA: Diagnosis not present

## 2021-08-17 DIAGNOSIS — L239 Allergic contact dermatitis, unspecified cause: Secondary | ICD-10-CM | POA: Diagnosis not present

## 2021-08-17 DIAGNOSIS — M25551 Pain in right hip: Secondary | ICD-10-CM | POA: Diagnosis not present

## 2021-08-17 DIAGNOSIS — I471 Supraventricular tachycardia: Secondary | ICD-10-CM | POA: Diagnosis not present

## 2021-08-17 DIAGNOSIS — Z23 Encounter for immunization: Secondary | ICD-10-CM | POA: Diagnosis not present

## 2021-08-17 DIAGNOSIS — K219 Gastro-esophageal reflux disease without esophagitis: Secondary | ICD-10-CM | POA: Diagnosis not present

## 2021-09-23 ENCOUNTER — Encounter: Payer: Self-pay | Admitting: Obstetrics and Gynecology

## 2021-09-23 ENCOUNTER — Ambulatory Visit: Payer: Medicare HMO | Admitting: Obstetrics and Gynecology

## 2021-09-23 DIAGNOSIS — R102 Pelvic and perineal pain: Secondary | ICD-10-CM | POA: Diagnosis not present

## 2021-09-23 NOTE — Progress Notes (Signed)
NGYN pt reports she had a painful "cyst" near clitoral area.  Cyst is resolved but she is still experiencing pain in the area.  Reports normal mammogram Feb 2022 Colonoscopy due in 10 years per pt  Pt reports receiving flu vaccine fall 2022

## 2021-09-23 NOTE — Progress Notes (Signed)
  CC: vulvar pain Subjective:    [name], female    DOB: Jul 24, 1951, 70 y.o.   MRN: [id number]  HPI 70 yo G4P3 seen for evaluation of a "vaginal lesion" which is causing pain.  She has noticed this pain x 1 month.  She states the lesion was hard but is no longer present.  Pt entered menopause at age 67.  She does not use vaginal estrogen.  Review of chart shows hx of vulvar varicose veins   Review of Systems     Objective:   Physical Exam Vitals:   09/23/21 0906  BP: (!) 154/73  Pulse: 74   SVE: no obvious lesions noted, small varicosities noted, not thrombosed. Pt asked to show where she felt lesion or discomfort in the past.   No lesion or abnormality noted.      Assessment & Plan:   1. Vulvar pain Expectant management If varicose veins return, consider referral to vascular surgeon    Griffin Basil, MD Faculty Attending, [facility]

## 2021-11-03 DIAGNOSIS — B349 Viral infection, unspecified: Secondary | ICD-10-CM | POA: Diagnosis not present

## 2021-11-03 DIAGNOSIS — J029 Acute pharyngitis, unspecified: Secondary | ICD-10-CM | POA: Diagnosis not present

## 2021-12-07 ENCOUNTER — Encounter: Payer: Self-pay | Admitting: Emergency Medicine

## 2022-01-28 DIAGNOSIS — Z1231 Encounter for screening mammogram for malignant neoplasm of breast: Secondary | ICD-10-CM | POA: Diagnosis not present

## 2022-02-11 DIAGNOSIS — H5203 Hypermetropia, bilateral: Secondary | ICD-10-CM | POA: Diagnosis not present

## 2022-02-11 DIAGNOSIS — H25813 Combined forms of age-related cataract, bilateral: Secondary | ICD-10-CM | POA: Diagnosis not present

## 2022-02-11 DIAGNOSIS — H524 Presbyopia: Secondary | ICD-10-CM | POA: Diagnosis not present

## 2022-02-11 DIAGNOSIS — H52203 Unspecified astigmatism, bilateral: Secondary | ICD-10-CM | POA: Diagnosis not present

## 2022-02-15 DIAGNOSIS — E2839 Other primary ovarian failure: Secondary | ICD-10-CM | POA: Diagnosis not present

## 2022-02-15 DIAGNOSIS — E7800 Pure hypercholesterolemia, unspecified: Secondary | ICD-10-CM | POA: Diagnosis not present

## 2022-02-15 DIAGNOSIS — R399 Unspecified symptoms and signs involving the genitourinary system: Secondary | ICD-10-CM | POA: Diagnosis not present

## 2022-02-15 DIAGNOSIS — M7989 Other specified soft tissue disorders: Secondary | ICD-10-CM | POA: Diagnosis not present

## 2022-02-15 DIAGNOSIS — Z0000 Encounter for general adult medical examination without abnormal findings: Secondary | ICD-10-CM | POA: Diagnosis not present

## 2022-02-15 DIAGNOSIS — R8290 Unspecified abnormal findings in urine: Secondary | ICD-10-CM | POA: Diagnosis not present

## 2022-02-15 DIAGNOSIS — I471 Supraventricular tachycardia: Secondary | ICD-10-CM | POA: Diagnosis not present

## 2022-02-15 DIAGNOSIS — Z87891 Personal history of nicotine dependence: Secondary | ICD-10-CM | POA: Diagnosis not present

## 2022-02-15 DIAGNOSIS — R739 Hyperglycemia, unspecified: Secondary | ICD-10-CM | POA: Diagnosis not present

## 2022-03-01 ENCOUNTER — Ambulatory Visit: Payer: Medicare HMO | Admitting: Student

## 2022-03-03 NOTE — Progress Notes (Signed)
? ?Primary Physician/Referring:  Just, Laurita Quint, FNP (Inactive) ? ?[name], female    DOB: 01/07/1951, 71 y.o.   MRN: [id number] ? ?Chief Complaint  ?Patient presents with  ? Palpitations  ? hld  ?  1 year  ? ?HPI:   ? ?Lisa Costa  is a 71 y.o. female with history of PSVT and hypercholesterolemia, pre-diabetes, originally referred to our office for evaluation of palpitations.  ? ?Patient presents for 1 year follow-up.  Last office visit patient was stable from a cardiovascular standpoint and no changes were made.  Patient has been having more frequent episodes of palpitations with associated shortness of breath and mild chest discomfort, all of her symptoms are relieved quickly with use of diltiazem.  Patient saw PCP earlier this week and blood pressure was 134/68 mmHg.  Patient also complaining of bilateral foot pain worse when she is walking. ? ?Past Medical History:  ?Diagnosis Date  ? Allergy   ? Anxiety   ? Arthritis   ? Cataract   ? both eyes  ? Depression   ? Depression   ? [employer]reesia 02/06/2021  ? GERD (gastroesophageal reflux disease)   ? Heart murmur   ? Hx of adenomatous polyp of colon 03/16/2018  ? Hypercholesteremia   ? Hyperlipidemia   ? [employer]rees[employer] 02/06/2021  ? Left radial head fracture   ? Osteoporosis   ? Osteoporosis   ? [employer]rees[employer] 02/06/2021  ? Tuberculosis   ? vaccine in [place] PPD tests positive each time/had treatment for 4 months  ? ?Past Surgical History:  ?Procedure Laterality Date  ? CESAREAN SECTION    ? 3 births  ? COLONOSCOPY    ? POLYPECTOMY    ? ?Family History  ?Problem Relation Age of Onset  ? Heart disease Mother   ? Colon cancer Neg Hx   ? Colon polyps Neg Hx   ? Esophageal cancer Neg Hx   ? Rectal cancer Neg Hx   ? Stomach cancer Neg Hx   ?  ?Social History  ? ?Tobacco Use  ? Smoking status: Former  ?  Types: Cigarettes  ?  Quit date: 03/02/2008  ?  Years since quitting: 14.0  ? Smokeless tobacco: Never  ?Substance Use Topics  ? Alcohol use:  Yes  ?  Alcohol/week: 2.0 standard drinks  ?  Types: 2 Standard drinks or equivalent per week  ?  Comment: occational  ? ?Marital Status: Single  ? ?ROS  ?Review of Systems  ?Constitutional: Negative for malaise/fatigue and weight gain.  ?Cardiovascular:  Positive for palpitations (daily over the last 1 week). Negative for chest pain, claudication, dyspnea on exertion, leg swelling, near-syncope, orthopnea, paroxysmal nocturnal dyspnea and syncope.  ?Respiratory:  Negative for shortness of breath.   ?Hematologic/Lymphatic: Does not bruise/bleed easily.  ?Gastrointestinal:  Negative for melena.  ?Neurological:  Negative for dizziness and weakness.   ? ?Objective  ?Blood pressure (!) 154/77, pulse 70, temperature 98 ?F (36.7 ?C), temperature source Temporal, resp. rate 17, height '5\' 4"'  (1.626 m), weight 173 lb (78.5 kg), SpO2 98 %.  ? ?  03/04/2022  ? 10:45 AM 03/04/2022  ? 10:23 AM 09/23/2021  ?  9:06 AM  ?Vitals with BMI  ?Height  '5\' 4"'    ?Weight  173 lbs 172 lbs  ?BMI  29.68   ?Systolic 626 948 546  ?Diastolic 77 76 73  ?Pulse 70 71 74  ?  ? Physical Exam ?Vitals reviewed.  ?HENT:  ?   Head:  Normocephalic and atraumatic.  ?Cardiovascular:  ?   Rate and Rhythm: Normal rate and regular rhythm.  ?   Pulses: Intact distal pulses.  ?   Heart sounds: S1 normal and S2 normal. No murmur heard. ?  No gallop.  ?Pulmonary:  ?   Effort: Pulmonary effort is normal. No respiratory distress.  ?   Breath sounds: No wheezing, rhonchi or rales.  ?Musculoskeletal:  ?   Right lower leg: No edema.  ?   Left lower leg: No edema.  ?Skin: ?   Capillary Refill: Capillary refill takes less than 2 seconds.  ?Neurological:  ?   General: No focal deficit present.  ?   Mental Status: She is alert and oriented to person, place, and time.  ? ?Laboratory examination:  ? ?No results for input(s): NA, K, CL, CO2, GLUCOSE, BUN, CREATININE, CALCIUM, GFRNONAA, GFRAA in the last 8760 hours. ?CrCl cannot be calculated (Patient's most recent lab result is  older than the maximum 21 days allowed.).  ? ?  Latest Ref Rng & Units 11/08/2018  ? 12:06 PM 06/29/2018  ?  5:39 PM 01/07/2018  ? 10:45 AM  ?CMP  ?Glucose 65 - 99 mg/dL 94   88   89    ?BUN 8 - 27 mg/dL '13   16   12    ' ?Creatinine 0.57 - 1.00 mg/dL 0.76   0.76   0.67    ?Sodium 134 - 144 mmol/L 143   142   142    ?Potassium 3.5 - 5.2 mmol/L 4.4   4.6   4.7    ?Chloride 96 - 106 mmol/L 106   104   103    ?CO2 20 - 29 mmol/L '23   22   23    ' ?Calcium 8.7 - 10.3 mg/dL 9.5   9.9   9.6    ?Total Protein 6.0 - 8.5 g/dL 6.8   7.0   7.2    ?Total Bilirubin 0.0 - 1.2 mg/dL 0.4   0.2   0.3    ?Alkaline Phos 39 - 117 IU/L 68   62   67    ?AST 0 - 40 IU/L '17   21   15    ' ?ALT 0 - 32 IU/L '21   21   17    ' ? ? ?  11/08/2018  ? 12:06 PM 06/29/2018  ?  5:39 PM 01/07/2018  ? 10:45 AM  ?CBC  ?WBC WILL FOLLOW  P 6.3   5.3    ?Hemoglobin WILL FOLLOW  P 13.7   14.1    ?Hematocrit WILL FOLLOW  P 41.3   42.3    ?Platelets WILL FOLLOW  P 290   269    ?  ?P Preliminary result  ? ? ?Lipid Panel ?No results for input(s): CHOL, TRIG, LDLCALC, VLDL, HDL, CHOLHDL, LDLDIRECT in the last 8760 hours. ? ?HEMOGLOBIN A1C ?Lab Results  ?Component Value Date  ? HGBA1C 5.6 11/08/2018  ? ?TSH ?No results for input(s): TSH in the last 8760 hours. ? ?External labs:  ? ?06/23/2020: ?Glucose 101, BUN/Cr 13/0.74. EGFR 82. Na/K 140/4.3. Rest of the CMP normal ?H/H 13/40. MCV 96. Platelets 243 ?HbA1C 5.6% ?Chol 165, TG 86, HDL 57, LDL 89 ?TSH 1.6 normal ?Allergies  ? ?Allergies  ?Allergen Reactions  ? Tramadol Nausea And Vomiting  ? Other   ?  ? ? ?Medications Prior to Visit:  ? ?Outpatient Medications Prior to Visit  ?Medication Sig Dispense Refill  ?  cetirizine (ZYRTEC) 10 MG tablet Take by mouth.    ? chlorhexidine (PERIDEX) 0.12 % solution   0  ? Cholecalciferol (VITAMIN D3) 1000 units CAPS Take by mouth.    ? cycloSPORINE (RESTASIS) 0.05 % ophthalmic emulsion Place 1 drop into both eyes 2 times daily.    ? diltiazem (CARDIZEM) 30 MG tablet TAKE 1 TABLET BY MOUTH AS  NEEDED FOR PALPITATIONS. MAX DOSE 4 TABLETS PER 24HOUR PERIOD 90 tablet 3  ? ibuprofen (ADVIL,MOTRIN) 800 MG tablet Take 1 tablet (800 mg total) by mouth every 8 (eight) hours as needed. 270 tablet 0  ? meclizine (ANTIVERT) 12.5 MG tablet Take 1 tablet (12.5 mg total) by mouth 3 (three) times daily as needed for dizziness. 30 tablet 6  ? Olopatadine HCl 0.2 % SOLN Apply 1 drop to eye daily. 1 Bottle 3  ? pantoprazole (PROTONIX) 40 MG tablet Take 1 tablet by mouth daily.    ? pravastatin (PRAVACHOL) 40 MG tablet Take 1.5 tablets (60 mg total) by mouth daily. 90 tablet 3  ? triamcinolone (KENALOG) 0.1 % Apply topically 2 (two) times daily.    ? calcium-vitamin D (OSCAL) 250-125 MG-UNIT per tablet Take 1 tablet by mouth daily. Reported on 11/25/2015    ? pantoprazole (PROTONIX) 40 MG tablet Take 1 tablet (40 mg total) by mouth daily. 90 tablet 3  ? ?No facility-administered medications prior to visit.  ? ?Final Medications at End of Visit   ? ?Current Meds  ?Medication Sig  ? cetirizine (ZYRTEC) 10 MG tablet Take by mouth.  ? chlorhexidine (PERIDEX) 0.12 % solution   ? Cholecalciferol (VITAMIN D3) 1000 units CAPS Take by mouth.  ? cycloSPORINE (RESTASIS) 0.05 % ophthalmic emulsion Place 1 drop into both eyes 2 times daily.  ? diltiazem (CARDIZEM CD) 120 MG 24 hr capsule Take 1 capsule (120 mg total) by mouth daily.  ? diltiazem (CARDIZEM) 30 MG tablet TAKE 1 TABLET BY MOUTH AS NEEDED FOR PALPITATIONS. MAX DOSE 4 TABLETS PER 24HOUR PERIOD  ? ibuprofen (ADVIL,MOTRIN) 800 MG tablet Take 1 tablet (800 mg total) by mouth every 8 (eight) hours as needed.  ? meclizine (ANTIVERT) 12.5 MG tablet Take 1 tablet (12.5 mg total) by mouth 3 (three) times daily as needed for dizziness.  ? Olopatadine HCl 0.2 % SOLN Apply 1 drop to eye daily.  ? pantoprazole (PROTONIX) 40 MG tablet Take 1 tablet by mouth daily.  ? pravastatin (PRAVACHOL) 40 MG tablet Take 1.5 tablets (60 mg total) by mouth daily.  ? triamcinolone (KENALOG) 0.1 % Apply  topically 2 (two) times daily.  ? ?Radiology:  ? ?No results found. ? ?Cardiac Studies:  ? ?Echocardiogram 07/2018: ?EF 69%.  Grade 1 diastolic dysfunction. ?Trace tricuspid regurgitation. ?  ?Treadmill s

## 2022-03-04 ENCOUNTER — Ambulatory Visit: Payer: Medicare HMO | Admitting: Student

## 2022-03-04 ENCOUNTER — Encounter: Payer: Self-pay | Admitting: Student

## 2022-03-04 VITALS — BP 154/77 | HR 70 | Temp 98.0°F | Resp 17 | Ht 64.0 in | Wt 173.0 lb

## 2022-03-04 DIAGNOSIS — R002 Palpitations: Secondary | ICD-10-CM

## 2022-03-04 DIAGNOSIS — M79671 Pain in right foot: Secondary | ICD-10-CM

## 2022-03-04 DIAGNOSIS — M79672 Pain in left foot: Secondary | ICD-10-CM | POA: Diagnosis not present

## 2022-03-04 MED ORDER — DILTIAZEM HCL ER COATED BEADS 120 MG PO CP24: 120.0000 mg | ORAL_CAPSULE | Freq: Every day | ORAL | 3 refills | Status: DC

## 2022-03-11 ENCOUNTER — Ambulatory Visit: Payer: Medicare HMO

## 2022-03-11 DIAGNOSIS — Z780 Asymptomatic menopausal state: Secondary | ICD-10-CM | POA: Diagnosis not present

## 2022-03-11 DIAGNOSIS — M810 Age-related osteoporosis without current pathological fracture: Secondary | ICD-10-CM | POA: Diagnosis not present

## 2022-03-11 DIAGNOSIS — M79671 Pain in right foot: Secondary | ICD-10-CM

## 2022-03-11 DIAGNOSIS — M79672 Pain in left foot: Secondary | ICD-10-CM | POA: Diagnosis not present

## 2022-03-11 DIAGNOSIS — M8589 Other specified disorders of bone density and structure, multiple sites: Secondary | ICD-10-CM | POA: Diagnosis not present

## 2022-03-17 DIAGNOSIS — H25811 Combined forms of age-related cataract, right eye: Secondary | ICD-10-CM | POA: Diagnosis not present

## 2022-03-17 DIAGNOSIS — H2511 Age-related nuclear cataract, right eye: Secondary | ICD-10-CM | POA: Diagnosis not present

## 2022-03-17 DIAGNOSIS — H25812 Combined forms of age-related cataract, left eye: Secondary | ICD-10-CM | POA: Diagnosis not present

## 2022-03-24 DIAGNOSIS — H2512 Age-related nuclear cataract, left eye: Secondary | ICD-10-CM | POA: Diagnosis not present

## 2022-03-24 DIAGNOSIS — H25012 Cortical age-related cataract, left eye: Secondary | ICD-10-CM | POA: Diagnosis not present

## 2022-04-20 DIAGNOSIS — M79672 Pain in left foot: Secondary | ICD-10-CM | POA: Diagnosis not present

## 2022-04-20 DIAGNOSIS — M7742 Metatarsalgia, left foot: Secondary | ICD-10-CM | POA: Diagnosis not present

## 2022-04-22 DIAGNOSIS — H524 Presbyopia: Secondary | ICD-10-CM | POA: Diagnosis not present

## 2022-04-22 DIAGNOSIS — Z0100 Encounter for examination of eyes and vision without abnormal findings: Secondary | ICD-10-CM | POA: Diagnosis not present

## 2022-04-28 NOTE — Progress Notes (Unsigned)
Primary Physician/Referring:  Just, Laurita Quint, FNP (Inactive)  [name], female    DOB: 1951-01-19, 71 y.o.   MRN: [id number]  No chief complaint on file.  HPI:    Lisa Costa  is a 71 y.o. female with history of PSVT and hypercholesterolemia, pre-diabetes, originally referred to our office for evaluation of palpitations.   Patient was last seen in our office 03/04/22 at which time patient was complaining of daily palpitations and bilateral foot pain when walking. Therefore switched patient to long-acting diltiazem with additional PRN short-acting doses for palpitations. Also ordered ABI which was normal. Patient now presents for 6 week follow up. ***  ***  Patient presents for 1 year follow-up.  Last office visit patient was stable from a cardiovascular standpoint and no changes were made.  Patient has been having more frequent episodes of palpitations with associated shortness of breath and mild chest discomfort, all of her symptoms are relieved quickly with use of diltiazem.  Patient saw PCP earlier this week and blood pressure was 134/68 mmHg.  Patient also complaining of bilateral foot pain worse when she is walking.  Past Medical History:  Diagnosis Date   Allergy    Anxiety    Arthritis    Cataract    both eyes   Depression    Depression    [employer]reesia 02/06/2021   GERD (gastroesophageal reflux disease)    Heart murmur    Hx of adenomatous polyp of colon 03/16/2018   Hypercholesteremia    Hyperlipidemia    [employer]reesia 02/06/2021   Left radial head fracture    Osteoporosis    Osteoporosis    [employer]reesia 02/06/2021   Tuberculosis    vaccine in [place] PPD tests positive each time/had treatment for 4 months   Past Surgical History:  Procedure Laterality Date   CESAREAN SECTION     3 births   COLONOSCOPY     POLYPECTOMY     Family History  Problem Relation Age of Onset   Heart disease Mother    Colon cancer Neg Hx    Colon polyps  Neg Hx    Esophageal cancer Neg Hx    Rectal cancer Neg Hx    Stomach cancer Neg Hx     Social History   Tobacco Use   Smoking status: Former    Types: Cigarettes    Quit date: 03/02/2008    Years since quitting: 14.1   Smokeless tobacco: Never  Substance Use Topics   Alcohol use: Yes    Alcohol/week: 2.0 standard drinks    Types: 2 Standard drinks or equivalent per week    Comment: occational   Marital Status: Single   ROS  Review of Systems  Constitutional: Negative for malaise/fatigue and weight gain.  Cardiovascular:  Positive for palpitations (daily over the last 1 week). Negative for chest pain, claudication, dyspnea on exertion, leg swelling, near-syncope, orthopnea, paroxysmal nocturnal dyspnea and syncope.  Respiratory:  Negative for shortness of breath.   Hematologic/Lymphatic: Does not bruise/bleed easily.  Gastrointestinal:  Negative for melena.  Neurological:  Negative for dizziness and weakness.    Objective  There were no vitals taken for this visit.     03/04/2022   10:45 AM 03/04/2022   10:23 AM 09/23/2021    9:06 AM  Vitals with BMI  Height  '5\' 4"'    Weight  173 lbs 172 lbs  BMI  32.67   Systolic 124 580 998  Diastolic 77 76 73  Pulse 70 71 74     Physical Exam Vitals reviewed.  HENT:     Head: Normocephalic and atraumatic.  Cardiovascular:     Rate and Rhythm: Normal rate and regular rhythm.     Pulses: Intact distal pulses.     Heart sounds: S1 normal and S2 normal. No murmur heard.   No gallop.  Pulmonary:     Effort: Pulmonary effort is normal. No respiratory distress.     Breath sounds: No wheezing, rhonchi or rales.  Musculoskeletal:     Right lower leg: No edema.     Left lower leg: No edema.  Skin:    Capillary Refill: Capillary refill takes less than 2 seconds.  Neurological:     General: No focal deficit present.     Mental Status: She is alert and oriented to person, place, and time.   Laboratory examination:   No results for  input(s): NA, K, CL, CO2, GLUCOSE, BUN, CREATININE, CALCIUM, GFRNONAA, GFRAA in the last 8760 hours. CrCl cannot be calculated (Patient's most recent lab result is older than the maximum 21 days allowed.).     Latest Ref Rng & Units 11/08/2018   12:06 PM 06/29/2018    5:39 PM 01/07/2018   10:45 AM  CMP  Glucose 65 - 99 mg/dL 94   88   89    BUN 8 - 27 mg/dL '13   16   12    ' Creatinine 0.57 - 1.00 mg/dL 0.76   0.76   0.67    Sodium 134 - 144 mmol/L 143   142   142    Potassium 3.5 - 5.2 mmol/L 4.4   4.6   4.7    Chloride 96 - 106 mmol/L 106   104   103    CO2 20 - 29 mmol/L '23   22   23    ' Calcium 8.7 - 10.3 mg/dL 9.5   9.9   9.6    Total Protein 6.0 - 8.5 g/dL 6.8   7.0   7.2    Total Bilirubin 0.0 - 1.2 mg/dL 0.4   0.2   0.3    Alkaline Phos 39 - 117 IU/L 68   62   67    AST 0 - 40 IU/L '17   21   15    ' ALT 0 - 32 IU/L '21   21   17        ' 11/08/2018   12:06 PM 06/29/2018    5:39 PM 01/07/2018   10:45 AM  CBC  WBC WILL FOLLOW  P 6.3   5.3    Hemoglobin WILL FOLLOW  P 13.7   14.1    Hematocrit WILL FOLLOW  P 41.3   42.3    Platelets WILL FOLLOW  P 290   269      P Preliminary result     Lipid Panel No results for input(s): CHOL, TRIG, LDLCALC, VLDL, HDL, CHOLHDL, LDLDIRECT in the last 8760 hours.  HEMOGLOBIN A1C Lab Results  Component Value Date   HGBA1C 5.6 11/08/2018   TSH No results for input(s): TSH in the last 8760 hours.  External labs:   06/23/2020: Glucose 101, BUN/Cr 13/0.74. EGFR 82. Na/K 140/4.3. Rest of the CMP normal H/H 13/40. MCV 96. Platelets 243 HbA1C 5.6% Chol 165, TG 86, HDL 57, LDL 89 TSH 1.6 normal  Allergies   Allergies  Allergen Reactions   Tramadol Nausea And Vomiting   Other  Medications Prior to Visit:   Outpatient Medications Prior to Visit  Medication Sig Dispense Refill   cetirizine (ZYRTEC) 10 MG tablet Take by mouth.     chlorhexidine (PERIDEX) 0.12 % solution   0   Cholecalciferol (VITAMIN D3) 1000 units CAPS Take by mouth.      cycloSPORINE (RESTASIS) 0.05 % ophthalmic emulsion Place 1 drop into both eyes 2 times daily.     diltiazem (CARDIZEM CD) 120 MG 24 hr capsule Take 1 capsule (120 mg total) by mouth daily. 90 capsule 3   diltiazem (CARDIZEM) 30 MG tablet TAKE 1 TABLET BY MOUTH AS NEEDED FOR PALPITATIONS. MAX DOSE 4 TABLETS PER 24HOUR PERIOD 90 tablet 3   ibuprofen (ADVIL,MOTRIN) 800 MG tablet Take 1 tablet (800 mg total) by mouth every 8 (eight) hours as needed. 270 tablet 0   meclizine (ANTIVERT) 12.5 MG tablet Take 1 tablet (12.5 mg total) by mouth 3 (three) times daily as needed for dizziness. 30 tablet 6   Olopatadine HCl 0.2 % SOLN Apply 1 drop to eye daily. 1 Bottle 3   pantoprazole (PROTONIX) 40 MG tablet Take 1 tablet by mouth daily.     pravastatin (PRAVACHOL) 40 MG tablet Take 1.5 tablets (60 mg total) by mouth daily. 90 tablet 3   triamcinolone (KENALOG) 0.1 % Apply topically 2 (two) times daily.     No facility-administered medications prior to visit.   Final Medications at End of Visit    No outpatient medications have been marked as taking for the 04/29/22 encounter (Appointment) with Rayetta Pigg, Kyndahl Jablon C, PA-C.   Radiology:   No results found.  Cardiac Studies:  ABI 03/11/2022:  This exam reveals normal perfusion of the right and left lower extremity  (Rt ABI 1.14 and Lt ABI 1.18) with normal triphasic waveform at the  ankles.    Echocardiogram 07/2018: EF 69%.  Grade 1 diastolic dysfunction. Trace tricuspid regurgitation.   Treadmill stress test 07/2018: 7.6 METS.  No ischemic changes.  EKG:  03/04/2022: Sinus rhythm at a rate of 78 bpm.  Normal axis.  Nonspecific T wave abnormality.  Poor R wave progression, cannot exclude anteroseptal infarct old.  EKG 03/02/2020: Sinus rhythm at a rate of 80 bpm.  Left atrial enlargement.  Normal axis. Non-specific T wave abnormality. Compared to EKG 07/14/2020, no significant change.   Assessment   No diagnosis found.    There are no  discontinued medications.   No orders of the defined types were placed in this encounter.   Recommendations:   Lisa Costa is a 71 y.o. female with history of PSVT and hypercholesterolemia, originally referred to our office for evaluation of palpitations.   Patient was last seen in our office 03/04/22 at which time patient was complaining of daily palpitations and bilateral foot pain when walking. Therefore switched patient to long-acting diltiazem with additional PRN short-acting doses for palpitations. Also ordered ABI which was normal. Patient now presents for 6 week follow up. ***  ***  Patient presents for 1 year follow-up.  Last office visit patient was stable from a cardiovascular standpoint and no changes were made.  Given that patient is now having daily episodes of palpitations shared decision was to switch her to long-acting diltiazem 120 mg daily with continued use of 30 mg fast acting diltiazem as needed for episodes of palpitations.  Given patient's bilateral foot pain with walking we will also obtain ABI.  Follow-up in 8 weeks, sooner if needed for results of cardiac testing and  to reevaluate palpitations.   Alethia Berthold, PA-C 04/28/2022, 12:49 PM Office: [phone]

## 2022-04-29 ENCOUNTER — Ambulatory Visit: Payer: Medicare HMO | Admitting: Student

## 2022-04-29 ENCOUNTER — Encounter: Payer: Self-pay | Admitting: Student

## 2022-04-29 VITALS — BP 132/68 | HR 61 | Temp 98.0°F | Resp 17 | Ht 64.0 in | Wt 170.0 lb

## 2022-04-29 DIAGNOSIS — M79671 Pain in right foot: Secondary | ICD-10-CM

## 2022-04-29 DIAGNOSIS — R002 Palpitations: Secondary | ICD-10-CM | POA: Diagnosis not present

## 2022-04-29 DIAGNOSIS — M79672 Pain in left foot: Secondary | ICD-10-CM | POA: Diagnosis not present

## 2022-05-25 ENCOUNTER — Ambulatory Visit: Payer: Medicare HMO

## 2022-05-25 ENCOUNTER — Encounter: Payer: Self-pay | Admitting: Podiatry

## 2022-05-25 ENCOUNTER — Ambulatory Visit: Payer: Medicare HMO | Admitting: Podiatry

## 2022-05-25 DIAGNOSIS — M722 Plantar fascial fibromatosis: Secondary | ICD-10-CM

## 2022-05-25 MED ORDER — METHYLPREDNISOLONE 4 MG PO TBPK: ORAL_TABLET | ORAL | 0 refills | Status: DC

## 2022-05-25 MED ORDER — TRIAMCINOLONE ACETONIDE 40 MG/ML IJ SUSP
40.0000 mg | Freq: Once | INTRAMUSCULAR | Status: AC
  Administered 2022-05-25: 40 mg

## 2022-05-25 MED ORDER — MELOXICAM 15 MG PO TABS: 15.0000 mg | ORAL_TABLET | Freq: Every day | ORAL | 3 refills | Status: DC

## 2022-05-25 NOTE — Progress Notes (Signed)
Subjective:  [name], female    DOB: 11-26-1951,  MRN: [id number] HPI Chief Complaint  Patient presents with   Ankle Pain    Medial ankle left - aching x 3 months, no injury, thought was a vein issue, but cardiologist checked and said it wasn't, she started wearing old orthotics from years ago and they have helped but still feels like a pinching sensation   New Patient (Initial Visit)    71 y.o. female presents with the above complaint.   ROS: Denies fever chills nausea vomiting muscle aches pains calf pain back pain chest pain shortness of breath.  Past Medical History:  Diagnosis Date   Allergy    Anxiety    Arthritis    Cataract    both eyes   Depression    Depression    [employer]reesia 02/06/2021   GERD (gastroesophageal reflux disease)    Heart murmur    Hx of adenomatous polyp of colon 03/16/2018   Hypercholesteremia    Hyperlipidemia    [employer]ia 02/06/2021   Left radial head fracture    Osteoporosis    Osteoporosis    [employer]ia 02/06/2021   Tuberculosis    vaccine in [place] PPD tests positive each time/had treatment for 4 months   Past Surgical History:  Procedure Laterality Date   CESAREAN SECTION     3 births   COLONOSCOPY     POLYPECTOMY      Current Outpatient Medications:    meloxicam (MOBIC) 15 MG tablet, Take 1 tablet (15 mg total) by mouth daily., Disp: 30 tablet, Rfl: 3   methylPREDNISolone (MEDROL DOSEPAK) 4 MG TBPK tablet, 6 day dose pack - take as directed, Disp: 21 tablet, Rfl: 0   cetirizine (ZYRTEC) 10 MG tablet, Take by mouth., Disp: , Rfl:    chlorhexidine (PERIDEX) 0.12 % solution, , Disp: , Rfl: 0   Cholecalciferol (VITAMIN D3) 1000 units CAPS, Take by mouth., Disp: , Rfl:    cycloSPORINE (RESTASIS) 0.05 % ophthalmic emulsion, Place 1 drop into both eyes 2 times daily., Disp: , Rfl:    diltiazem (CARDIZEM CD) 120 MG 24 hr capsule, Take 1 capsule (120 mg total) by mouth daily., Disp: 90 capsule, Rfl: 3   diltiazem  (CARDIZEM) 30 MG tablet, TAKE 1 TABLET BY MOUTH AS NEEDED FOR PALPITATIONS. Gwyneth Fernandez DOSE 4 TABLETS PER 24HOUR PERIOD, Disp: 90 tablet, Rfl: 3   ibuprofen (ADVIL,MOTRIN) 800 MG tablet, Take 1 tablet (800 mg total) by mouth every 8 (eight) hours as needed., Disp: 270 tablet, Rfl: 0   meclizine (ANTIVERT) 12.5 MG tablet, Take 1 tablet (12.5 mg total) by mouth 3 (three) times daily as needed for dizziness., Disp: 30 tablet, Rfl: 6   Olopatadine HCl 0.2 % SOLN, Apply 1 drop to eye daily., Disp: 1 Bottle, Rfl: 3   pantoprazole (PROTONIX) 40 MG tablet, Take 1 tablet by mouth daily., Disp: , Rfl:    pravastatin (PRAVACHOL) 40 MG tablet, Take 1.5 tablets (60 mg total) by mouth daily., Disp: 90 tablet, Rfl: 3   triamcinolone (KENALOG) 0.1 %, Apply topically 2 (two) times daily., Disp: , Rfl:   Allergies  Allergen Reactions   Tramadol Nausea And Vomiting   Other    Review of Systems Objective:  There were no vitals filed for this visit.  General: Well developed, nourished, in no acute distress, alert and oriented x3   Dermatological: Skin is warm, dry and supple bilateral. Nails x 10 are well maintained; remaining integument appears unremarkable  at this time. There are no open sores, no preulcerative lesions, no rash or signs of infection present.  Vascular: Dorsalis Pedis artery and Posterior Tibial artery pedal pulses are 2/4 bilateral with immedate capillary fill time. Pedal hair growth present. No varicosities and no lower extremity edema present bilateral.   Neruologic: Grossly intact via light touch bilateral. Vibratory intact via tuning fork bilateral. Protective threshold with Semmes Wienstein monofilament intact to all pedal sites bilateral. Patellar and Achilles deep tendon reflexes 2+ bilateral. No Babinski or clonus noted bilateral.   Musculoskeletal: No gross boney pedal deformities bilateral. No pain, crepitus, or limitation noted with foot and ankle range of motion bilateral. Muscular  strength 5/5 in all groups tested bilateral.  She has tenderness on palpation of the tibialis anterior tendon with dorsiflexion inversion against resistance.  She also has tenderness on palpation of the posterior tibial tendon with some fluctuance around the posterior and inferior aspect of the medial malleolus left.  She also has pain on palpation of the bilateral heels at the plantar fascial calcaneal insertion site.  Gait: Unassisted, Nonantalgic.    Radiographs:  Reviewed radiographic findings from another radiologist interpretation.  No films available for personal evaluation.  Assessment & Plan:   Assessment: Planter fasciitis with compensatory pain tibialis anterior and posterior tibial tendon left over right.  Plan: Injected the bilateral heels today 20 mg Kenalog 5 mg Marcaine.  Started her on methylprednisolone to be followed by meloxicam.  Discussed appropriate shoe gear stretching exercises and ice therapy.  She will continue to wear her off-the-shelf orthotics/arch supports and I will follow-up with her in 1 month  She is complaining of a painful ingrown toenail on the right foot as well.     Jacqueleen Pulver T. [place], [place]

## 2022-06-29 ENCOUNTER — Ambulatory Visit: Payer: Medicare HMO | Admitting: Podiatry

## 2022-07-21 DIAGNOSIS — E785 Hyperlipidemia, unspecified: Secondary | ICD-10-CM | POA: Diagnosis not present

## 2022-07-21 DIAGNOSIS — Z87440 Personal history of urinary (tract) infections: Secondary | ICD-10-CM | POA: Diagnosis not present

## 2022-07-21 DIAGNOSIS — M810 Age-related osteoporosis without current pathological fracture: Secondary | ICD-10-CM | POA: Diagnosis not present

## 2022-07-21 DIAGNOSIS — Z809 Family history of malignant neoplasm, unspecified: Secondary | ICD-10-CM | POA: Diagnosis not present

## 2022-07-21 DIAGNOSIS — K219 Gastro-esophageal reflux disease without esophagitis: Secondary | ICD-10-CM | POA: Diagnosis not present

## 2022-07-21 DIAGNOSIS — H04129 Dry eye syndrome of unspecified lacrimal gland: Secondary | ICD-10-CM | POA: Diagnosis not present

## 2022-07-21 DIAGNOSIS — R69 Illness, unspecified: Secondary | ICD-10-CM | POA: Diagnosis not present

## 2022-07-21 DIAGNOSIS — I951 Orthostatic hypotension: Secondary | ICD-10-CM | POA: Diagnosis not present

## 2022-07-21 DIAGNOSIS — Z791 Long term (current) use of non-steroidal anti-inflammatories (NSAID): Secondary | ICD-10-CM | POA: Diagnosis not present

## 2022-07-21 DIAGNOSIS — I10 Essential (primary) hypertension: Secondary | ICD-10-CM | POA: Diagnosis not present

## 2022-07-21 DIAGNOSIS — R32 Unspecified urinary incontinence: Secondary | ICD-10-CM | POA: Diagnosis not present

## 2022-07-21 DIAGNOSIS — Z87891 Personal history of nicotine dependence: Secondary | ICD-10-CM | POA: Diagnosis not present

## 2022-08-03 DIAGNOSIS — W1830XA Fall on same level, unspecified, initial encounter: Secondary | ICD-10-CM | POA: Diagnosis not present

## 2022-08-03 DIAGNOSIS — S2231XA Fracture of one rib, right side, initial encounter for closed fracture: Secondary | ICD-10-CM | POA: Diagnosis not present

## 2022-08-03 DIAGNOSIS — S20211A Contusion of right front wall of thorax, initial encounter: Secondary | ICD-10-CM | POA: Diagnosis not present

## 2022-08-19 DIAGNOSIS — L989 Disorder of the skin and subcutaneous tissue, unspecified: Secondary | ICD-10-CM | POA: Diagnosis not present

## 2022-08-19 DIAGNOSIS — R140 Abdominal distension (gaseous): Secondary | ICD-10-CM | POA: Diagnosis not present

## 2022-08-19 DIAGNOSIS — Z961 Presence of intraocular lens: Secondary | ICD-10-CM | POA: Diagnosis not present

## 2022-08-19 DIAGNOSIS — K219 Gastro-esophageal reflux disease without esophagitis: Secondary | ICD-10-CM | POA: Diagnosis not present

## 2022-08-19 DIAGNOSIS — R399 Unspecified symptoms and signs involving the genitourinary system: Secondary | ICD-10-CM | POA: Diagnosis not present

## 2022-08-19 DIAGNOSIS — R739 Hyperglycemia, unspecified: Secondary | ICD-10-CM | POA: Diagnosis not present

## 2022-08-19 DIAGNOSIS — S2231XD Fracture of one rib, right side, subsequent encounter for fracture with routine healing: Secondary | ICD-10-CM | POA: Diagnosis not present

## 2022-08-19 DIAGNOSIS — M810 Age-related osteoporosis without current pathological fracture: Secondary | ICD-10-CM | POA: Diagnosis not present

## 2022-08-19 DIAGNOSIS — I471 Supraventricular tachycardia: Secondary | ICD-10-CM | POA: Diagnosis not present

## 2022-08-19 DIAGNOSIS — E7800 Pure hypercholesterolemia, unspecified: Secondary | ICD-10-CM | POA: Diagnosis not present

## 2022-09-20 DIAGNOSIS — Z23 Encounter for immunization: Secondary | ICD-10-CM | POA: Diagnosis not present

## 2022-09-27 DIAGNOSIS — L301 Dyshidrosis [pompholyx]: Secondary | ICD-10-CM | POA: Diagnosis not present

## 2022-09-27 DIAGNOSIS — L820 Inflamed seborrheic keratosis: Secondary | ICD-10-CM | POA: Diagnosis not present

## 2022-09-27 DIAGNOSIS — L570 Actinic keratosis: Secondary | ICD-10-CM | POA: Diagnosis not present

## 2023-01-22 ENCOUNTER — Encounter (HOSPITAL_COMMUNITY): Payer: Self-pay | Admitting: *Deleted

## 2023-01-22 ENCOUNTER — Other Ambulatory Visit: Payer: Self-pay

## 2023-01-22 ENCOUNTER — Ambulatory Visit (HOSPITAL_COMMUNITY)
Admission: EM | Admit: 2023-01-22 | Discharge: 2023-01-22 | Disposition: A | Discharge status: Other Acute Inpatient Hospital | Payer: Medicare HMO | Attending: Family Medicine | Admitting: Family Medicine

## 2023-01-22 ENCOUNTER — Ambulatory Visit (INDEPENDENT_AMBULATORY_CARE_PROVIDER_SITE_OTHER): Payer: Medicare HMO

## 2023-01-22 DIAGNOSIS — R109 Unspecified abdominal pain: Secondary | ICD-10-CM

## 2023-01-22 LAB — COMPREHENSIVE METABOLIC PANEL
ALT: 28 U/L (ref 0–44)
AST: 23 U/L (ref 15–41)
Albumin: 4 g/dL (ref 3.5–5.0)
Alkaline Phosphatase: 63 U/L (ref 38–126)
Anion gap: 14 (ref 5–15)
BUN: 10 mg/dL (ref 8–23)
CO2: 23 mmol/L (ref 22–32)
Calcium: 9.2 mg/dL (ref 8.9–10.3)
Chloride: 102 mmol/L (ref 98–111)
Creatinine, Ser: 0.77 mg/dL (ref 0.44–1.00)
GFR, Estimated: >60 mL/min (ref >60)
Glucose, Bld: 82 mg/dL (ref 70–99)
Potassium: 3.9 mmol/L (ref 3.5–5.1)
Sodium: 139 mmol/L (ref 135–145)
Total Bilirubin: 0.6 mg/dL (ref 0.3–1.2)
Total Protein: 7 g/dL (ref 6.5–8.1)

## 2023-01-22 LAB — POCT URINALYSIS DIPSTICK, ED / UC
Bilirubin Urine: NEGATIVE
Glucose, UA: NEGATIVE mg/dL
Hgb urine dipstick: NEGATIVE
Ketones, ur: NEGATIVE mg/dL
Leukocytes,Ua: NEGATIVE
Nitrite: NEGATIVE
Protein, ur: NEGATIVE mg/dL
Specific Gravity, Urine: 1.025 (ref 1.005–1.030)
Urobilinogen, UA: 0.2 mg/dL (ref 0.0–1.0)
pH: 5.5 (ref 5.0–8.0)

## 2023-01-22 LAB — LIPASE, BLOOD: Lipase: 31 U/L (ref 11–51)

## 2023-01-22 LAB — CBC WITH DIFFERENTIAL/PLATELET
Abs Immature Granulocytes: 0.01 10*3/uL (ref 0.00–0.07)
Basophils Absolute: 0 10*3/uL (ref 0.0–0.1)
Basophils Relative: 1 %
Eosinophils Absolute: 0.2 10*3/uL (ref 0.0–0.5)
Eosinophils Relative: 3 %
HCT: 41.6 % (ref 36.0–46.0)
Hemoglobin: 14.1 g/dL (ref 12.0–15.0)
Immature Granulocytes: 0 %
Lymphocytes Relative: 26 %
Lymphs Abs: 1.7 10*3/uL (ref 0.7–4.0)
MCH: 32.2 pg (ref 26.0–34.0)
MCHC: 33.9 g/dL (ref 30.0–36.0)
MCV: 95 fL (ref 80.0–100.0)
Monocytes Absolute: 0.4 10*3/uL (ref 0.1–1.0)
Monocytes Relative: 7 %
Neutro Abs: 4.2 10*3/uL (ref 1.7–7.7)
Neutrophils Relative %: 63 %
Platelets: 279 10*3/uL (ref 150–400)
RBC: 4.38 MIL/uL (ref 3.87–5.11)
RDW: 13 % (ref 11.5–15.5)
WBC: 6.4 10*3/uL (ref 4.0–10.5)
nRBC: 0 % (ref 0.0–0.2)

## 2023-01-22 NOTE — ED Triage Notes (Signed)
Pt reports rt sided ABD pain. Pt reports she may have a UTI.

## 2023-01-22 NOTE — ED Provider Notes (Signed)
[place]    CSN: [id number] Arrival date & time: 01/22/23  1308      History   Chief Complaint Chief Complaint  Patient presents with   Abdominal Pain    HPI [facility] Mckenly Bencomo is a 72 y.o. female.   Patient presents today with a 6 to 7-day history of right-sided abdominal pain.  She reports that pain is rated 5/6 on a 0-10 scale, described as a pressure, localized to the right side of her abdomen, no alleviating factors identified.  She has not tried any over-the-counter medications for symptom management.  She reports associated nausea but denies any vomiting.  She is having normal bowel movements with last bowel movement yesterday evening which she described as normal; denies any odor or mucus in stool.  She denies previous abdominal surgery except for cesarean section and still has gallbladder and appendix.  She does have a history of UTI with similar presentation and has had some frequency that she denies urgency or dysuria.  She denies history of nephrolithiasis.  Denies any recent catheterization or urogenital procedure.  Denies any recent antibiotic use.    Past Medical History:  Diagnosis Date   Allergy    Anxiety    Arthritis    Cataract    both eyes   Depression    Depression    Phreesia 02/06/2021   GERD (gastroesophageal reflux disease)    Heart murmur    Hx of adenomatous polyp of colon 03/16/2018   Hypercholesteremia    Hyperlipidemia    Phreesia 02/06/2021   Left radial head fracture    Osteoporosis    Osteoporosis    Phreesia 02/06/2021   Tuberculosis    vaccine in [place] PPD tests positive each time/had treatment for 4 months    Patient Active Problem List   Diagnosis Date Noted   Vulvar pain 09/23/2021   PSVT (paroxysmal supraventricular tachycardia) 04/16/2019   Recurrent UTI 05/03/2017   Hx of adenomatous polyp of colon 01/10/2012   STRESS INCONTINENCE 12/22/2009   HYPERCHOLESTEROLEMIA 03/24/2009   LUNG NODULE  01/03/2009   MICROSCOPIC HEMATURIA 12/31/2008   VAGINAL MASS 12/05/2008   ANXIETY DEPRESSION 07/22/2008   Disorder of bone and cartilage 05/07/2008   DECREASED HEARING 05/06/2008   Decreased hearing 05/06/2008   GERD 10/03/2007    Past Surgical History:  Procedure Laterality Date   CESAREAN SECTION     3 births   COLONOSCOPY     POLYPECTOMY      OB History     Gravida  4   Para  3   Term  3   Preterm  0   AB  1   Living  3      SAB  1   IAB  0   Ectopic  0   Multiple  0   Live Births               Home Medications    Prior to Admission medications   Medication Sig Start Date End Date Taking? Authorizing Provider  cetirizine (ZYRTEC) 10 MG tablet Take by mouth.    [provider]  chlorhexidine (PERIDEX) 0.12 % solution  10/17/18   [provider]  Cholecalciferol (VITAMIN D3) 1000 units CAPS Take by mouth.    [provider]  cycloSPORINE (RESTASIS) 0.05 % ophthalmic emulsion Place 1 drop into both eyes 2 times daily. 10/06/20   [provider]  diltiazem (CARDIZEM CD) 120 MG 24 hr capsule Take 1  capsule (120 mg total) by mouth daily. 03/04/22 02/27/23  Cantwell, Celeste C, PA-C  diltiazem (CARDIZEM) 30 MG tablet TAKE 1 TABLET BY MOUTH AS NEEDED FOR PALPITATIONS. MAX DOSE 4 TABLETS PER 24HOUR PERIOD 03/02/21   Cantwell, Celeste C, PA-C  ibuprofen (ADVIL,MOTRIN) 800 MG tablet Take 1 tablet (800 mg total) by mouth every 8 (eight) hours as needed. 11/08/18   Shawnee Knapp, MD  meclizine (ANTIVERT) 12.5 MG tablet Take 1 tablet (12.5 mg total) by mouth 3 (three) times daily as needed for dizziness. 05/21/17   Shawnee Knapp, MD  meloxicam (MOBIC) 15 MG tablet Take 1 tablet (15 mg total) by mouth daily. 05/25/22   Hyatt, Max T, DPM  methylPREDNISolone (MEDROL DOSEPAK) 4 MG TBPK tablet 6 day dose pack - take as directed 05/25/22   Hyatt, Max T, DPM  Olopatadine HCl 0.2 % SOLN Apply 1 drop to eye daily. 11/08/18   Shawnee Knapp, MD   pantoprazole (PROTONIX) 40 MG tablet Take 1 tablet by mouth daily. 02/15/22   [provider]  pravastatin (PRAVACHOL) 40 MG tablet Take 1.5 tablets (60 mg total) by mouth daily. 02/09/21   Just, Laurita Quint, FNP  triamcinolone (KENALOG) 0.1 % Apply topically 2 (two) times daily. 01/19/21   [provider]    Family History Family History  Problem Relation Age of Onset   Heart disease Mother    Colon cancer Neg Hx    Colon polyps Neg Hx    Esophageal cancer Neg Hx    Rectal cancer Neg Hx    Stomach cancer Neg Hx     Social History Social History   Tobacco Use   Smoking status: Former    Types: Cigarettes    Quit date: 03/02/2008    Years since quitting: 14.9   Smokeless tobacco: Never  Vaping Use   Vaping Use: Never used  Substance Use Topics   Alcohol use: Yes    Alcohol/week: 2.0 standard drinks of alcohol    Types: 2 Standard drinks or equivalent per week    Comment: occational   Drug use: No     Allergies   Tramadol and Other   Review of Systems Review of Systems  Constitutional:  Positive for activity change. Negative for appetite change, fatigue and fever.  Gastrointestinal:  Positive for abdominal pain and nausea. Negative for blood in stool, constipation, diarrhea and vomiting.  Genitourinary:  Positive for frequency. Negative for dysuria, urgency, vaginal bleeding, vaginal discharge and vaginal pain.     Physical Exam Triage Vital Signs ED Triage Vitals  Enc Vitals Group     BP 01/22/23 1543 136/79     Pulse Rate 01/22/23 1543 68     Resp 01/22/23 1543 18     Temp 01/22/23 1543 98.7 F (37.1 C)     Temp src --      SpO2 01/22/23 1543 96 %     Weight --      Height --      Head Circumference --      Peak Flow --      Pain Score 01/22/23 1541 6     Pain Loc --      Pain Edu? --      Excl. in [place]? --    No data found.  Updated Vital Signs BP 136/79   Pulse 68   Temp 98.7 F (37.1 C)   Resp 18   SpO2 96%   Visual  Acuity Right Eye Distance:  Left Eye Distance:   Bilateral Distance:    Right Eye Near:   Left Eye Near:    Bilateral Near:     Physical Exam Vitals reviewed.  Constitutional:      General: She is awake. She is not in acute distress.    Appearance: Normal appearance. She is well-developed. She is not ill-appearing.     Comments: Very pleasant female appears stated age in no acute distress sitting comfortably in exam room  HENT:     Head: Normocephalic and atraumatic.     Mouth/Throat:     Mouth: Mucous membranes are moist.     Pharynx: Uvula midline. No oropharyngeal exudate or posterior oropharyngeal erythema.  Cardiovascular:     Rate and Rhythm: Normal rate and regular rhythm.     Heart sounds: Normal heart sounds, S1 normal and S2 normal. No murmur heard. Pulmonary:     Effort: Pulmonary effort is normal.     Breath sounds: Normal breath sounds. No wheezing, rhonchi or rales.     Comments: Clear to auscultation bilaterally Abdominal:     General: Bowel sounds are normal.     Palpations: Abdomen is soft.     Tenderness: There is abdominal tenderness in the right upper quadrant, right lower quadrant and epigastric area. There is guarding. There is no right CVA tenderness, left CVA tenderness or rebound. Negative signs include Murphy's sign, Rovsing's sign and psoas sign.     Comments: Tenderness palpation over right abdomen and in epigastric region with mild guarding.  Patient does have some tenderness in her right lower quadrant but has negative Rovsing and psoas sign or other evidence of acute abdomen.  Psychiatric:        Behavior: Behavior is cooperative.      UC Treatments / Results  Labs (all labs ordered are listed, but only abnormal results are displayed) Labs Reviewed  CBC WITH DIFFERENTIAL/PLATELET  COMPREHENSIVE METABOLIC PANEL  LIPASE, BLOOD  POCT URINALYSIS DIPSTICK, ED / UC    EKG   Radiology DG Abdomen 1 View  Result Date: 01/22/2023 CLINICAL  DATA:  Right-sided abdominal pain. EXAM: ABDOMEN - 1 VIEW COMPARISON:  None Available. FINDINGS: No evidence of dilated bowel loops. Moderate amount of stool is seen throughout the colon. No radiopaque calculi identified. IMPRESSION: No acute findings. Electronically Signed   By: Marlaine Hind M.D.   On: 01/22/2023 16:30    Procedures Procedures (including critical care time)  Medications Ordered in UC Medications - No data to display  Initial Impression / Assessment and Plan / UC Course  I have reviewed the triage vital signs and the nursing notes.  Pertinent labs & imaging results that were available during my care of the patient were reviewed by me and considered in my medical decision making (see chart for details).     Patient is well-appearing, afebrile, nontoxic, nontachycardic.  She did have significant tenderness on the right side of her abdomen and we discussed potential utility of going to the emergency room, however, patient declined to do this.  We discussed that we do not have CT capabilities in urgent care and this would be the most definitive way to ensure that there is not something more serious going on.  Patient continued to decline as symptoms have been stable for several days.  X-ray was obtained that showed no evidence of obstruction.  Basic labs including CBC, CMP, lipase were obtained and are pending.  UA was normal with no evidence of infection.  Recommend  that she eat a very bland diet and decrease her oral intake to allow for bowel rest in case symptoms are related to colitis.  She was encouraged to drink plenty of fluid.  Discussed that she should follow-up with GI and/or PCP first thing this week if her symptoms or not resolved for further evaluation and to arrange potential imaging.  We discussed at length that if she has any persistent/worsening symptoms including severe abdominal pain, fever, nausea, vomiting, melena, medic easy, hematemesis, weakness she needs to go to  the emergency room immediately to which she expressed understanding.  Strict return precautions given.  Final Clinical Impressions(s) / UC Diagnoses   Final diagnoses:  Right sided abdominal pain     Discharge Instructions      As we discussed, the safest thing to do is to go to the emergency room since we do not have a CT scan available in urgent care.  Your x-ray was reassuring.  Your urine was normal with no evidence of infection.  I will contact you with your blood work if anything is abnormal in the next few days.  Eat very bland and small meals.  Make sure you are drinking plenty of fluid.  Follow-up with GI as soon as possible; call them to schedule an appointment for [facility] next week.  Follow-up with your primary care first thing next week.  As we discussed, if you have any worsening or changing symptoms you need to be seen immediately including severe abdominal pain, difficulty passing bowel movements, nausea, vomiting, blood in her stool, blood in your vomit, fever.     ED Prescriptions   None    PDMP not reviewed this encounter.   Terrilee Croak, PA-C 01/22/23 1650

## 2023-01-22 NOTE — Discharge Instructions (Signed)
As we discussed, the safest thing to do is to go to the emergency room since we do not have a CT scan available in urgent care.  Your x-ray was reassuring.  Your urine was normal with no evidence of infection.  I will contact you with your blood work if anything is abnormal in the next few days.  Eat very bland and small meals.  Make sure you are drinking plenty of fluid.  Follow-up with GI as soon as possible; call them to schedule an appointment for [facility] next week.  Follow-up with your primary care first thing next week.  As we discussed, if you have any worsening or changing symptoms you need to be seen immediately including severe abdominal pain, difficulty passing bowel movements, nausea, vomiting, blood in her stool, blood in your vomit, fever.

## 2023-01-26 ENCOUNTER — Telehealth: Payer: Self-pay | Admitting: Internal Medicine

## 2023-01-26 ENCOUNTER — Encounter: Payer: Self-pay | Admitting: Internal Medicine

## 2023-01-26 ENCOUNTER — Ambulatory Visit (HOSPITAL_COMMUNITY)
Admission: RE | Admit: 2023-01-26 | Discharge: 2023-01-26 | Disposition: A | Discharge status: Home / Self Care | Payer: Medicare HMO | Source: Ambulatory Visit | Attending: Internal Medicine | Admitting: Internal Medicine

## 2023-01-26 ENCOUNTER — Ambulatory Visit: Payer: Medicare HMO | Admitting: Internal Medicine

## 2023-01-26 VITALS — BP 120/68 | HR 73 | Ht 65.0 in | Wt 170.0 lb

## 2023-01-26 DIAGNOSIS — R1031 Right lower quadrant pain: Secondary | ICD-10-CM

## 2023-01-26 DIAGNOSIS — R10813 Right lower quadrant abdominal tenderness: Secondary | ICD-10-CM

## 2023-01-26 MED ORDER — AMOXICILLIN-POT CLAVULANATE 875-125 MG PO TABS: 1.0000 | ORAL_TABLET | Freq: Two times a day (BID) | ORAL | 0 refills | Status: DC

## 2023-01-26 MED ORDER — IOHEXOL 350 MG/ML SOLN
75.0000 mL | Freq: Once | INTRAVENOUS | Status: AC | PRN
  Administered 2023-01-26: 75 mL via INTRAVENOUS

## 2023-01-26 NOTE — Telephone Encounter (Signed)
I did reach her and explained that she had possible diverticulitis and we will treat with antibiotics.  I told her to contact me if not improving in about 5 days.  We need to arrange a follow-up visit with me I think next available is okay.

## 2023-01-26 NOTE — Telephone Encounter (Signed)
CT scan today demonstrates suspected diverticulitis versus other inflammatory condition in the right colon area.  I will treat with Augmentin generic as below.  I will keep trying to contact the patient.  Meds ordered this encounter  Medications   amoxicillin-clavulanate (AUGMENTIN) 875-125 MG tablet    Sig: Take 1 tablet by mouth 2 (two) times daily.    Dispense:  20 tablet    Refill:  0

## 2023-01-26 NOTE — Patient Instructions (Signed)
You have been scheduled for a CT scan of the abdomen and pelvis at [place] are scheduled on 01/26/2023 at 4:00pm. You should arrive 15 minutes prior to your appointment time for registration. 1) Do not eat anything after 12:00pm (4 hours prior to your test) arrive at 3:30pm.   You may take any medications as prescribed with a small amount of water, if necessary. If you take any of the following medications: METFORMIN, GLUCOPHAGE, [place], AVANDAMET, RIOMET, FORTAMET, [place] MET, JANUMET, GLUMETZA or METAGLIP, you MAY be asked to HOLD this medication 48 hours AFTER the exam.   If you have any questions regarding your exam or if you need to reschedule, you may call [facility] at [phone] between the hours of 8:00 am and 5:00 pm, Monday-Friday.   I appreciate the opportunity to care for you. Silvano Rusk, MD, [facility]

## 2023-01-26 NOTE — Progress Notes (Signed)
[address] Lisa Costa 72 y.o. Feb 07, 1951 [id number]  Assessment & Plan:   Encounter Diagnoses  Name Primary?   Right lower quadrant abdominal tenderness without rebound tenderness Yes   RLQ abdominal pain    I am concerned she could be dealing with appendicitis given the persistence and the increase in physical exam findings today.  I think she should have a CT scan today to rule this out.   Subjective:   Chief Complaint: Right lower quadrant pain  HPI 72 year old Hispanic woman who started having right lower quadrant pain about 8 days ago.  She had traveled to [place] and I think it might of started there, visiting family returned home.  She has had a persistent ache which has been increasing.  She has been to 2 different urgent cares, laboratory testing is unrevealing (normal CBC) though urinalysis had some slight abnormalities.  No urinary symptoms.  No history of diverticulosis or diverticulitis.  She had plain film performed which was unrevealing.  No back pain.  She says her bowel movements are a little bit smaller but no bleeding.  No diarrhea or constipation.  Eating does not seem to aggravate the pain neither is defecation change.  No fever nausea vomiting.  She is sleeping without problems at this point despite the pain.  Colonoscopy 03/16/2018 - One 2 to 3 mm polyp in the transverse colon, removed with a cold biopsy forceps. Resected and retrieved. -Hyperplastic polyp the examination was otherwise normal on direct and retroflexion views. - Personal history of colonic polyp - diminutive adenoma 2013  Allergies  Allergen Reactions   Tramadol Nausea And Vomiting   Other    Current Meds  Medication Sig   cetirizine (ZYRTEC) 10 MG tablet Take by mouth.   chlorhexidine (PERIDEX) 0.12 % solution    Cholecalciferol (VITAMIN D3) 1000 units CAPS Take by mouth.   cycloSPORINE (RESTASIS) 0.05 % ophthalmic emulsion Place 1 drop into both eyes 2 times daily.   diltiazem  (CARDIZEM CD) 120 MG 24 hr capsule Take 1 capsule (120 mg total) by mouth daily.   diltiazem (CARDIZEM) 30 MG tablet TAKE 1 TABLET BY MOUTH AS NEEDED FOR PALPITATIONS. MAX DOSE 4 TABLETS PER 24HOUR PERIOD   ibuprofen (ADVIL,MOTRIN) 800 MG tablet Take 1 tablet (800 mg total) by mouth every 8 (eight) hours as needed.   meclizine (ANTIVERT) 12.5 MG tablet Take 1 tablet (12.5 mg total) by mouth 3 (three) times daily as needed for dizziness.   meloxicam (MOBIC) 15 MG tablet Take 1 tablet (15 mg total) by mouth daily.   methylPREDNISolone (MEDROL DOSEPAK) 4 MG TBPK tablet 6 day dose pack - take as directed   Olopatadine HCl 0.2 % SOLN Apply 1 drop to eye daily.   pantoprazole (PROTONIX) 40 MG tablet Take 1 tablet by mouth daily.   pravastatin (PRAVACHOL) 40 MG tablet Take 1.5 tablets (60 mg total) by mouth daily.   triamcinolone (KENALOG) 0.1 % Apply topically 2 (two) times daily.   Past Medical History:  Diagnosis Date   Allergy    Anxiety    Arthritis    Cataract    both eyes   Depression    Depression    Phreesia 02/06/2021   GERD (gastroesophageal reflux disease)    Heart murmur    Hx of adenomatous polyp of colon 03/16/2018   Hypercholesteremia    Hyperlipidemia    Phreesia 02/06/2021   Left radial head fracture    Osteoporosis    Osteoporosis  Phreesia 02/06/2021   Tuberculosis    vaccine in [place] PPD tests positive each time/had treatment for 4 months   Past Surgical History:  Procedure Laterality Date   CESAREAN SECTION     3 births   COLONOSCOPY     POLYPECTOMY     Social History   She is from [place] she is single has 3 children at least 1 remains in [place] so does her mother Former smoker no alcohol (maybe rare) no drug use family history includes Heart disease in her mother.   Review of Systems As per HPI  Objective:   Physical Exam BP 120/68   Pulse 73   Ht '5\' 5"'$  (1.651 m)   Wt 170 lb (77.1 kg)   SpO2 98%   BMI 28.29 kg/m  NAD Abd soft, BS + mod  tender in RLQ - psoas sign but ? Mildly + Rovsing sign

## 2023-01-27 NOTE — Telephone Encounter (Signed)
Through a [place] interpreter. Pt was left a message to call back

## 2023-01-28 NOTE — Telephone Encounter (Signed)
Pt made aware of Dr. Carlean Purl recommendations: Pt was scheduled for an office visit on 03/01/2023 at 8:50 AM with Dr. Carlean Purl. Pt made aware Pt stated that she is going to pick up her antibiotics today.  Pt verbalized understanding with all questions answered.

## 2023-02-09 MED ORDER — AMOXICILLIN-POT CLAVULANATE 875-125 MG PO TABS: 1.0000 | ORAL_TABLET | Freq: Two times a day (BID) | ORAL | 0 refills | Status: DC

## 2023-02-09 NOTE — Addendum Note (Signed)
Addended by: Silvano Rusk E on: 02/09/2023 01:11 PM   Modules accepted: Orders

## 2023-02-09 NOTE — Telephone Encounter (Signed)
I have reordered her antibiotics to be taken again.  She should update [place] with response to this repeat treatment regimen when completed orsooner if worsening

## 2023-02-09 NOTE — Telephone Encounter (Signed)
Pt made aware of Dr. Carlean Purl recommendations: Pt notified that prescription has been sent to pharmacy: Pt notified to update [place] with response to this repeat treatment regimen when completed or sooner if worsening  Pt verbalized understanding with all questions answered.

## 2023-02-09 NOTE — Telephone Encounter (Signed)
Recent office visit 01/26/2023  Pt stated that she finished her antibiotics and that she is still in pain. Right abdominal mild pain with some bloating. BM are more regular now. 3 BM yesterday that are formed and regular. No nausea. No Fever. Slight headache that she thinks is coming from antibiotics. Please advise

## 2023-02-09 NOTE — Telephone Encounter (Signed)
PT finished antibiotic regimen and wanted to advise [place] that she is still in pain. Please advise

## 2023-02-24 ENCOUNTER — Other Ambulatory Visit: Payer: Self-pay

## 2023-02-24 DIAGNOSIS — R002 Palpitations: Secondary | ICD-10-CM

## 2023-02-24 MED ORDER — DILTIAZEM HCL 30 MG PO TABS: ORAL_TABLET | ORAL | 0 refills | Status: DC

## 2023-02-24 MED ORDER — DILTIAZEM HCL ER COATED BEADS 120 MG PO CP24: 120.0000 mg | ORAL_CAPSULE | Freq: Every day | ORAL | 0 refills | Status: DC

## 2023-03-01 ENCOUNTER — Ambulatory Visit: Payer: Medicare HMO | Admitting: Internal Medicine

## 2023-03-01 ENCOUNTER — Encounter: Payer: Self-pay | Admitting: Internal Medicine

## 2023-03-01 VITALS — BP 124/68 | HR 64 | Ht 64.0 in | Wt 174.6 lb

## 2023-03-01 DIAGNOSIS — K769 Liver disease, unspecified: Secondary | ICD-10-CM | POA: Diagnosis not present

## 2023-03-01 DIAGNOSIS — K5732 Diverticulitis of large intestine without perforation or abscess without bleeding: Secondary | ICD-10-CM

## 2023-03-01 NOTE — Progress Notes (Signed)
[address] Lisa Costa 72 y.o. 04-Nov-1951 [id number]  Assessment & Plan:   Encounter Diagnoses  Name Primary?   Diverticulitis of colon suspected Yes   Liver lesions - cyct vs hemangiomas    This inflammatory process in the right colon suspected to be diverticulitis has resolved.  I did recommend a colonoscopy to evaluate for polyp or cancer that could be a trigger though very rare it is possible.  She declined the colonoscopy and understands the risks.  She is concerned about paying for her healthcare costs recently.  She promises to contact me if she has recurrent issues.  I have reviewed diverticulitis and given her handout and explained that I think the tiny liver lesion she has are inconsequential based upon what we know.  CC: Just, Laurita Quint, FNP (Inactive)     Subjective:   Chief Complaint: Follow-up of abdominal pain  HPI Lisa Costa is here for follow-up after she was treated for suspected diverticulitis found by CT scanning x 2 in February.  Her pain is really gone and might be slightly tender.  She feels better other than some slight bloating.  She was treated with Augmentin with 210-day courses.  CT Abdomen Pelvis W Contrast CLINICAL DATA:  Right lower quadrant abdominal pain and tenderness  IMPRESSION: 1. Abnormal 2.0 by 1.4 cm focus of probable inflammation along the anteromedial margin of the ascending colon with faint marginal stranding. Given the right-sided abdominal pain, this is most likely an inflamed diverticulum or focal reaction to inflammation along the margin of the ascending colon. Strictly speaking, a small tumor along the colon wall and adjacent medial mesocolon is a possibility. Correlate with patient's colon cancer screening history. If screening is not up-to-date, appropriate screening should be considered following resolution of the patient's acute symptoms. 2. Normal appendix. 3. 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 No current hydronephrosis or hydroureter. 4. 1.7 by 1.4 cm homogeneous hypodense lesion in segment 4a of the liver demonstrates less conspicuous margins on delayed phase images but the lesion is blurred by motion artifact on those images. Statistically this is most likely to be a cyst or hemangioma. If the patient has abnormal liver enzymes or if otherwise clinically warranted this could be definitively characterized with hepatic protocol MRI with and without contrast. 5. Chronic appearing superior endplate compressions at T11 and L3. Mild lumbar spondylosis and degenerative disc disease. 6. Aortic atherosclerosis.  Aortic Atherosclerosis (ICD10-I70.0).  Electronically Signed   By: Van Clines M.D.   On: 01/26/2023 16:30   Colonoscopy 03/16/2018 - One 2 to 3 mm polyp in the transverse colon, removed with a cold biopsy forceps. Resected and retrieved. -Hyperplastic polyp the examination was otherwise normal on direct and retroflexion views. - Personal history of colonic polyp - diminutive adenoma 2013  Current Meds  Medication Sig   cetirizine (ZYRTEC) 10 MG tablet Take by mouth.   chlorhexidine (PERIDEX) 0.12 % solution    Cholecalciferol (VITAMIN D3) 1000 units CAPS Take by mouth.   diltiazem (CARDIZEM CD) 120 MG 24 hr capsule Take 1 capsule (120 mg total) by mouth daily.   diltiazem (CARDIZEM) 30 MG tablet TAKE 1 TABLET BY MOUTH AS NEEDED FOR PALPITATIONS. MAX DOSE 4 TABLETS PER 24HOUR PERIOD   ibuprofen (ADVIL,MOTRIN) 800 MG tablet Take 1 tablet (800 mg total) by mouth every 8 (eight) hours as needed.   meclizine (ANTIVERT) 12.5 MG tablet Take 1  tablet (12.5 mg total) by mouth 3 (three) times daily as needed for dizziness.   meloxicam (MOBIC) 15 MG tablet Take 1 tablet (15 mg total) by mouth daily.   Olopatadine HCl 0.2 % SOLN Apply 1 drop to eye daily.   pantoprazole (PROTONIX) 40 MG tablet Take  1 tablet by mouth daily.   pravastatin (PRAVACHOL) 40 MG tablet Take 1.5 tablets (60 mg total) by mouth daily.   triamcinolone (KENALOG) 0.1 % Apply topically 2 (two) times daily.   Past Medical History:  Diagnosis Date   Allergy    Anxiety    Arthritis    Cataract    both eyes   Depression    Depression    Phreesia 02/06/2021   GERD (gastroesophageal reflux disease)    Heart murmur    Hx of adenomatous polyp of colon 03/16/2018   Hypercholesteremia    Hyperlipidemia    Phreesia 02/06/2021   Left radial head fracture    Osteoporosis    Osteoporosis    Phreesia 02/06/2021   Tuberculosis    vaccine in [place] PPD tests positive each time/had treatment for 4 months   Past Surgical History:  Procedure Laterality Date   CESAREAN SECTION     3 births   COLONOSCOPY     POLYPECTOMY     Social History   Social History Narrative   Patient is single, originally from [place]   She still travels there to visit family at times   She works as a Education officer, 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   Review of Systems As above  Objective:   Physical Exam BP 124/68   Pulse 64   Ht 5\' 4"  (1.626 m)   Wt 174 lb 9.6 oz (79.2 kg)   BMI 29.97 kg/m

## 2023-03-01 NOTE — Patient Instructions (Addendum)
If you have any issues in the future please reach out to [place].  We are giving you a handout on Diverticulosis.   _______________________________________________________  If your blood pressure at your visit was 140/90 or greater, please contact your primary care physician to follow up on this.  _______________________________________________________  If you are age 73 or older, your body mass index should be between 23-30. Your Body mass index is 29.97 kg/m. If this is out of the aforementioned range listed, please consider follow up with your Primary Care Provider.  If you are age 67 or younger, your body mass index should be between 19-25. Your Body mass index is 29.97 kg/m. If this is out of the aformentioned range listed, please consider follow up with your Primary Care Provider.   ________________________________________________________  The [facility] providers would like to encourage you to use [facility] to communicate with providers for non-urgent requests or questions.  Due to long hold times on the telephone, sending your provider a message by [facility] may be a faster and more efficient way to get a response.  Please allow 48 business hours for a response.  Please remember that this is for non-urgent requests.  _______________________________________________________ It was a pleasure to see you today!  Thank you for trusting me with your gastrointestinal care!

## 2023-04-28 ENCOUNTER — Ambulatory Visit: Payer: Medicare HMO | Admitting: Student

## 2023-04-28 ENCOUNTER — Ambulatory Visit: Payer: Medicare HMO | Admitting: Cardiology

## 2023-04-28 ENCOUNTER — Encounter: Payer: Self-pay | Admitting: Cardiology

## 2023-04-28 VITALS — BP 135/64 | HR 69 | Resp 16 | Ht 64.0 in | Wt 177.0 lb

## 2023-04-28 DIAGNOSIS — I471 Supraventricular tachycardia, unspecified: Secondary | ICD-10-CM

## 2023-04-28 DIAGNOSIS — E782 Mixed hyperlipidemia: Secondary | ICD-10-CM

## 2023-04-28 DIAGNOSIS — R002 Palpitations: Secondary | ICD-10-CM | POA: Insufficient documentation

## 2023-04-28 DIAGNOSIS — R072 Precordial pain: Secondary | ICD-10-CM | POA: Insufficient documentation

## 2023-04-28 DIAGNOSIS — R0609 Other forms of dyspnea: Secondary | ICD-10-CM | POA: Insufficient documentation

## 2023-04-28 NOTE — Progress Notes (Signed)
Follow up visit  Subjective:   Lisa Costa, female    DOB: [birth year], 72 y.o.   MRN: [id number]  HPI  Chief Complaint  Patient presents with   Hyperlipidemia   PSVT   Follow-up    1 year    72 y.o. Hispanic female with PSVT, hyperlipidemia, prediabetes, aortic atherosclerosis with exertional dyspnea, precordial pain  Patient was seen once by me in 2021.  More recently, she was followed annyally by PA in our office.  Recently, patient has experienced constant precordial dull pain, exertional dyspnea with minimal activity such as walking 15 feet while holding her great grandchild, as well as increased heart rate with minimal activity.    Current Outpatient Medications:    cetirizine (ZYRTEC) 10 MG tablet, Take by mouth., Disp: , Rfl:    chlorhexidine (PERIDEX) 0.12 % solution, , Disp: , Rfl: 0   Cholecalciferol (VITAMIN D3) 1000 units CAPS, Take by mouth., Disp: , Rfl:    diltiazem (CARDIZEM CD) 120 MG 24 hr capsule, Take 1 capsule (120 mg total) by mouth daily., Disp: 90 capsule, Rfl: 0   diltiazem (CARDIZEM) 30 MG tablet, TAKE 1 TABLET BY MOUTH AS NEEDED FOR PALPITATIONS. MAX DOSE 4 TABLETS PER 24HOUR PERIOD, Disp: 90 tablet, Rfl: 0   ibuprofen (ADVIL,MOTRIN) 800 MG tablet, Take 1 tablet (800 mg total) by mouth every 8 (eight) hours as needed., Disp: 270 tablet, Rfl: 0   meclizine (ANTIVERT) 12.5 MG tablet, Take 1 tablet (12.5 mg total) by mouth 3 (three) times daily as needed for dizziness., Disp: 30 tablet, Rfl: 6   meloxicam (MOBIC) 15 MG tablet, Take 1 tablet (15 mg total) by mouth daily., Disp: 30 tablet, Rfl: 3   Olopatadine HCl 0.2 % SOLN, Apply 1 drop to eye daily., Disp: 1 Bottle, Rfl: 3   pantoprazole (PROTONIX) 40 MG tablet, Take 1 tablet by mouth daily., Disp: , Rfl:    pravastatin (PRAVACHOL) 40 MG tablet, Take 1.5 tablets (60 mg total) by mouth daily., Disp: 90 tablet, Rfl: 3   triamcinolone (KENALOG) 0.1 %, Apply topically 2 (two) times daily.,  Disp: , Rfl:    Cardiovascular & other pertient studies:  Reviewed external labs and tests, independently interpreted  EKG 04/28/2023: Sinus rhythm 62 bpm  Anteroseptal infarct -age undetermined   Recent labs: 02/23/2023: Glucose 107, BUN/Cr 18/0.83. EGFR 74. Na/K 140/4.6. Rest of the CMP normal H/H 13/41. MCV 95. Platelets 241 HbA1C 6.0% Chol 174, TG 97, HDL 56, LDL 99 TSH 1.2 normal   01/22/2023: Glucose 82, BUN/Cr 10/0.77. EGFR >60. Na/K 139/3.9. Rest of the CMP normal H/H 14/41. MCV 95. Platelets 279   Review of Systems  Cardiovascular:  Positive for chest pain, dyspnea on exertion and palpitations. Negative for leg swelling and syncope.        Vitals:   04/28/23 1021  BP: 135/64  Pulse: 69  Resp: 16  SpO2: 96%    Body mass index is 30.38 kg/m. Filed Weights   04/28/23 1021  Weight: 177 lb (80.3 kg)    Objective:   Physical Exam Vitals and nursing note reviewed.  Constitutional:      General: She is not in acute distress. Neck:     Vascular: No JVD.  Cardiovascular:     Rate and Rhythm: Normal rate and regular rhythm.     Heart sounds: Normal heart sounds. No murmur heard. Pulmonary:     Effort: Pulmonary effort is normal.     Breath sounds: Normal breath  sounds. No wheezing or rales.  Musculoskeletal:     Right lower leg: No edema.     Left lower leg: No edema.             Visit diagnoses:   ICD-10-CM   1. PSVT (paroxysmal supraventricular tachycardia)  I47.10 EKG 12-Lead    2. Mixed hyperlipidemia  E78.2     3. Precordial pain  R07.2 PCV MYOCARDIAL PERFUSION WO LEXISCAN    PCV ECHOCARDIOGRAM COMPLETE    CT CARDIAC SCORING (SELF PAY ONLY)    4. Palpitations  R00.2 LONG TERM MONITOR (3-14 DAYS)    5. Dyspnea on exertion  R06.09 PCV ECHOCARDIOGRAM COMPLETE       No orders of the defined types were placed in this encounter.    Medication changes this visit: There are no discontinued medications.  No orders of the defined  types were placed in this encounter.    Assessment & Recommendations:   72 y.o. Hispanic female with PSVT, hyperlipidemia, prediabetes, aortic atherosclerosis with exertional dyspnea, precordial pain  Dyspnea is exertional, chest pain is nonspecific and present even at rest without any EKG abnormalities to suggest resting ischemia.  Recommend exercise nuclear stress test, echocardiogram, CT cardiac scoring.  With known PSVT, also recommend to be cardiac telemetry.  F/u after tests       Elder Negus, MD Pager: [phone] Office: [phone]

## 2023-05-16 ENCOUNTER — Ambulatory Visit: Payer: Medicare HMO

## 2023-05-16 DIAGNOSIS — R072 Precordial pain: Secondary | ICD-10-CM

## 2023-05-20 ENCOUNTER — Ambulatory Visit (HOSPITAL_COMMUNITY)
Admission: RE | Admit: 2023-05-20 | Discharge: 2023-05-20 | Disposition: A | Discharge status: Home / Self Care | Payer: Medicare HMO | Source: Ambulatory Visit | Attending: Cardiology | Admitting: Cardiology

## 2023-05-20 DIAGNOSIS — R072 Precordial pain: Secondary | ICD-10-CM | POA: Insufficient documentation

## 2023-05-23 ENCOUNTER — Other Ambulatory Visit: Payer: Self-pay | Admitting: Cardiology

## 2023-05-23 DIAGNOSIS — R002 Palpitations: Secondary | ICD-10-CM

## 2023-05-24 ENCOUNTER — Other Ambulatory Visit: Payer: Medicare HMO

## 2023-05-26 ENCOUNTER — Other Ambulatory Visit: Payer: Medicare HMO

## 2023-05-26 ENCOUNTER — Ambulatory Visit: Payer: Medicare HMO

## 2023-05-26 DIAGNOSIS — R002 Palpitations: Secondary | ICD-10-CM

## 2023-05-26 DIAGNOSIS — R0609 Other forms of dyspnea: Secondary | ICD-10-CM

## 2023-05-26 DIAGNOSIS — R072 Precordial pain: Secondary | ICD-10-CM

## 2023-06-17 ENCOUNTER — Other Ambulatory Visit: Payer: Self-pay | Admitting: Cardiology

## 2023-06-17 DIAGNOSIS — R002 Palpitations: Secondary | ICD-10-CM

## 2023-06-20 ENCOUNTER — Encounter: Payer: Self-pay | Admitting: Cardiology

## 2023-06-20 ENCOUNTER — Ambulatory Visit: Payer: Medicare HMO | Admitting: Cardiology

## 2023-06-20 VITALS — BP 123/64 | HR 67 | Resp 16 | Ht 64.0 in | Wt 173.0 lb

## 2023-06-20 DIAGNOSIS — E782 Mixed hyperlipidemia: Secondary | ICD-10-CM

## 2023-06-20 DIAGNOSIS — I471 Supraventricular tachycardia, unspecified: Secondary | ICD-10-CM

## 2023-06-20 MED ORDER — DILTIAZEM HCL ER COATED BEADS 120 MG PO CP24: 120.0000 mg | ORAL_CAPSULE | Freq: Every day | ORAL | 3 refills | Status: DC

## 2023-06-20 MED ORDER — ROSUVASTATIN CALCIUM 10 MG PO TABS: 10.0000 mg | ORAL_TABLET | Freq: Every day | ORAL | 3 refills | Status: DC

## 2023-06-20 NOTE — Progress Notes (Signed)
Follow up visit  Subjective:   Lisa Costa, female    DOB: 15-May-1951, 72 y.o.   MRN: [id number]  HPI  Chief Complaint  Patient presents with   PSVT   Follow-up    8 weeks    72 y.o. Hispanic female with PSVT, hyperlipidemia, prediabetes, aortic atherosclerosis with exertional dyspnea, precordial pain  Patient has had couple episodes of rapid heart rate recently. Reviewed recent test results with the patient, details below.     Current Outpatient Medications:    cetirizine (ZYRTEC) 10 MG tablet, Take by mouth., Disp: , Rfl:    chlorhexidine (PERIDEX) 0.12 % solution, , Disp: , Rfl: 0   Cholecalciferol (VITAMIN D3) 1000 units CAPS, Take by mouth., Disp: , Rfl:    diltiazem (CARDIZEM CD) 120 MG 24 hr capsule, Take 1 capsule by mouth once daily, Disp: 90 capsule, Rfl: 0   diltiazem (CARDIZEM) 30 MG tablet, TAKE 1 TABLET BY MOUTH AS NEEDED FOR  PALPITATIONS.  MAX  DOSE  OF  4  TABLETS  PER  24  HOUR  PERIOD, Disp: 90 tablet, Rfl: 0   ibuprofen (ADVIL,MOTRIN) 800 MG tablet, Take 1 tablet (800 mg total) by mouth every 8 (eight) hours as needed., Disp: 270 tablet, Rfl: 0   meclizine (ANTIVERT) 12.5 MG tablet, Take 1 tablet (12.5 mg total) by mouth 3 (three) times daily as needed for dizziness., Disp: 30 tablet, Rfl: 6   meloxicam (MOBIC) 15 MG tablet, Take 1 tablet (15 mg total) by mouth daily., Disp: 30 tablet, Rfl: 3   pantoprazole (PROTONIX) 40 MG tablet, Take 1 tablet by mouth daily., Disp: , Rfl:    pravastatin (PRAVACHOL) 40 MG tablet, Take 1.5 tablets (60 mg total) by mouth daily., Disp: 90 tablet, Rfl: 3   triamcinolone (KENALOG) 0.1 %, Apply topically 2 (two) times daily., Disp: , Rfl:    Cardiovascular & other pertient studies:  Reviewed external labs and tests, independently interpreted  EKG 04/28/2023: Sinus rhythm 62 bpm  Anteroseptal infarct -age undetermined  Mobile cardiac telemetry 13 days 05/26/2023 - 06/09/2023: Dominant rhythm: Sinus. HR 46-129  bpm. Avg HR 71 bpm. 57 episodes of SVT, fastest at 190 bpm for 8.2 secs, longest for 13.6 secs at 121 bpm. SVT was detected within +/- 45 seconds of symptomatic patient event(s). <1% isolated SVE, couplet/triplets. 0 episodes of VT. <1% isolated VE, couplet/triplets. No atrial fibrillation/atrial flutter/VT/high grade AV block, sinus pause >3sec noted. 13 patient triggered events, many correlated with SVE, SVT.   Echocardiogram 05/26/2023:  Normal LV systolic function with visual EF 60-65%. Left ventricle cavity  is normal in size. Normal left ventricular wall thickness. Normal global  wall motion. Normal diastolic filling pattern, normal LAP.  Aortic valve sclerosis without stenosis. Trace aortic regurgitation.  Mild (Grade I) mitral regurgitation.  Mild tricuspid regurgitation. No evidence of pulmonary hypertension.  No prior study for comparison.   Exercise nuclear stress test 05/16/2023: Myocardial perfusion is normal. Overall LV systolic function is normal without regional wall motion abnormalities. Stress LV EF: 61%.  Normal ECG stress. The patient exercised for 4 minutes and 40 seconds of a Bruce protocol, achieving approximately 6.62 METs & 95% MPHR. Exercise capacity reduced.  The blood pressure response was normal. No chest pain.  No previous exam available for comparison. Low risk.     CT cardiac scoring 05/20/2023: LM: 0 LAD: 12 Lcx: 0 RCA: 0 Total score: 12 (52nd percentile)  Hepatic steatosis. Aortic Atherosclerosis   Recent labs: 02/23/2023:  Glucose 107, BUN/Cr 18/0.83. EGFR 74. Na/K 140/4.6. Rest of the CMP normal H/H 13/41. MCV 95. Platelets 241 HbA1C 6.0% Chol 174, TG 97, HDL 56, LDL 99 TSH 1.2 normal   01/22/2023: Glucose 82, BUN/Cr 10/0.77. EGFR >60. Na/K 139/3.9. Rest of the CMP normal H/H 14/41. MCV 95. Platelets 279   Review of Systems  Cardiovascular:  Positive for palpitations. Negative for chest pain, dyspnea on exertion, leg swelling and  syncope.        Vitals:   06/20/23 1058  BP: 123/64  Pulse: 67  Resp: 16  SpO2: 96%     Body mass index is 29.7 kg/m. Filed Weights   06/20/23 1058  Weight: 173 lb (78.5 kg)     Objective:   Physical Exam Vitals and nursing note reviewed.  Constitutional:      General: She is not in acute distress. Neck:     Vascular: No JVD.  Cardiovascular:     Rate and Rhythm: Normal rate and regular rhythm.     Heart sounds: Normal heart sounds. No murmur heard. Pulmonary:     Effort: Pulmonary effort is normal.     Breath sounds: Normal breath sounds. No wheezing or rales.  Musculoskeletal:     Right lower leg: No edema.     Left lower leg: No edema.            Visit diagnoses:   ICD-10-CM   1. PSVT (paroxysmal supraventricular tachycardia)  I47.10     2. Mixed hyperlipidemia  E78.2 Lipid panel    Lipid panel        Orders Placed This Encounter  Procedures   Lipid panel     Medication changes this visit: Medications Discontinued During This Encounter  Medication Reason   pravastatin (PRAVACHOL) 40 MG tablet Change in therapy   diltiazem (CARDIZEM CD) 120 MG 24 hr capsule Reorder    Meds ordered this encounter  Medications   diltiazem (CARDIZEM CD) 120 MG 24 hr capsule    Sig: Take 1 capsule (120 mg total) by mouth daily.    Dispense:  90 capsule    Refill:  3   rosuvastatin (CRESTOR) 10 MG tablet    Sig: Take 1 tablet (10 mg total) by mouth daily.    Dispense:  90 tablet    Refill:  3     Assessment & Recommendations:   72 y.o. Hispanic female with PSVT, hyperlipidemia, prediabetes, aortic atherosclerosis with exertional dyspnea, precordial pain  Precordial pain: Improved. Structurally normal heart. No ischemia on stress testing.  PSVT: Discussed vagal maneuvers.  Continue diltiazem 120 mg daily, and 30 mg prn, in addition.  Elevated coronary calcium score, hyperlipidemia: Mildly elevated calcium score of 12.  Change pravastatin to  Crestor 10 mg daily (low dose recommended given hepatic steatosis). Repeat lipid panel in 3 months.  F/u in 3 months    Elder Negus, MD Pager: [phone] Office: [phone]

## 2023-09-07 LAB — LIPID PANEL
Chol/HDL Ratio: 2.8 {ratio} (ref 0.0–4.4)
Cholesterol, Total: 150 mg/dL (ref 100–199)
HDL: 54 mg/dL (ref >39)
LDL Chol Calc (NIH): 76 mg/dL (ref 0–99)
Triglycerides: 111 mg/dL (ref 0–149)
VLDL Cholesterol Cal: 20 mg/dL (ref 5–40)

## 2023-09-21 ENCOUNTER — Ambulatory Visit: Payer: Medicare HMO | Admitting: Cardiology

## 2023-09-22 ENCOUNTER — Ambulatory Visit: Payer: Medicare HMO | Admitting: Cardiology

## 2023-09-23 ENCOUNTER — Ambulatory Visit: Payer: Medicare HMO | Attending: Cardiology | Admitting: Cardiology

## 2023-09-23 ENCOUNTER — Encounter: Payer: Self-pay | Admitting: Cardiology

## 2023-09-23 VITALS — BP 110/70 | Resp 15 | Ht 64.0 in | Wt 173.0 lb

## 2023-09-23 DIAGNOSIS — E782 Mixed hyperlipidemia: Secondary | ICD-10-CM | POA: Diagnosis not present

## 2023-09-23 DIAGNOSIS — I471 Supraventricular tachycardia, unspecified: Secondary | ICD-10-CM

## 2023-09-23 DIAGNOSIS — I700 Atherosclerosis of aorta: Secondary | ICD-10-CM | POA: Diagnosis not present

## 2023-09-23 MED ORDER — DILTIAZEM HCL ER COATED BEADS 120 MG PO CP24: 120.0000 mg | ORAL_CAPSULE | Freq: Every day | ORAL | 3 refills | Status: DC

## 2023-09-23 MED ORDER — ROSUVASTATIN CALCIUM 10 MG PO TABS: 10.0000 mg | ORAL_TABLET | Freq: Every day | ORAL | 3 refills | Status: DC

## 2023-09-23 NOTE — Patient Instructions (Signed)
 Medication Instructions:   Your physician recommends that you continue on your current medications as directed. Please refer to the Current Medication list given to you today.  *If you need a refill on your cardiac medications before your next appointment, please call your pharmacy*    Follow-Up: At [facility], you and your health needs are our priority.  As part of our continuing mission to provide you with exceptional heart care, we have created designated Provider Care Teams.  These Care Teams include your primary Cardiologist (physician) and Advanced Practice Providers (APPs -  Physician Assistants and Nurse Practitioners) who all work together to provide you with the care you need, when you need it.  We recommend signing up for the patient portal called "MyChart".  Sign up information is provided on this After Visit Summary.  MyChart is used to connect with patients for Virtual Visits (Telemedicine).  Patients are able to view lab/test results, encounter notes, upcoming appointments, etc.  Non-urgent messages can be sent to your provider as well.   To learn more about what you can do with MyChart, go to [email or web address].    Your next appointment:   1 year(s)  Provider:   DR. Rosemary Holms

## 2023-09-23 NOTE — Progress Notes (Signed)
Cardiology Office Note:  .   Date:  09/23/2023  ID:  Lisa Costa, DOB 07/26/1951, MRN [id number] PCP: Richmond Campbell PA-C  [facility] Providers Cardiologist:  Truett Mainland, MD PCP: Richmond Campbell., PA-C  Chief Complaint  Patient presents with   PSVT   Hyperlipidemia   Follow-up   Results    labs      History of Present Illness: .    Lisa Costa is a 72 y.o. female with PSVT, hyperlipidemia, prediabetes, aortic atherosclerosis   Patient is doing well, denies chest pain, shortness of breath, leg edema, orthopnea, PND, TIA/syncope. Palpitations are occasional.   Reviewed recent lipid panel results with the patient, details below.   Vitals:   09/23/23 1005  BP: 110/70  Resp: 15  SpO2: 98%     ROS:  Review of Systems  Cardiovascular:  Positive for palpitations. Negative for chest pain, dyspnea on exertion, leg swelling and syncope.     Studies Reviewed: Marland Kitchen       Labs 09/06/2023: Chol 150, TG 111, HDL 54, LDL 76  Echocardiogram 05/26/2023: Normal LV systolic function with visual EF 60-65%. Left ventricle cavity is normal in size. Normal left ventricular wall thickness. Normal global wall motion. Normal diastolic filling pattern, normal LAP. Aortic valve sclerosis without stenosis. Trace aortic regurgitation. Mild (Grade I) mitral regurgitation. Mild tricuspid regurgitation. No evidence of pulmonary hypertension. No prior study for comparison.  Exercise nuclear stress test 05/16/2023: Myocardial perfusion is normal. Overall LV systolic function is normal without regional wall motion abnormalities. Stress LV EF: 61%.  Normal ECG stress. The patient exercised for 4 minutes and 40 seconds of a Bruce protocol, achieving approximately 6.62 METs & 95% MPHR. Exercise capacity reduced.  The blood pressure response was normal. No chest pain.  No previous exam available for comparison. Low risk.        Physical Exam:    Physical Exam Vitals and nursing note reviewed.  Constitutional:      General: She is not in acute distress. Neck:     Vascular: No JVD.  Cardiovascular:     Rate and Rhythm: Normal rate and regular rhythm.     Heart sounds: Normal heart sounds. No murmur heard. Pulmonary:     Effort: Pulmonary effort is normal.     Breath sounds: Normal breath sounds. No wheezing or rales.  Musculoskeletal:     Right lower leg: No edema.     Left lower leg: No edema.      VISIT DIAGNOSES:   ICD-10-CM   1. PSVT (paroxysmal supraventricular tachycardia) (HCC)  I47.10     2. Mixed hyperlipidemia  E78.2        ASSESSMENT AND PLAN: .    Lisa Costa is a 72 y.o. female with  PSVT, hyperlipidemia, prediabetes, aortic atherosclerosis   PSVT: Discussed vagal maneuvers.  Continue diltiazem 120 mg daily, and 30 mg prn.   Elevated coronary calcium score, hyperlipidemia, aortic atherosclerosis: Mildly elevated calcium score of 12.  LDL 76, HDL 54 on Crestor 10 mg daily. Continue the same.    Meds ordered this encounter  Medications   rosuvastatin (CRESTOR) 10 MG tablet    Sig: Take 1 tablet (10 mg total) by mouth daily.    Dispense:  90 tablet    Refill:  3   diltiazem (CARDIZEM CD) 120 MG 24 hr capsule    Sig: Take 1 capsule (120 mg total) by mouth daily.  Dispense:  90 capsule    Refill:  3     F/u in 1 year  Signed, Elder Negus, MD

## 2024-04-16 NOTE — Progress Notes (Signed)
 Subjective [name] is a 73 y.o. female.  Chief Complaint  Patient presents with  . Memory Loss   History of Present Illness The patient is a 73 year old female who presents for memory concerns.  She reports experiencing memory lapses, such as forgetting to close doors and cabinets, and failing to turn off the lights on two occasions within the past three months. This morning, she encountered difficulty in operating the shower faucet. She also mentions an instance where she forgot to answer a phone call. Her daughters have observed these memory issues since the death of her son two years ago, attributing them to unresolved grief. She has previously sought grief counseling for a duration of three months, followed by an additional month with another counselor, but discontinued due to persistent sadness.   She occasionally experiences fatigue and sometimes needs to remind herself of her destination when leaving the house. She tends to multitask, often starting one project before completing another. Despite maintaining a list to aid her memory, she recently forgot to pay her credit card bill. She has attempted to engage in activities such as word search puzzles and solitaire but found them uninteresting. She has been working in a socially isolated environment for the past 20 years and wonders if this could be contributing to her memory issues. She does not believe that medication for anxiety or depression would be beneficial. She is considering scheduling an appointment with a Spanish-speaking counselor.  Additionally, she has noticed a decline in her balance over the past week.  Patient's daughter works as a Child psychotherapist and is very concerned about her memory changes      Past Medical History:  Diagnosis Date  . Abnormal heart rhythm   . Arthritis   . GERD (gastroesophageal reflux disease)   . Hearing loss   . Hypercholesterolemia   . Osteoporosis    Family  History  Problem Relation Name Age of Onset  . Stroke Mother    . Arthritis Mother    . Heart disease Mother    . Cancer Father    . Diabetes Father    . Diabetes Maternal Grandmother    . Diabetes Paternal Grandmother    . Macular degeneration Neg Hx    . Retinal detachment Neg Hx    . Glaucoma Neg Hx     Social History   Socioeconomic History  . Marital status: Single    Spouse name: None  . Number of children: None  . Years of education: None  . Highest education level: None  Occupational History  . None  Tobacco Use  . Smoking status: Former    Passive exposure: Never  . Smokeless tobacco: Never  Substance and Sexual Activity  . Alcohol use: Yes  . Drug use: Never  . Sexual activity: None  Other Topics Concern  . None  Social History Narrative  . None   Social Drivers of Health   Food Insecurity: Low Risk  (02/16/2024)   Food vital sign   . Within the past 12 months, you worried that your food would run out before you got money to buy more: Never true   . Within the past 12 months, the food you bought just didn't last and you didn't have money to get more: Never true  Transportation Needs: No Transportation Needs (02/16/2024)   Transportation   . In the past 12 months, has lack of reliable transportation kept you from medical appointments, meetings, work or from  getting things needed for daily living? : No  Safety: Low Risk  (02/16/2024)   Safety   . How often does anyone, including family and friends, physically hurt you?: Never   . How often does anyone, including family and friends, insult or talk down to you?: Never   . How often does anyone, including family and friends, threaten you with harm?: Never   . How often does anyone, including family and friends, scream or curse at you?: Never  Living Situation: Low Risk  (02/16/2024)   Living Situation   . What is your living situation today?: I have a steady place to live   . Think about the place you live. Do  you have problems with any of the following? Choose all that apply:: None/None on this list   Past Surgical History:  Procedure Laterality Date  . CATARACT EXTRACTION W/  INTRAOCULAR LENS IMPLANT Right 03/17/2022   Procedure: CATARACT EXTRACTION W/  INTRAOCULAR LENS IMPLANT; CNA0T0 22.5  . CATARACT EXTRACTION W/  INTRAOCULAR LENS IMPLANT Left 03/24/2022   Procedure: CATARACT EXTRACTION W/  INTRAOCULAR LENS IMPLANT; CNA0T0 22.5  . CESAREAN SECTION, UNSPECIFIED     Procedure: CESAREAN SECTION    Current Outpatient Medications:  .  cholecalciferol 25 mcg (1,000 unit) cap, Take 1 each by mouth daily., Disp: , Rfl:  .  dilTIAZem  (CARDIZEM  CD) 120 mg 24 hr capsule, Take 120 mg by mouth Once Daily., Disp: , Rfl:  .  ibuprofen  (MOTRIN ) 800 mg tablet, Take one pill every 8 hours as needed, Disp: 180 tablet, Rfl: 1 .  meclizine  (ANTIVERT ) 25 mg chew chew tablet, Chew 1 tablet (25 mg total) 3 (three) times a day as needed (tid prn)., Disp: 30 tablet, Rfl: 1 .  metFORMIN  (GLUCOPHAGE -XR) 500 mg 24 hr tablet, Take one pill per day, Disp: 90 tablet, Rfl: 3 .  pantoprazole  (PROTONIX ) 40 mg EC tablet, Take 1 tablet (40 mg total) by mouth daily., Disp: 90 tablet, Rfl: 3 .  rosuvastatin  10 mg cpSP, , Disp: , Rfl:  Allergies  Allergen Reactions  . Tramadol Other (See Comments)   Patient Active Problem List   Diagnosis Date Noted  . PSVT (paroxysmal supraventricular tachycardia) 04/16/2019  . Recurrent UTI 05/03/2017  . Hx of adenomatous polyp of colon 01/10/2012  . Absence of bladder continence 12/22/2009  . Hypercholesterolemia 03/24/2009  . Other diseases of lung, not elsewhere classified 01/03/2009  . Microscopic hematuria 12/31/2008  . Other specified symptom associated with female genital organs 12/05/2008  . Disorder of bone and cartilage 05/07/2008  . Decreased hearing 05/06/2008  . Esophageal reflux 10/03/2007     The following information was reviewed by members of the visit team:   Tobacco  Allergies  Meds  Problems  Med Hx  Surg Hx  OB Status   Fam Hx      HPI  Review of Systems  Objective Vitals:   04/16/24 1704  BP: 125/78  Pulse: 86  Resp: 18  Temp: 98.1 F (36.7 C)  SpO2: 98%  Weight: 73.5 kg (162 lb)  Height: 1.651 m (5' 5)    Physical Exam Vitals and nursing note reviewed.  Constitutional:      General: She is not in acute distress.    Appearance: Normal appearance.  Neurological:     General: No focal deficit present.     Mental Status: She is alert. Mental status is at baseline.  Psychiatric:        Attention and Perception: Attention  and perception normal. She is attentive.        Mood and Affect: Mood and affect normal. Mood is not anxious.        Speech: Speech normal. She is communicative.        Behavior: Behavior normal. Behavior is not agitated. Behavior is cooperative.        Thought Content: Thought content normal.        Cognition and Memory: Cognition normal.        Judgment: Judgment normal.   MMSE 29/30 I have personally spent 24 minutes involved in face-to-face and non-face-to-face activities for this patient on the day of the visit.  Professional time spent includes the following activities, in addition to those noted in the documentation:  - preparing to see the patient (e.g., review of recent and/or remote lab/imaging/study results, provider notes, and patient messages/phone calls available in current EMR, CareEverywhere, and scanned records) -obtaining and/or reviewing separately obtained history either through past provider notes, patient phone calls, and/or patient's family member(s)/caregiver(s) -performing a medically appropriate examination and/or evaluation -counseling and educating the patient/family/caregiver -ordering medications, tests, or procedures -documenting clinical information in the electronic or other health record -reviewing most up to date studies or expert consensus guidelines for  screening/diagnosing/treating pertinent conditions/symptoms -independently interpreting results (not separately reported) and communicating results to the patient/family/caregiver -care coordination (not separately reported) -referring and communicating with other health care professionals (when not separately reported)     Assessment/Plan Diagnoses and all orders for this visit:  Memory changes -     TSH -     Vitamin B12 -     Vitamin D , 25-Hydroxy   Assessment & Plan 1. Memory concerns. - Mini-Mental status exam score of 29 out of 30, indicating no significant cognitive impairment. - Discussed the potential impact of grief on memory and the importance of counseling or therapy in her native language. - Advised to maintain a healthy diet, ensure adequate sleep, and engage in regular physical activity such as walking. - Referral to a neurologist will be initiated for further evaluation.( Patient and family request )  Blood tests for thyroid  function, B12 levels, and vitamin D  levels will be conducted today, with results communicated via the portal.     Electronically signed: Kristen Diane Kaplan, PA-C 04/16/2024  5:30 PM

## 2024-04-17 ENCOUNTER — Encounter: Payer: Self-pay | Admitting: Physician Assistant

## 2024-06-03 NOTE — Progress Notes (Addendum)
 Assessment/Plan:   Lisa Costa is a very pleasant 73 y.o. year old RH female with a history of hypertension, hyperlipidemia, anxiety, depression, osteoporosis prediabetes seen today for evaluation of memory loss. MoCA today is29 /30. Etiology unclear, suspect a component of anxiety and depression. Patient is able to participate on ADLs and continues to drive without significant difficulties.     Memory Impairment of unclear etiology  MRI brain without contrast to assess for underlying structural abnormality and assess vascular load  Neurocognitive testing to further evaluate cognitive concerns and determine other underlying cause of memory changes, including potential contribution from sleep, anxiety, attention, or depression  Continue to control mood as per PCP, continue counseling for anxiety and depression Recommend good control of cardiovascular risk factors Folllow up after neuropsych   Increase socialization   Subjective:   The patient is here alone.   How long did patient have memory difficulties?  For the last 2 years, after the death of her son, initially attributed to grief.   For the last 3 months, at times she is forgetting to close the cabinets and forgetting to turn the lights off.She admits being a perfectionist, so this things affect me.  Reports some difficulty remembering new information, conversations and names.  Her long-term memory is good.  Not frequently, she has more difficulty with  multitasking, unable to finish one project before starting another.  She does not like brain exercise activities such as puzzles and solitaire.  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 I am not very sociable. My daughter told me this is not healthy, I am planning to reduce my hours-she says.    repeats oneself?  Endorsed by her  daughters.  Disoriented when walking into a room?  Patient denies except occasionally not remembering what patient came to the room for    Leaving objects in unusual places? Denies.   Wandering behavior?  Denies, but when she goes out she needs to remind herself of where she is going. Any personality changes?  Denies.   Any history of depression?:  Endorsed, she lost her son 2 years ago, has sought grief counseling, now she is interested in seeking Spanish-speaking only counseling. Hallucinations or paranoia?  Denies   Seizures?  Denies    Any sleep changes?   Sleeps well. Denies vivid dreams, REM behavior or sleepwalking   Sleep apnea?  Denies   Any hygiene concerns?  Denies   Independent of bathing and dressing?  Endorsed  Does the patient needs help with medications? Patient is in charge   Who is in charge of the finances? Patient is in charge     Any changes in appetite?  Denies     Patient have trouble swallowing? Denies.   Does the patient cook? No    Any kitchen accidents such as leaving the stove on? Denies.   Any history of headaches?   Denies.   Chronic pain ? Denies.   Ambulates with difficulty?  Denies Recent falls or head injuries? 6 years ago she had a fall, no LOC or head injury, and she continues to do PT on her own.  Vision changes? Denies.   Any stroke like symptoms? Denies.   Any tremors?   Denies.   Any anosmia?  Denies.   Any incontinence of urine?  Endorsed, stress incontinence, no  recent UTI Any bowel dysfunction? Denies.      Patient lives with my son's family.    History of heavy alcohol intake? Denies.   History of heavy tobacco use? Denies.   Family history of dementia? Denies.  Does patient drive? Yes, sometimes I am not sure where I am supposed to go    Recent labs May 2025 TSH 2.705, B12 1408, vitamin D  45, A1c 5.8  Past Medical History:  Diagnosis Date   Allergy    Anxiety    Arthritis    Cataract    both eyes   Depression    Depression     [employer] 02/06/2021   GERD (gastroesophageal reflux disease)    Heart murmur    Hx of adenomatous polyp of colon 03/16/2018   Hypercholesteremia    Hyperlipidemia    [employer] 02/06/2021   Left radial head fracture    Osteoporosis    Osteoporosis    [employer] 02/06/2021   Tuberculosis    vaccine in [place] PPD tests positive each time/had treatment for 4 months     Past Surgical History:  Procedure Laterality Date   CATARACT EXTRACTION     CESAREAN SECTION     3 births   COLONOSCOPY     POLYPECTOMY       Allergies  Allergen Reactions   Tramadol Nausea And Vomiting   Other     Current Outpatient Medications  Medication Instructions   cetirizine  (ZYRTEC ) 10 MG tablet Take by mouth.   chlorhexidine (PERIDEX) 0.12 % solution No dose, route, or frequency recorded.   Cholecalciferol (VITAMIN D3) 1000 units CAPS Take by mouth.   diltiazem  (CARDIZEM  CD) 120 mg, Oral, Daily   diltiazem  (CARDIZEM ) 30 MG tablet TAKE 1 TABLET BY MOUTH AS NEEDED FOR  PALPITATIONS.  MAX  DOSE  OF  4  TABLETS  PER  24  HOUR  PERIOD   ibuprofen  (ADVIL ) 800 mg, Oral, Every 8 hours PRN   meclizine  (ANTIVERT ) 12.5 mg, Oral, 3 times daily PRN   metFORMIN  (GLUCOPHAGE -XR) 500 mg, Daily with breakfast   pantoprazole  (PROTONIX ) 40 MG tablet 1 tablet, Daily   rosuvastatin  (CRESTOR ) 10 mg, Oral, Daily   triamcinolone  (KENALOG ) 0.1 % 2 times daily     VITALS:   Vitals:   06/04/24 0948  BP: 131/73  Pulse: 68  SpO2: 97%  Weight: 163 lb 12.8 oz (74.3 kg)  Height: 5' 4 (1.626 m)         06/04/2024   12:00 PM  Montreal Cognitive Assessment   Visuospatial/ Executive (0/5) 5  Naming (0/3) 3  Attention: Read list of digits (0/2) 2  Attention: Read list of letters (0/1) 1  Attention: Serial 7 subtraction starting at 100 (0/3) 3  Language: Repeat phrase (0/2) 2  Language : Fluency (0/1) 1  Abstraction (0/2) 2  Delayed Recall (0/5) 4  Orientation (0/6) 6  Total 29        No data to display            PHYSICAL EXAM   HEENT:  Normocephalic, atraumatic. The superficial temporal arteries are without ropiness or tenderness. Cardiovascular: Regular rate and rhythm. Lungs: Clear to auscultation bilaterally. Neck: There are no carotid bruits noted bilaterally.  Orientation:  Alert and oriented to person, place and time. No aphasia or dysarthria. Fund of knowledge is appropriate. Recent memory impaired and remote memory intact.  Attention and concentration are normal.  Able to name objects and repeat phrases. Delayed recall  /  5 Cranial nerves: There is good facial symmetry. Extraocular muscles are intact and visual fields are full to confrontational testing. Speech is fluent and clear. No tongue deviation. Hearing is decreased to conversational tone. Tone: Tone is good throughout. Abnormal movements: No tremors. No Asterixis. No Fasciculations Sensation: Sensation is intact to light touch. Vibration is intact at the bilateral big toe.  Coordination: The patient has no difficulty with RAM's or FNF bilaterally. Normal finger to nose  Motor: Strength is 5/5 in the bilateral upper and lower extremities. There is no pronator drift. There are no fasciculations noted. DTR's: Deep tendon reflexes are 2/4 bilaterally. Gait and Station: The patient is able to ambulate without difficulty The patient is able to heel toe walk. Gait is cautious and narrow. The patient is able to ambulate in a tandem fashion.       Thank you for allowing us  the opportunity to participate in the care of this nice patient. Please do not hesitate to contact us  for any questions or concerns.   Total time spent on today's visit was 42 minutes dedicated to this patient today, preparing to see patient, examining the patient, ordering tests and/or medications and counseling the patient, documenting clinical information in the EHR or other health record, independently interpreting results and communicating results to the patient/family,  discussing treatment and goals, answering patient's questions and coordinating care.  Cc:  Debrah Josette ORN., PA-C  Camie Sevin 06/04/2024 12:50 PM

## 2024-06-04 ENCOUNTER — Ambulatory Visit

## 2024-06-04 ENCOUNTER — Ambulatory Visit: Admitting: Physician Assistant

## 2024-06-04 VITALS — BP 131/73 | HR 68 | Ht 64.0 in | Wt 163.8 lb

## 2024-06-04 DIAGNOSIS — R413 Other amnesia: Secondary | ICD-10-CM | POA: Insufficient documentation

## 2024-06-04 NOTE — Patient Instructions (Signed)
 It was a pleasure to see you today at our office.   Recommendations:  Neurocognitive evaluation at our office   MRI of the brain, the radiology office will call you to arrange you appointment   Follow up after the neuropsych evaluation    [email or web address]   RECOMMENDATIONS FOR ALL PATIENTS WITH MEMORY PROBLEMS: 1. Continue to exercise (Recommend 30 minutes of walking everyday, or 3 hours every week) 2. Increase social interactions - continue going to [place] and enjoy social gatherings with friends and family 3. Eat healthy, avoid fried foods and eat more fruits and vegetables 4. Maintain adequate blood pressure, blood sugar, and blood cholesterol level. Reducing the risk of stroke and cardiovascular disease also helps promoting better memory. 5. Avoid stressful situations. Live a simple life and avoid aggravations. Organize your time and prepare for the next day in anticipation. 6. Sleep well, avoid any interruptions of sleep and avoid any distractions in the bedroom that may interfere with adequate sleep quality 7. Avoid sugar, avoid sweets as there is a strong link between excessive sugar intake, diabetes, and cognitive impairment We discussed the Mediterranean diet, which has been shown to help patients reduce the risk of progressive memory disorders and reduces cardiovascular risk. This includes eating fish, eat fruits and green leafy vegetables, nuts like almonds and hazelnuts, walnuts, and also use olive oil. Avoid fast foods and fried foods as much as possible. Avoid sweets and sugar as sugar use has been linked to worsening of memory function.  There is always a concern of gradual progression of memory problems. If this is the case, then we may need to adjust level of care according to patient needs. Support, both to the patient and caregiver, should then be put into place.      You have been referred for a neuropsychological  evaluation (i.e., evaluation of memory and thinking abilities). Please bring someone with you to this appointment if possible, as it is helpful for the doctor to hear from both you and another adult who knows you well. Please bring eyeglasses and hearing aids if you wear them.    The evaluation will take approximately 3 hours and has two parts:   The first part is a clinical interview with the neuropsychologist (Dr. Richie or Dr. Gayland). During the interview, the neuropsychologist will speak with you and the individual you brought to the appointment.    The second part of the evaluation is testing with the doctor's technician Neal or Luke). During the testing, the technician will ask you to remember different types of material, solve problems, and answer some questionnaires. Your family member will not be present for this portion of the evaluation.   Please note: We must reserve several hours of the neuropsychologist's time and the psychometrician's time for your evaluation appointment. As such, there is a No-Show fee of $100. If you are unable to attend any of your appointments, please contact our office as soon as possible to reschedule.      DRIVING: Regarding driving, in patients with progressive memory problems, driving will be impaired. We advise to have someone else do the driving if trouble finding directions or if minor accidents are reported. Independent driving assessment is available to determine safety of driving.   If you are interested in the driving assessment, you can contact the following:  [facility] [employer] [facility] in [place] [phone]  [facility] [phone]  [facility] [phone]  [facility] [phone] or [phone]   FALL PRECAUTIONS: Be  cautious when walking. Scan the area for obstacles that may increase the risk of trips and falls. When getting up in the mornings, sit up at the edge of the bed for a few minutes before  getting out of bed. Consider elevating the bed at the head end to avoid drop of blood pressure when getting up. Walk always in a well-lit room (use night lights in the walls). Avoid area rugs or power cords from appliances in the middle of the walkways. Use a walker or a cane if necessary and consider physical therapy for balance exercise. Get your eyesight checked regularly.  FINANCIAL OVERSIGHT: Supervision, especially oversight when making financial decisions or transactions is also recommended.  HOME SAFETY: Consider the safety of the kitchen when operating appliances like stoves, microwave oven, and blender. Consider having supervision and share cooking responsibilities until no longer able to participate in those. Accidents with firearms and other hazards in the house should be identified and addressed as well.   ABILITY TO BE LEFT ALONE: If patient is unable to contact 911 operator, consider using LifeLine, or when the need is there, arrange for someone to stay with patients. Smoking is a fire hazard, consider supervision or cessation. Risk of wandering should be assessed by caregiver and if detected at any point, supervision and safe proof recommendations should be instituted.  MEDICATION SUPERVISION: Inability to self-administer medication needs to be constantly addressed. Implement a mechanism to ensure safe administration of the medications.      Mediterranean Diet A Mediterranean diet refers to food and lifestyle choices that are based on the traditions of countries located on the [employer] [place]. This way of eating has been shown to help prevent certain conditions and improve outcomes for people who have chronic diseases, like kidney disease and heart disease. What are tips for following this plan? Lifestyle  Cook and eat meals together with your family, when possible. Drink enough fluid to keep your urine clear or pale yellow. Be physically active every day. This  includes: Aerobic exercise like running or swimming. Leisure activities like gardening, walking, or housework. Get 7-8 hours of sleep each night. If recommended by your health care provider, drink red wine in moderation. This means 1 glass a day for nonpregnant women and 2 glasses a day for men. A glass of wine equals 5 oz (150 mL). Reading food labels  Check the serving size of packaged foods. For foods such as rice and pasta, the serving size refers to the amount of cooked product, not dry. Check the total fat in packaged foods. Avoid foods that have saturated fat or trans fats. Check the ingredients list for added sugars, such as corn syrup. Shopping  At the grocery store, buy most of your food from the areas near the walls of the store. This includes: Fresh fruits and vegetables (produce). Grains, beans, nuts, and seeds. Some of these may be available in unpackaged forms or large amounts (in bulk). Fresh seafood. Poultry and eggs. Low-fat dairy products. Buy whole ingredients instead of prepackaged foods. Buy fresh fruits and vegetables in-season from local farmers markets. Buy frozen fruits and vegetables in resealable bags. If you do not have access to quality fresh seafood, buy precooked frozen shrimp or canned fish, such as tuna, salmon, or sardines. Buy small amounts of raw or cooked vegetables, salads, or olives from the deli or salad bar at your store. Stock your pantry so you always have certain foods on hand, such as olive oil, canned tuna, canned  tomatoes, rice, pasta, and beans. Cooking  Cook foods with extra-virgin olive oil instead of using butter or other vegetable oils. Have meat as a side dish, and have vegetables or grains as your main dish. This means having meat in small portions or adding small amounts of meat to foods like pasta or stew. Use beans or vegetables instead of meat in common dishes like chili or lasagna. Experiment with different cooking methods. Try  roasting or broiling vegetables instead of steaming or sauteing them. Add frozen vegetables to soups, stews, pasta, or rice. Add nuts or seeds for added healthy fat at each meal. You can add these to yogurt, salads, or vegetable dishes. Marinate fish or vegetables using olive oil, lemon juice, garlic, and fresh herbs. Meal planning  Plan to eat 1 vegetarian meal one day each week. Try to work up to 2 vegetarian meals, if possible. Eat seafood 2 or more times a week. Have healthy snacks readily available, such as: Vegetable sticks with hummus. Greek yogurt. Fruit and nut trail mix. Eat balanced meals throughout the week. This includes: Fruit: 2-3 servings a day Vegetables: 4-5 servings a day Low-fat dairy: 2 servings a day Fish, poultry, or lean meat: 1 serving a day Beans and legumes: 2 or more servings a week Nuts and seeds: 1-2 servings a day Whole grains: 6-8 servings a day Extra-virgin olive oil: 3-4 servings a day Limit red meat and sweets to only a few servings a month What are my food choices? Mediterranean diet Recommended Grains: Whole-grain pasta. Brown rice. Bulgar wheat. Polenta. Couscous. Whole-wheat bread. [place] Made[place]. Vegetables: Artichokes. Beets. Broccoli. Cabbage. Carrots. Eggplant. Green beans. Chard. Kale. Spinach. Onions. Leeks. Peas. Squash. Tomatoes. Peppers. Radishes. Fruits: Apples. Apricots. Avocado. Berries. Bananas. Cherries. Dates. Figs. Grapes. Lemons. Melon. Oranges. Peaches. Plums. Pomegranate. Meats and other protein foods: Beans. Almonds. Sunflower seeds. Pine nuts. Peanuts. Cod. Salmon. Scallops. Shrimp. Tuna. Tilapia. Clams. Oysters. Eggs. Dairy: Low-fat milk. Cheese. Greek yogurt. Beverages: Water. Red wine. Herbal tea. Fats and oils: Extra virgin olive oil. Avocado oil. Grape seed oil. Sweets and desserts: [place] yogurt with honey. Baked apples. Poached pears. Trail mix. Seasoning and other foods: Basil. Cilantro. Coriander. Cumin. Mint.  Parsley. Sage. Rosemary. Tarragon. Garlic. Oregano. Thyme. Pepper. Balsalmic vinegar. Tahini. Hummus. Tomato sauce. Olives. Mushrooms. Limit these Grains: Prepackaged pasta or rice dishes. Prepackaged cereal with added sugar. Vegetables: Deep fried potatoes (french fries). Fruits: Fruit canned in syrup. Meats and other protein foods: Beef. Pork. Lamb. Poultry with skin. Hot dogs. [place]. Dairy: Ice cream. Sour cream. Whole milk. Beverages: Juice. Sugar-sweetened soft drinks. Beer. Liquor and spirits. Fats and oils: Butter. Canola oil. Vegetable oil. Beef fat (tallow). Lard. Sweets and desserts: Cookies. Cakes. Pies. Candy. Seasoning and other foods: Mayonnaise. Premade sauces and marinades. The items listed may not be a complete list. Talk with your dietitian about what dietary choices are right for you. Summary The Mediterranean diet includes both food and lifestyle choices. Eat a variety of fresh fruits and vegetables, beans, nuts, seeds, and whole grains. Limit the amount of red meat and sweets that you eat. Talk with your health care provider about whether it is safe for you to drink red wine in moderation. This means 1 glass a day for nonpregnant women and 2 glasses a day for men. A glass of wine equals 5 oz (150 mL). This information is not intended to replace advice given to you by your health care provider. Make sure you discuss any questions you have with your  health care provider. Document Released: 07/08/2016 Document Revised: 08/10/2016 Document Reviewed: 07/08/2016 Elsevier Interactive Patient Education  2017 [employer].

## 2024-06-11 ENCOUNTER — Encounter: Payer: Self-pay | Admitting: Physician Assistant

## 2024-06-18 ENCOUNTER — Ambulatory Visit
Admission: RE | Admit: 2024-06-18 | Discharge: 2024-06-18 | Disposition: A | Discharge status: Home / Self Care | Source: Ambulatory Visit | Attending: Physician Assistant | Admitting: Physician Assistant

## 2024-06-18 DIAGNOSIS — R413 Other amnesia: Secondary | ICD-10-CM

## 2024-06-21 ENCOUNTER — Ambulatory Visit: Payer: Self-pay | Admitting: Physician Assistant

## 2024-06-25 ENCOUNTER — Ambulatory Visit: Admitting: Psychology

## 2024-06-25 ENCOUNTER — Ambulatory Visit: Payer: Self-pay | Admitting: Psychology

## 2024-06-25 DIAGNOSIS — R4189 Other symptoms and signs involving cognitive functions and awareness: Secondary | ICD-10-CM

## 2024-06-25 DIAGNOSIS — R419 Unspecified symptoms and signs involving cognitive functions and awareness: Secondary | ICD-10-CM

## 2024-06-25 NOTE — Progress Notes (Signed)
   Psychometrician Note   Cognitive testing was administered to [facility] [place] by Lonell Jude, B.S. (psychometrist) under the supervision of Dr. Renda Beckwith, Psy.D., licensed psychologist on 06/25/2024. Ms. Lisa Costa did not appear overtly distressed by the testing session per behavioral observation or responses across self-report questionnaires. Rest breaks were offered.    The battery of tests administered was selected by Dr. Renda Beckwith, Psy.D. with consideration to Ms. Lisa Costa's current level of functioning, the nature of her symptoms, emotional and behavioral responses during interview, level of literacy, observed level of motivation/effort, and the nature of the referral question. This battery was communicated to the psychometrist. Communication between Dr. Renda Beckwith, Psy.D. and the psychometrist was ongoing throughout the evaluation and Dr. Renda Beckwith, Psy.D. was immediately accessible at all times. Dr. Renda Beckwith, Psy.D. provided supervision to the psychometrist on the date of this service to the extent necessary to assure the quality of all services provided.    [facility] Lisa Costa will return within approximately 1-2 weeks for an interactive feedback session with Dr. Beckwith at which time her test performances, clinical impressions, and treatment recommendations will be reviewed in detail. Ms. Lisa Costa understands she can contact our office should she require our assistance before this time.  A total of 100 minutes of billable time were spent face-to-face with Lisa Costa by the psychometrist. This includes both test administration and scoring time. Billing for these services is reflected in the clinical report generated by Dr. Renda Beckwith, Psy.D.  This note reflects time spent with the psychometrician and does not include test scores or any clinical interpretations made by Dr. Beckwith. The full report will follow in a  separate note.

## 2024-06-25 NOTE — Progress Notes (Unsigned)
 NEUROPSYCHOLOGICAL EVALUATION [place]. [facility]  [place] Department of Neurology  Date of Evaluation: 06/25/2024  REASON FOR REFERRAL   Lisa Costa is a 73 year old, right-handed, Hispanic female with eight years of formal education. She was referred for neuropsychological evaluation by Camie Sevin, PA-C, to assess current neurocognitive functioning, document potential cognitive deficits, and assist with treatment planning. This is her first neuropsychological evaluation.  SUMMARY OF RESULTS   Patient is a nonnative [place] speaker and would have been better served by a bilingual examiner able to administer the assessment battery in both [place] and [place]. However, due to limitations in available resources, brief testing was conducted in [place]. Performance on culturally-mediated tasks or those with high verbal demands may underestimate her true abilities. Prior to testing, these limitations and their potential effects on results and interpretation were discussed with the patient. She expressed understanding and agreed to proceed with the evaluation. Cautious interpretation is warranted due to the limited availability of culturally-appropriate tests and normative data.  Premorbid cognitive abilities are estimated to be in the average range based on word reading and sociodemographic factors. Relative to this baseline estimate, performance today was broadly within age-related expectations, apart from variable confrontation naming. However, when permitted to respond in both [place] and Spanish, her confrontation naming score improved to the normal range. All remaining measures were within normal limits, including attention/working memory, processing speed, executive functioning, visuospatial abilities, and learning/memory.   On self-report questionnaires, she did not endorse clinically-elevated symptoms of depression or anxiety.  DIAGNOSTIC IMPRESSION   Results of  today's brief evaluation indicated generally normal cognitive performance, despite assessment limitations related to language and cultural factors. There is not strong evidence of a neurocognitive disorder at this time. It is encouraging that most aspects of language function appeared intact. It is also reassuring that the level of cognitive concerns reported during the clinical interview was minimal. Subjective cognitive concerns are most likely attributable to normal aging, prolonged grief, and a demanding work schedule. Notably, she has recently begun taking positive steps toward processing her grief by starting counseling and has already reported feeling better as a result.  Results provide a helpful baseline for future comparison, should reevaluation become necessary. If feasible, priority should be given to scheduling any future evaluations with a bilingual provider, as this would likely offer a more accurate and supportive assessment environment.  ICD-10 Codes: R41.9 Cognitive concerns  RECOMMENDATIONS   In consultation with your doctor, schedule cognitive reevaluation on an as-needed basis to assess for cognitive decline and update treatment recommendations. Reevaluation should occur during a period of medical and affective stability.  Continue to monitor your mood regularly and stay engaged with weekly counseling sessions to support your mental health, especially given that emotional distress can exacerbate cognitive difficulties.  Aim to participate in activities that you find enjoyable and fulfilling, whether that be hobbies, socializing with loved ones, or being outdoors. This can improve mood, increase motivation, and offer cognitive stimulation.   Continue managing vascular risk factors through a heart healthy diet (e.g., MIND, Mediterranean), physician-approved physical activity, and medication adherence.   Consider implementing compensatory strategies to maximize independence and  maintain daily functioning. Examples include:   -Adhere to routine. Compensatory strategies work best when they are used consistently. Use a planner, calendar, or white board that has the schedule and important events for the day clearly listed to reference and cross off when tasks are complete.   -Ask for written information, especially if it is new  or unfamiliar (e.g., information provided at a doctor's appointment).   -Create an organized environment. Keep items that can be easily misplaced in a sensible location and get into the habit of always returning the items to those places.   -Pay attention and reduce distractions. Make a point of focusing attention on information you want to remember. One-on-one interaction is more likely to facilitate attention and minimize distraction. Make eye contact and repeat the information out loud after you hear it. Reduce interruptions or distractions especially when attempting to learn new information.   -Create associations. When learning something new, think about and understand the information. Explain it in your own words or try to associate it with something you already know. Take notes to help remember important details.  -Evaluate goals and plan accordingly. When confronted by many different tasks, begin by making a list that prioritizes each task and estimates the time it will take to complete. Break down complicated tasks into smaller, more manageable steps.   -Focus on one task at a time and complete each task before starting another. Avoid multitasking.  DISPOSITION   Patient will follow up with the referring provider, Ms. Wertman. No follow-up neuropsychological testing was scheduled at this time. Please feel free to refer the patient for repeated evaluation if she shows a significant change in neurocognitive status. She will be provided verbal feedback in approximately one week regarding the findings and impression during this visit.  The  remainder of the report includes the details of the patient's background and a table of results from the current evaluation, which support the summary and recommendations described above.  BACKGROUND   History of Presenting Illness: The following information was obtained from a review of medical records and an interview with the patient. Patient established care with Camie Sevin, PA-C, on 06/04/2024 due to memory difficulties over the past two years, coinciding with the death of her son. Cognitive symptoms were initially attributed to grief, but over the last three months, she has noticed increased forgetfulness. MoCA = 29/30. She was referred for neuropsychological evaluation accordingly.  Cognitive Functioning: During today's appointment, the patient shared that she did not notice any cognitive concerns until this year, although her daughter had pointed out changes about two years ago. The daughter, who works in Print production planner, believed that these changes might be related to the grief the patient experienced following the death of her son in Jul 29, 2022. Patient described primarily noticing short-term memory issues, such as forgetting to close a cabinet. She denied major problems with recalling conversations or misplacing items. She also endorsed word-finding difficulties in both [place] and [place], with English being more affected. When asked about her attention, she explained that she sometimes begins one task then remembers another, but this does not distract her, as she ultimately completes both tasks without issue. She denied difficulties with processing speed, navigation, and executive functioning (e.g., planning and organizing).  Physical Functioning: Patient denied difficulties with sleep initiation or maintenance but acknowledged that she wakes up every three hours to use the restroom. She described her energy level as high for her age. Appetite is stable. No changes in sense of taste or smell were  reported. She endorsed hearing loss, which is managed with bilateral hearing aids. Vision is stable. She denied balance problems, recent falls, and tremors.  Emotional Functioning: Patient explained that before starting therapy a month ago, she cried frequently. She also noted experiencing symptoms of anxiety. Since beginning weekly counseling, however, she reportedly feels much better.  Imaging: MRI of the brain (06/18/2024) was unremarkable.  Other Relevant Medical History: Remarkable for hyperlipidemia, and gastroesophageal reflux disease. Please refer to the medical record for a more comprehensive problem list. No history of stroke, CNS infection, head injury, or seizure was reported.  Current Medications: Per record, cetirizine , chlorhexidine solution, diltiazem , ibuprofen , meclizine , metformin , pantoprazole , rosuvastatin , triamcinolone  cream, and vitamin D3.  Functional Status: Patient independently performs all basic and instrumental (e.g., driving, finances, medications, appointments) activities of daily living without difficulty.  Family Neurological History: Patient suspects her father likely had dementia.  Psychiatric History: Patient began meeting with a counselor one month ago due to persistent grief following the death of her son. History of depression, anxiety, prior mental health treatment, suicidal ideation, hallucinations, and psychiatric hospitalizations was not reported.  Substance Use History: Patient reported infrequent alcohol consumption. Current use of nicotine, marijuana, and illicit substances was denied. She quit smoking cigarettes in 07/27/2008.  Social/Developmental, Educational, and Occupational History: Patient was born in [place]. History of perinatal complications and developmental delays was not reported. She completed her early education in [place], although she began learning some English during elementary school. 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 No history of childhood learning disability, special education services, or grade retention was reported. She specified that her technical school was a bilingual program, where her courses were in both [place] and [place]. She moved to the [place]  21 years ago. She is currently employed as a Oncologist, working seven days a week, and the clients she cares for are English-speaking. She is widowed. She has three children, one of whom passed away in 07-27-22. She technically lives with her daughter-in-law, three grandchildren, and one great-grandchild; however, she explained that she is rarely home because of her work, so she does not see them much.  BEHAVIORAL OBSERVATIONS   Patient arrived on time and was unaccompanied. She ambulated independently and without gait disturbance. She was alert and fully oriented. She was appropriately groomed and dressed for the setting. No significant motor abnormalities were observed. Vision (with glasses) and hearing (with bilateral hearing aids) were adequate for testing purposes. Speech was accented but of normal rate, prosody, and volume. No conversational word-finding difficulties, paraphasic errors, or dysarthria were observed. Comprehension was conversationally intact. Thought processes were linear, logical, and coherent. Thought content was organized and devoid of delusions. Insight appeared appropriate. Affect was even and congruent with euthymic mood. She was cooperative and gave adequate effort during testing, including on standalone and embedded measures of performance validity. Results are thought to accurately reflect her cognitive functioning at this time.  NEUROPSYCHOLOGICAL TESTING RESULTS   Tests Administered: Animal Naming Test; Color Trails Test; Controlled Oral Word Association Test (COWAT): FAS; Geriatric Anxiety Scale-10 Item (GAS-10); Geriatric Depression Scale Short  Form (GDS-SF); Neuropsychological Assessment Battery (NAB) Form 1 - Subtest(s): Naming, Chief Executive Officer; Repeatable Battery for the Assessment of Neuropsychological Status Update (RBANS Update) - Form A; Standalone performance validity test (PVT); Wechsler Adult Intelligence Scale Fifth Edition (WAIS-5) - Subtest(s): Matrix Reasoning, and Wide Range Achievement Test Fourth Edition (WRAT4) - Subtest(s): Word Reading.  Test results are provided in the table below. Whenever possible, the patient's scores were compared against age-, sex-, and education-corrected normative samples. Interpretive descriptions are based on the AACN consensus conference statement on uniform labeling (Guilmette et al., July 28, 2019).  COGNITIVE SCREENING RAW  RANGE  RBANS Total Score -- StdS=96 Average  RBANS Immediate Memory --  StdS=87 Low Average  RBANS Visuospatial/Constructional -- StdS=100 Average  RBANS Language -- StdS=92 Average  RBANS Attention -- StdS=109 Average  RBANS Delayed Memory -- StdS=101 Average  PREMORBID FUNCTIONING RAW  RANGE  WRAT Word Reading  StdS=101 Average  ATTENTION & PROCESSING SPEED RAW  RANGE  RBANS Attention -- StdS=109 Average  Digit Span -- ss=10 Average  Coding -- ss=13 High Average  Color Trails 1 30''0e T=65 Above Average  EXECUTIVE FUNCTION RAW  RANGE  Color Trails 2 76''0e T=64 Above Average  WAIS-5 Matrix Reasoning -- ss=9 Average  COWAT Letter Fluency 14+9+18 T=54 Average  LANGUAGE RAW  RANGE  RBANS Language -- StdS=92 Average  Picture Naming 8/10 3-9%ile Below Average  Semantic Fluency 21 ss=11 Average  Animal Naming Test 14 T=45 Average  NAB Naming Test (Any language) 28/31 T=43 WNL  NAB Naming Test (English only) 20/31 T=22 Exceptionally Low  VISUOSPATIAL RAW  RANGE  RBANS Visuospatial/Constructional -- StdS=100 Average  Figure Copy 19/20 ss=12 High Average  Line Orientation -- 26-50%ile Average  NAB Design Construction  -- T=65 Above Average  VERBAL LEARNING & MEMORY  RAW  RANGE  RBANS Word List -- -- --  List Learning (4+4+8+6)/40 ss=7 Low Average  List Recall 6/10 51-75%ile Average  List Recognition 20/20 51-75%ile Average  RBANS Story -- -- --  Story Memory (7+9)/24 ss=9 Average  Story Recall 9/12 ss=10 Average  Story Recognition 12/12 69+%ile WNL  VISUAL LEARNING & MEMORY RAW  RANGE  RBANS Figure -- -- --  Figure Copy 19/20 ss=12 High Average  Figure Recall 9/20 ss=8 Average  Figure Recognition 7/8 53-69%ile WNL  QUESTIONNAIRES RAW  RANGE  GDS-SF 3 -- Minimal  GAS-10 4 -- Minimal  *Note: ss = scaled score; StdS = standard score; T = t-score; C/S = corrected raw score; WNL = within normal limits; BNL= below normal limits; D/C = discontinued. Scores from skewed distributions are typically interpreted as WNL (>=16th %ile) or BNL (<16th %ile).   INFORMED CONSENT   Patient was provided with a verbal description of the nature and purpose of the neuropsychological evaluation. Also reviewed were the foreseeable risks and/or discomforts and benefits of the procedure, limits of confidentiality, and mandatory reporting requirements of this provider. Patient was given the opportunity to have their questions answered. Oral consent to participate was provided by the patient.   This report was prepared as part of a clinical evaluation and is not intended for forensic use.  SERVICE   This evaluation was conducted by Renda Beckwith, Psy.D. In addition to time spent directly with the patient, total professional time (120 minutes) includes record review, integration of relevant medical history, test selection, interpretation of findings, and report preparation. A technician, Lonell Jude, B.S., provided testing and scoring assistance for 100 minutes.  Psychiatric Diagnostic Evaluation Services (Professional): 09208 x 1 Neuropsychological Testing Evaluation Services (Professional): 03867 x 1 Neuropsychological Testing Evaluation Services (Professional): 03866 x  1 Neuropsychological Test Administration and Scoring Radiographer, therapeutic): [phone] x 1 Neuropsychological Test Administration and Scoring (Technician): [phone] x 2  This report was generated using voice recognition software. While this document has been carefully reviewed, transcription errors may be present. I apologize in advance for any inconvenience. Please contact me if further clarification is needed.            Renda Beckwith, Psy.D.             Neuropsychologist

## 2024-07-02 ENCOUNTER — Ambulatory Visit: Admitting: Psychology

## 2024-07-02 DIAGNOSIS — R419 Unspecified symptoms and signs involving cognitive functions and awareness: Secondary | ICD-10-CM | POA: Diagnosis not present

## 2024-07-02 NOTE — Progress Notes (Signed)
   NEUROPSYCHOLOGY FEEDBACK SESSION [place]. [facility]  [place] Department of Neurology  Date of Feedback Session: 07/02/2024  REASON FOR REFERRAL   [facility] Lisa Costa is a 73 year old, right-handed, Hispanic female with eight years of formal education. She was referred for neuropsychological evaluation by Camie Sevin, PA-C, to assess current neurocognitive functioning, document potential cognitive deficits, and assist with treatment planning. This is her first neuropsychological evaluation.  FEEDBACK   Patient completed a comprehensive neuropsychological evaluation on 06/25/2024. Please refer to that encounter for the full report and recommendations. Briefly, results indicated generally normal cognitive performance, despite assessment limitations related to language and cultural factors. There is not strong evidence of a neurocognitive disorder at this time. It is encouraging that most aspects of language function appeared intact. It is also reassuring that the level of cognitive concerns reported during the clinical interview was minimal. Subjective cognitive concerns are most likely attributable to normal aging, prolonged grief, and a demanding work schedule.  Today, the patient was unaccompanied. She was provided verbal feedback regarding the findings and impression during this visit, and her questions were answered. A copy of the report was provided at the conclusion of the visit.  DISPOSITION   Patient will follow up with the referring provider, Ms. Wertman. No follow-up neuropsychological testing was scheduled at this time. Please feel free to refer the patient for repeated evaluation if she shows a significant change in neurocognitive status.  SERVICE   This feedback session was conducted by Renda Beckwith, Psy.D. [place] unit of [id number] (35 minutes) was billed for Dr. Beckwith' time spent in preparing, conducting, and documenting the current feedback session.  This  report was generated using voice recognition software. While this document has been carefully reviewed, transcription errors may be present. I apologize in advance for any inconvenience. Please contact me if further clarification is needed.

## 2024-07-24 ENCOUNTER — Ambulatory Visit: Admitting: Physician Assistant

## 2024-07-24 ENCOUNTER — Ambulatory Visit

## 2024-08-13 ENCOUNTER — Other Ambulatory Visit: Payer: Self-pay

## 2024-08-13 ENCOUNTER — Emergency Department (HOSPITAL_BASED_OUTPATIENT_CLINIC_OR_DEPARTMENT_OTHER)
Admission: EM | Admit: 2024-08-13 | Discharge: 2024-08-13 | Disposition: A | Discharge status: Home / Self Care | Payer: Worker's Compensation | Attending: Emergency Medicine | Admitting: Emergency Medicine

## 2024-08-13 DIAGNOSIS — Z7984 Long term (current) use of oral hypoglycemic drugs: Secondary | ICD-10-CM | POA: Insufficient documentation

## 2024-08-13 DIAGNOSIS — Z79899 Other long term (current) drug therapy: Secondary | ICD-10-CM | POA: Insufficient documentation

## 2024-08-13 DIAGNOSIS — L509 Urticaria, unspecified: Secondary | ICD-10-CM | POA: Insufficient documentation

## 2024-08-13 DIAGNOSIS — T7840XA Allergy, unspecified, initial encounter: Secondary | ICD-10-CM | POA: Diagnosis present

## 2024-08-13 MED ORDER — METHYLPREDNISOLONE SODIUM SUCC 125 MG IJ SOLR
125.0000 mg | Freq: Once | INTRAMUSCULAR | Status: AC
  Administered 2024-08-13: 125 mg via INTRAVENOUS
  Filled 2024-08-13: qty 2

## 2024-08-13 MED ORDER — DIPHENHYDRAMINE HCL 50 MG/ML IJ SOLN
50.0000 mg | Freq: Once | INTRAMUSCULAR | Status: AC
  Administered 2024-08-13: 50 mg via INTRAVENOUS
  Filled 2024-08-13: qty 1

## 2024-08-13 MED ORDER — FAMOTIDINE 20 MG PO TABS
40.0000 mg | ORAL_TABLET | Freq: Once | ORAL | Status: AC
  Administered 2024-08-13: 40 mg via ORAL
  Filled 2024-08-13: qty 2

## 2024-08-13 MED ORDER — PREDNISONE 10 MG (21) PO TBPK
ORAL_TABLET | Freq: Every day | ORAL | 0 refills | Status: DC
Start: 2024-08-13 — End: 2024-10-05

## 2024-08-13 NOTE — ED Provider Notes (Signed)
 [place] EMERGENCY DEPARTMENT AT [facility] Provider Note   CSN: [id number] Arrival date & time: 08/13/24  1841     Patient presents with: Insect Bite   [facility] Lisa Costa is a 73 y.o. female who presents emergency department with a chief complaint of possible insect bite.  Patient states that earlier today around 4 PM she was cleaning around the house and was cleaning some spider webs.  She states that she felt a bite mark on her chest as well as her neck, when she felt it on her chest she reached down and saw that there was a spider on her chest.  She states that she tried to kill the spider but that it got away.  She also states that she recently broke a rib and today was her first day using lidocaine patches.  She states however that she put the lidocaine patch on 8 AM this morning and did not have any issue until the spider bite occurred.  Denies previous history of allergic reactions.  Denies issues breathing, swallowing, throat, or tongue swelling.  Denies chest pain or shortness of breath.  Patient does states that she has a red hive-like rash all over her upper and lower body.  Medical history significant for hypercholesterolemia, anxiety and depression, hyperlipidemia, palpitations, etc.  {Add pertinent medical, surgical, social history, OB history to HPI:32947} HPI     Prior to Admission medications   Medication Sig Start Date End Date Taking? Authorizing Provider  cetirizine  (ZYRTEC ) 10 MG tablet Take by mouth.    [provider]  chlorhexidine (PERIDEX) 0.12 % solution  10/17/18   [provider]  Cholecalciferol (VITAMIN D3) 1000 units CAPS Take by mouth.    [provider]  diltiazem  (CARDIZEM  CD) 120 MG 24 hr capsule Take 1 capsule (120 mg total) by mouth daily. 09/23/23   Patwardhan, Manish J, MD  diltiazem  (CARDIZEM ) 30 MG tablet TAKE 1 TABLET BY MOUTH AS NEEDED FOR  PALPITATIONS.  MAX  DOSE  OF  4  TABLETS  PER  24  HOUR   PERIOD 06/17/23   Patwardhan, Newman PARAS, MD  ibuprofen  (ADVIL ,MOTRIN ) 800 MG tablet Take 1 tablet (800 mg total) by mouth every 8 (eight) hours as needed. 11/08/18   Loreli Elyn SAILOR, MD  meclizine  (ANTIVERT ) 12.5 MG tablet Take 1 tablet (12.5 mg total) by mouth 3 (three) times daily as needed for dizziness. 05/21/17   Loreli Elyn SAILOR, MD  metFORMIN  (GLUCOPHAGE -XR) 500 MG 24 hr tablet Take 500 mg by mouth daily with breakfast. 08/30/23   [provider]  pantoprazole  (PROTONIX ) 40 MG tablet Take 1 tablet by mouth daily. 02/15/22   [provider]  rosuvastatin  (CRESTOR ) 10 MG tablet Take 1 tablet (10 mg total) by mouth daily. 09/23/23 06/04/24  Patwardhan, Newman PARAS, MD  triamcinolone  (KENALOG ) 0.1 % Apply topically 2 (two) times daily. 01/19/21   [provider]    Allergies: Tramadol and Other    Review of Systems  Skin:  Positive for rash (hive-like rash over upper body).    Updated Vital Signs BP (!) 149/79   Pulse 86   Temp 97.9 F (36.6 C)   Resp 16   Ht 5' 4 (1.626 m)   Wt 72.6 kg   SpO2 97%   BMI 27.46 kg/m   Physical Exam Vitals and nursing note reviewed.  Constitutional:      General: She is awake. She is not in acute distress.  Appearance: Normal appearance. She is not ill-appearing, toxic-appearing or diaphoretic.  HENT:     Head: Normocephalic and atraumatic.     Mouth/Throat:     Mouth: Mucous membranes are moist. No angioedema.     Tongue: Tongue does not deviate from midline.     Palate: No lesions.     Pharynx: No uvula swelling.  Eyes:     General: No scleral icterus. Cardiovascular:     Rate and Rhythm: Normal rate and regular rhythm.  Pulmonary:     Effort: Pulmonary effort is normal. No respiratory distress.     Breath sounds: Normal breath sounds. No wheezing, rhonchi or rales.  Musculoskeletal:        General: Normal range of motion.     Cervical back: Normal range of motion.     Right lower leg: No edema.     Left lower leg: No  edema.  Skin:    General: Skin is warm.     Capillary Refill: Capillary refill takes less than 2 seconds.     Comments: Erythematous hive-like rash present over entire upper body extending down extremities and up neck, rash also present on trunk and lower extremities  Neurological:     General: No focal deficit present.     Mental Status: She is alert and oriented to person, place, and time.  Psychiatric:        Mood and Affect: Mood normal.        Behavior: Behavior normal. Behavior is cooperative.     (all labs ordered are listed, but only abnormal results are displayed) Labs Reviewed - No data to display  EKG: None  Radiology: No results found.  {Document cardiac monitor, telemetry assessment procedure when appropriate:32947} Procedures   Medications Ordered in the ED  famotidine  (PEPCID ) tablet 40 mg (has no administration in time range)  diphenhydrAMINE  (BENADRYL ) injection 50 mg (has no administration in time range)  methylPREDNISolone  sodium succinate (SOLU-MEDROL ) 125 mg/2 mL injection 125 mg (has no administration in time range)      {Click here for ABCD2, HEART and other calculators REFRESH Note before signing:1}                              Medical Decision Making Risk Prescription drug management.     Patient presents to the ED for concern of ***, this involves an extensive number of treatment options, and is a complaint that carries with it a high risk of complications and morbidity.  The differential diagnosis includes ***   Co morbidities that complicate the patient evaluation  ***   Additional history obtained:  Additional history obtained from *** {Blank multiple:19196::EMS,Family,Nursing,Outside Medical Records,Past Admission}   External records from outside source obtained and reviewed including ***   Lab Tests:  I Ordered, and personally interpreted labs.  The pertinent results include:  ***   Imaging Studies ordered:  I  ordered imaging studies including ***  I independently visualized and interpreted imaging which showed *** I agree with the radiologist interpretation   Cardiac Monitoring:  The patient was maintained on a cardiac monitor.  I personally viewed and interpreted the cardiac monitored which showed an underlying rhythm of: ***   Medicines ordered and prescription drug management:  I ordered medication including ***  for ***  Reevaluation of the patient after these medicines showed that the patient {resolved/improved/worsened:23923::improved} I have reviewed the patients home medicines and have made adjustments as needed  Test Considered:  ***   Critical Interventions:  ***   Consultations Obtained:  I requested consultation with the ***,  and discussed lab and imaging findings as well as pertinent plan - they recommend: ***   Problem List / ED Course:  73 year old female, possible allergic reaction, vital signs stable, denies mouth or throat swelling On physical exam widespread hive-like rash present diffusely across body including trunk and bilateral upper and lower extremities and also neck, patient talking in full sentences on room air, no respiratory distress, no tripoding, no uvular deviation, no tongue swelling, no sign of angioedema Plan to symptomatically treat with Benadryl , famotidine , steroids and reassess, based off timeline more suspicious for allergic reaction to spider bite as opposed to lidocaine patch as it was placed at 8 AM this morning   Reevaluation:  After the interventions noted above, I reevaluated the patient and found that they have :{resolved/improved/worsened:23923::improved}   Social Determinants of Health:  ***   Dispostion:  After consideration of the diagnostic results and the patients response to treatment, I feel that the patent would benefit from ***.    {Document critical care time when appropriate  Document review of labs and  clinical decision tools ie CHADS2VASC2, etc  Document your independent review of radiology images and any outside records  Document your discussion with family members, caretakers and with consultants  Document social determinants of health affecting pt's care  Document your decision making why or why not admission, treatments were needed:32947:::1}   Final diagnoses:  None    ED Discharge Orders     None

## 2024-08-13 NOTE — Discharge Instructions (Addendum)
 It was a pleasure taking care of you today, based on your history and physical exam I feel you are safe for discharge.  Today you improved with medications in the emergency department for allergic reaction.  Please continue to monitor your symptoms and if you experience any of the following symptoms including but not limited to face swelling, throat swelling, tongue swelling, trouble breathing, trouble swallowing, or other concerning symptom please return to the emergency department.  You have been prescribed an outpatient steroid, please take as instructed and complete the entire course.  If symptoms persist or worsen recommend follow-up with your primary care provider within 48 hours or return to the emergency department.  The rash should continue to get better, if it worsens please come back.

## 2024-08-13 NOTE — ED Triage Notes (Addendum)
 Pt POV reporting hives on abd, arms, and face after spider bite on abd 2 hours ago while cleaning. Denies SOB, no throat or mouth swelling. Also reports using lidocaine patch for broken rib for the first time today.

## 2024-09-26 ENCOUNTER — Ambulatory Visit: Admitting: Cardiology

## 2024-10-05 ENCOUNTER — Encounter: Payer: Self-pay | Admitting: Cardiology

## 2024-10-05 ENCOUNTER — Ambulatory Visit (INDEPENDENT_AMBULATORY_CARE_PROVIDER_SITE_OTHER)

## 2024-10-05 ENCOUNTER — Ambulatory Visit: Attending: Cardiology | Admitting: Cardiology

## 2024-10-05 VITALS — BP 118/75 | HR 66 | Ht 64.0 in

## 2024-10-05 DIAGNOSIS — I471 Supraventricular tachycardia, unspecified: Secondary | ICD-10-CM

## 2024-10-05 DIAGNOSIS — E782 Mixed hyperlipidemia: Secondary | ICD-10-CM

## 2024-10-05 NOTE — Progress Notes (Signed)
 Cardiology Office Note:  .   Date:  10/05/2024  ID:  Lisa Costa, DOB [birth year], MRN [id number] PCP: Debrah Josette MOHR PA-C  [facility] Providers Cardiologist:  Newman Lawrence, MD PCP: Debrah Josette ORN., PA-C  Chief Complaint  Patient presents with   Palpitations      History of Present Illness: .    [facility] [place] Durell is a 73 y.o. female with PSVT, hyperlipidemia, prediabetes, aortic atherosclerosis   Last time, patient had an episode of palpitations that woke her up from sleep, and made her feel like she was dying she denies any complaints of chest pain or shortness of breath.  She stays active, working as a mudlogger.  Vitals:   10/05/24 0923  BP: 118/75  Pulse: 66  SpO2: 94%      ROS:  Review of Systems  Cardiovascular:  Positive for palpitations. Negative for chest pain, dyspnea on exertion, leg swelling and syncope.     Studies Reviewed: SABRA        EKG 10/05/2024: Normal sinus rhythm Normal ECG When compared with ECG of 18-Jan-2003 16:16, Questionable change in QRS axis T wave amplitude has decreased in Anterior leads   Labs 09/06/2023: Chol 150, TG 111, HDL 54, LDL 76  Echocardiogram 05/26/2023: Normal LV systolic function with visual EF 60-65%. Left ventricle cavity is normal in size. Normal left ventricular wall thickness. Normal global wall motion. Normal diastolic filling pattern, normal LAP. Aortic valve sclerosis without stenosis. Trace aortic regurgitation. Mild (Grade I) mitral regurgitation. Mild tricuspid regurgitation. No evidence of pulmonary hypertension. No prior study for comparison.  Exercise nuclear stress test 05/16/2023: Myocardial perfusion is normal. Overall LV systolic function is normal without regional wall motion abnormalities. Stress LV EF: 61%.  Normal ECG stress. The patient exercised for 4 minutes and 40 seconds of a Bruce protocol, achieving approximately 6.62 METs & 95%  MPHR. Exercise capacity reduced.  The blood pressure response was normal. No chest pain.  No previous exam available for comparison. Low risk.        Physical Exam:   Physical Exam Vitals and nursing note reviewed.  Constitutional:      General: She is not in acute distress. Neck:     Vascular: No JVD.  Cardiovascular:     Rate and Rhythm: Normal rate and regular rhythm.     Heart sounds: Normal heart sounds. No murmur heard. Pulmonary:     Effort: Pulmonary effort is normal.     Breath sounds: Normal breath sounds. No wheezing or rales.  Musculoskeletal:     Right lower leg: No edema.     Left lower leg: No edema.      VISIT DIAGNOSES:   ICD-10-CM   1. PSVT (paroxysmal supraventricular tachycardia)  I47.10 EKG 12-Lead    Cardiac event monitor    2. Mixed hyperlipidemia  E78.2         ASSESSMENT AND PLAN: .    Lisa Costa is a 73 y.o. female with  PSVT, hyperlipidemia, prediabetes, aortic atherosclerosis   PSVT: Recurrent and increasing symptoms of  palpitations.  Repeat 30-day monitor to assess SVT burden. Continue diltiazem  120 mg daily, and diltiazem  30 mg as needed. Discussed vagal maneuvers.  She is not keen on on ablation at this time.    Elevated coronary calcium  score, hyperlipidemia, aortic atherosclerosis: Mildly elevated calcium  score of 12.  Lipids well-controlled on Crestor  10 mg daily. Continue the same.  No orders of the defined  types were placed in this encounter.    F/u in 8 weeks  Signed, Newman JINNY Lawrence, MD

## 2024-10-05 NOTE — Patient Instructions (Addendum)
 Medication Instructions:  Your physician recommends that you continue on your current medications as directed. Please refer to the Current Medication list given to you today.  *If you need a refill on your cardiac medications before your next appointment, please call your pharmacy*   Testing/Procedures: [employer] Cardiac Event Monitor Instructions  Your physician has requested you wear your cardiac event monitor for 30 days. [employer] may call or text to confirm a shipping address. The monitor will be sent to a land address via UPS. [employer] will not ship a monitor to a PO BOX. It typically takes 3-5 days to receive your monitor after it has been enrolled. [employer] will assist with USPS tracking if your package is delayed. The telephone number for [employer] is [phone]. Once you have received your monitor, please review the enclosed instructions. Instruction tutorials can also be viewed under help and settings on the enclosed cell phone. Your monitor has already been registered assigning a specific monitor serial # to you.  Billing and Self Pay Discount Information  [employer] has been provided the insurance information we had on file for you.  If your insurance has been updated, please call [employer] at [phone] to provide them with your updated insurance information.   [employer] offers a discounted Self Pay option for patients who have insurance that does not cover their cardiac event monitor or patients without insurance.  The discounted cost of a Self Pay Cardiac Event Monitor would be $225.00 , if the patient contacts [employer] at [phone] within 7 days of applying the monitor to make payment arrangements.  If the patient does not contact [employer] within 7 days of applying the monitor, the cost of the cardiac event monitor will be $350.00.  Applying the monitor  Remove cell phone from case and turn it on. The cell phone works as it consultant and needs to be  within [employer] of you at all times. The cell phone will need to be charged on a daily basis. We recommend you plug the cell phone into the enclosed charger at your bedside table every night.  Monitor batteries: You will receive two monitor batteries labelled #1 and #2. These are your recorders. Plug battery #2 onto the second connection on the enclosed charger. Keep one battery on the charger at all times. This will keep the monitor battery deactivated. It will also keep it fully charged for when you need to switch your monitor batteries. A small light will be blinking on the battery emblem when it is charging. The light on the battery emblem will remain on when the battery is fully charged.  Open package of a Monitor strip. Insert battery #1 into black hood on strip and gently squeeze monitor battery onto connection as indicated in instruction booklet. Set aside while preparing skin.  Choose location for your strip, vertical or horizontal, as indicated in the instruction booklet. Shave to remove all hair from location. There cannot be any lotions, oils, powders, or colognes on skin where monitor is to be applied. Wipe skin clean with enclosed Saline wipe. Dry skin completely.  Peel paper labeled #1 off the back of the Monitor strip exposing the adhesive. Place the monitor on the chest in the vertical or horizontal position shown in the instruction booklet. One arrow on the monitor strip must be pointing upward. Carefully remove paper labeled #2, attaching remainder of strip to your skin. Try not to create any folds or wrinkles in the strip as you apply it.  Firmly press and  release the circle in the center of the monitor battery. You will hear a small beep. This is turning the monitor battery on. The heart emblem on the monitor battery will light up every 5 seconds if the monitor battery in turned on and connected to the patient securely. Do not push and hold the circle down as this turns  the monitor battery off. The cell phone will locate the monitor battery. A screen will appear on the cell phone checking the connection of your monitor strip. This may read poor connection initially but change to good connection within the next minute. Once your monitor accepts the connection you will hear a series of 3 beeps followed by a climbing crescendo of beeps. A screen will appear on the cell phone showing the two monitor strip placement options. Touch the picture that demonstrates where you applied the monitor strip.  Your monitor strip and battery are waterproof. You are able to shower, bathe, or swim with the monitor on. They just ask you do not submerge deeper than 3 feet underwater. We recommend removing the monitor if you are swimming in a lake, river, or ocean.  Your monitor battery will need to be switched to a fully charged monitor battery approximately once a week. The cell phone will alert you of an action which needs to be made.  On the cell phone, tap for details to reveal connection status, monitor battery status, and cell phone battery status. The green dots indicates your monitor is in good status. A red dot indicates there is something that needs your attention.  To record a symptom, click the circle on the monitor battery. In 30-60 seconds a list of symptoms will appear on the cell phone. Select your symptom and tap save. Your monitor will record a sustained or significant arrhythmia regardless of you clicking the button. Some patients do not feel the heart rhythm irregularities. [employer] will notify us  of any serious or critical events.  Refer to instruction booklet for instructions on switching batteries, changing strips, the Do not disturb or Pause features, or any additional questions.  Call [employer] at [phone], to confirm your monitor is transmitting and record your baseline. They will answer any questions you may have regarding the monitor  instructions at that time.  Returning the monitor to [employer]  Place all equipment back into blue box. Peel off strip of paper to expose adhesive and close box securely. There is a prepaid UPS shipping label on this box. Drop in a UPS drop box, or at a UPS facility like [employer]. You may also contact [employer] to arrange UPS to pick up monitor package at your home.   Follow-Up: At [facility], you and your health needs are our priority.  As part of our continuing mission to provide you with exceptional heart care, our providers are all part of one team.  This team includes your primary Cardiologist (physician) and Advanced Practice Providers or APPs (Physician Assistants and Nurse Practitioners) who all work together to provide you with the care you need, when you need it.  Your next appointment:   8 week(s)  Provider:   Newman JINNY Lawrence, MD

## 2024-10-29 ENCOUNTER — Other Ambulatory Visit: Payer: Self-pay | Admitting: Cardiology

## 2024-11-08 ENCOUNTER — Ambulatory Visit: Payer: Self-pay | Admitting: Cardiology

## 2024-11-08 DIAGNOSIS — I471 Supraventricular tachycardia, unspecified: Secondary | ICD-10-CM | POA: Diagnosis not present

## 2024-11-25 NOTE — Progress Notes (Signed)
 "   Memory Concerns   Lisa Costa Lisa Costa is a very pleasant 73 y.o. RH female with a history ofhypertension, hyperlipidemia, anxiety, depression, osteoporosis prediabetes  presenting today in follow-up for evaluation of memory concerns. Neurocognitive testing was not of concern for neurodegenerative disease.   Patient was last seen on 06/04/24 . Memory is stable.  ***. MMSE today is  /30.Patient is able to participate on ADLs and to to drive without difficulties. Mood is ***. This patient is accompanied in the office by ***  who supplements the history. Previous records as well as any outside records available were reviewed prior to todays visit    Continue to control mood as per PCP Recommend good control of cardiovascular risk factors Increase socialization  No follow up or antidementia medication is indicated      Discussed the use of AI scribe software for clinical note transcription with the patient, who gave verbal consent to proceed.  History of Present Illness  Initial Visit 06/04/24 How long did patient have memory difficulties?  For the last 2 years, after the death of her son, initially attributed to grief.   For the last 3 months, at times she is forgetting to close the cabinets and forgetting to turn the lights off.She admits being a perfectionist, so this things affect me.  Reports some difficulty remembering new information, conversations and names.  Her long-term memory is good.  Not frequently, she has more difficulty with  multitasking, unable to finish one project before starting another.  She does not like brain exercise activities such as puzzles and solitaire.  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 I am not very sociable. My daughter told me this is not healthy, I am planning to reduce my hours-she says.    repeats oneself?   Endorsed by her daughters.  Disoriented when walking into a room?  Patient denies except occasionally not remembering what patient came to the room for    Leaving objects in unusual places? Denies.   Wandering behavior?  Denies, but when she goes out she needs to remind herself of where she is going. Any personality changes?  Denies.   Any history of depression?:  Endorsed, she lost her son 2 years ago, has sought grief counseling, now she is interested in seeking Spanish-speaking only counseling. Hallucinations or paranoia?  Denies   Seizures?  Denies    Any sleep changes?   Sleeps well. Denies vivid dreams, REM behavior or sleepwalking   Sleep apnea?  Denies   Any hygiene concerns?  Denies   Independent of bathing and dressing?  Endorsed  Does the patient needs help with medications? Patient is in charge   Who is in charge of the finances? Patient is in charge     Any changes in appetite?  Denies     Patient have trouble swallowing? Denies.   Does the patient cook? No    Any kitchen accidents such as leaving the stove on? Denies.   Any history of headaches?   Denies.   Chronic pain ? Denies.   Ambulates with difficulty?  Denies Recent falls or head injuries? 6 years ago she had a fall, no LOC or head injury, and she continues to do PT on her own.  Vision changes? Denies.   Any stroke like symptoms? Denies.   Any tremors?  Denies.   Any anosmia?  Denies.   Any incontinence of urine?  Endorsed, stress incontinence, no recent UTI Any bowel dysfunction? Denies.      Patient lives with my son's family.    History of heavy alcohol intake? Denies.   History of heavy tobacco use? Denies.   Family history of dementia? Denies.  Does patient drive? Yes, sometimes I am not sure where I am supposed to go    MRI brain 7.23.2025 personally reviewed was normal for age, no acute intracranial abnormalities  Neuropsych evaluation Dr. Gayland 06/25/2024.  Briefly, results indicated generally  normal cognitive performance, despite assessment limitations related to language and cultural factors. There is not strong evidence of a neurocognitive disorder at this time. It is encouraging that most aspects of language function appeared intact. It is also reassuring that the level of cognitive concerns reported during the clinical interview was minimal. Subjective cognitive concerns are most likely attributable to normal aging, prolonged grief, and a demanding work schedule.      Past Medical History:  Diagnosis Date   Allergy    Anxiety    Arthritis    Cataract    both eyes   Depression    Depression    [employer] 02/06/2021   GERD (gastroesophageal reflux disease)    Heart murmur    Hx of adenomatous polyp of colon 03/16/2018   Hypercholesteremia    Hyperlipidemia    [employer] 02/06/2021   Left radial head fracture    Osteoporosis    Osteoporosis    [employer] 02/06/2021   Tuberculosis    vaccine in [place] PPD tests positive each time/had treatment for 4 months     Past Surgical History:  Procedure Laterality Date   CATARACT EXTRACTION     CESAREAN SECTION     3 births   COLONOSCOPY     POLYPECTOMY           Objective:     PHYSICAL EXAMINATION:    VITALS:  There were no vitals filed for this visit.  GEN:  The patient appears stated age and is in NAD. HEENT:  Normocephalic, atraumatic.   Neurological examination:  General: NAD, well-groomed, appears stated age. Orientation: The patient is alert. Oriented to person, place and not to date.*** Cranial nerves: There is good facial symmetry.The speech is fluent and clear. No aphasia or dysarthria. Fund of knowledge is appropriate. Recent memory and remote memory is normal.  Attention and concentration are normal.  Able to name objects and repeat phrases.  Hearing is intact to conversational tone .  Sensation: Sensation is intact to light touch throughout Motor: Strength is at least antigravity x4. DTR's 2/4 in UE/LE       06/04/2024   12:00 PM  Montreal Cognitive Assessment   Visuospatial/ Executive (0/5) 5  Naming (0/3) 3  Attention: Read list of digits (0/2) 2  Attention: Read list of letters (0/1) 1  Attention: Serial 7 subtraction starting at 100 (0/3) 3  Language: Repeat phrase (0/2) 2  Language : Fluency (0/1) 1  Abstraction (0/2) 2  Delayed Recall (0/5) 4  Orientation (0/6) 6  Total 29        No data to display            Movement examination: Tone: There is normal tone in the UE/LE Abnormal movements:  no tremor.  No myoclonus.  No asterixis.   Coordination:  There is no decremation with RAM's. Normal finger to nose  Gait and Station:  The patient has no difficulty arising out of a deep-seated chair without the use of the hands. The patient's stride length is good.  Gait is cautious and narrow.   Thank you for allowing us  the opportunity to participate in the care of this nice patient. Please do not hesitate to contact us  for any questions or concerns.   Total time spent on today's visit was *** minutes dedicated to this patient today, preparing to see patient, examining the patient, ordering tests and/or medications and counseling the patient, documenting clinical information in the EHR or other health record, independently interpreting results and communicating results to the patient/family, discussing treatment and goals, answering patient's questions and coordinating care.  Cc:  Debrah Josette ORN., PA-C  Camie Sevin 11/25/2024 1:38 PM      "

## 2024-11-27 ENCOUNTER — Encounter: Payer: Self-pay | Admitting: Physician Assistant

## 2024-11-27 ENCOUNTER — Ambulatory Visit: Admitting: Physician Assistant

## 2024-11-27 VITALS — BP 107/70 | HR 97 | Resp 20 | Ht 64.0 in | Wt 164.0 lb

## 2024-11-27 DIAGNOSIS — R419 Unspecified symptoms and signs involving cognitive functions and awareness: Secondary | ICD-10-CM | POA: Diagnosis not present

## 2024-11-27 NOTE — Patient Instructions (Signed)
 Continue psicoterapia Continue los ejercicios mentales Continue caminando y comiendo sano Socializacion!

## 2024-12-06 ENCOUNTER — Encounter: Payer: Self-pay | Admitting: Cardiology

## 2024-12-06 ENCOUNTER — Ambulatory Visit: Attending: Cardiology | Admitting: Cardiology

## 2024-12-06 VITALS — BP 113/65 | HR 65 | Ht 64.0 in | Wt 165.7 lb

## 2024-12-06 DIAGNOSIS — E782 Mixed hyperlipidemia: Secondary | ICD-10-CM

## 2024-12-06 DIAGNOSIS — R002 Palpitations: Secondary | ICD-10-CM | POA: Diagnosis not present

## 2024-12-06 DIAGNOSIS — I471 Supraventricular tachycardia, unspecified: Secondary | ICD-10-CM

## 2024-12-06 MED ORDER — ROSUVASTATIN CALCIUM 10 MG PO TABS: 10.0000 mg | ORAL_TABLET | Freq: Every day | ORAL | 3 refills | Status: AC

## 2024-12-06 MED ORDER — DILTIAZEM HCL ER COATED BEADS 120 MG PO CP24: 120.0000 mg | ORAL_CAPSULE | Freq: Every day | ORAL | 3 refills | Status: AC

## 2024-12-06 MED ORDER — DILTIAZEM HCL 30 MG PO TABS: ORAL_TABLET | ORAL | 0 refills | Status: DC

## 2024-12-06 NOTE — Patient Instructions (Signed)
 Medication Instructions:  REFILLS SENT IN TODAY  *If you need a refill on your cardiac medications before your next appointment, please call your pharmacy*   Follow-Up: At [facility], you and your health needs are our priority.  As part of our continuing mission to provide you with exceptional heart care, our providers are all part of one team.  This team includes your primary Cardiologist (physician) and Advanced Practice Providers or APPs (Physician Assistants and Nurse Practitioners) who all work together to provide you with the care you need, when you need it.  Your next appointment:   6 month(s)  Provider:   One of our Advanced Practice Providers (APPs) OR Dr. [place]: Morse Clause, PA-C  Lamarr Satterfield, NP Miriam Shams, NP  Olivia Pavy, PA-C Josefa Beauvais, NP  Leontine Salen, PA-C Orren Fabry, PA-C  [place], PA-C Ernest Dick, NP  Damien Braver, NP Jon Hails, PA-C  Waddell Donath, PA-C    Dayna Dunn, PA-C  Scott Weaver, PA-C Lum Louis, NP Katlyn West, NP Callie Goodrich, PA-C  Xika Zhao, NP Sheng Haley, PA-C    Kathleen Johnson, PA-C

## 2024-12-06 NOTE — Progress Notes (Signed)
 " Cardiology Office Note:  .   Date:  12/06/2024  ID:  Lisa Costa, DOB 03-21-1951, MRN [id number] PCP: Debrah Josette MOHR PA-C  [facility] Providers Cardiologist:  Newman Lawrence, MD PCP: Debrah Josette ORN., PA-C  Chief Complaint  Patient presents with   SVT      History of Present Illness: .    [facility] Wylodean Shimmel is a 74 y.o. female with PSVT, hyperlipidemia, prediabetes, aortic atherosclerosis   Patient is doing well, has only had occasional symptoms of palpitations.  Reviewed recent monitor results with the patient, details below.  Vitals:   12/06/24 0943  BP: 113/65  Pulse: 65  SpO2: 98%      ROS:  Review of Systems  Cardiovascular:  Positive for palpitations. Negative for chest pain, dyspnea on exertion, leg swelling and syncope.     Studies Reviewed: SABRA        EKG 10/05/2024: Normal sinus rhythm Normal ECG When compared with ECG of 18-Jan-2003 16:16, Questionable change in QRS axis T wave amplitude has decreased in Anterior leads  Event monitor 30 days 10/05/2024 - 11/03/2024: Dominant rhythm: Sinus. HR 50-197 bpm. Avg HR 71 bpm. 2 episodes of SVT, fastest at 197 bpm for up to 15 beats. No atrial fibrillation/atrial flutter/VT/high grade AV block, sinus pause >3sec noted. <1% PAC, PVC. 18 patient triggered and 5 auto trigerred events.    Echocardiogram 04/2023: Normal LV systolic function with visual EF 60-65%. Left ventricle cavity is normal in size. Normal left ventricular wall thickness. Normal global wall motion. Normal diastolic filling pattern, normal LAP. Aortic valve sclerosis without stenosis. Trace aortic regurgitation. Mild (Grade I) mitral regurgitation. Mild tricuspid regurgitation. No evidence of pulmonary hypertension. No prior study for comparison.  Exercise nuclear stress test 04/2023: Myocardial perfusion is normal. Overall LV systolic function is normal without regional wall motion abnormalities.  Stress LV EF: 61%.  Normal ECG stress. The patient exercised for 4 minutes and 40 seconds of a Bruce protocol, achieving approximately 6.62 METs & 95% MPHR. Exercise capacity reduced.  The blood pressure response was normal. No chest pain.  No previous exam available for comparison. Low risk.      Labs 09/06/2023: Chol 150, TG 111, HDL 54, LDL 76   Physical Exam:   Physical Exam Vitals and nursing note reviewed.  Constitutional:      General: She is not in acute distress. Neck:     Vascular: No JVD.  Cardiovascular:     Rate and Rhythm: Normal rate and regular rhythm.     Heart sounds: Normal heart sounds. No murmur heard. Pulmonary:     Effort: Pulmonary effort is normal.     Breath sounds: Normal breath sounds. No wheezing or rales.  Musculoskeletal:     Right lower leg: No edema.     Left lower leg: No edema.      VISIT DIAGNOSES:   ICD-10-CM   1. PSVT (paroxysmal supraventricular tachycardia)  I47.10     2. Palpitations  R00.2 diltiazem  (CARDIZEM ) 30 MG tablet    3. Mixed hyperlipidemia  E78.2          ASSESSMENT AND PLAN: .    Lisa Costa Lisa Costa is a 74 y.o. female with  PSVT, hyperlipidemia, prediabetes, aortic atherosclerosis   PSVT: Symptoms controlled on vagal maneuvers and diltiazem  120 mg daily, and diltiazem  30 mg as needed. She is not keen on on ablation at this time.    Elevated coronary calcium  score, hyperlipidemia,  aortic atherosclerosis: Mildly elevated calcium  score of 12.  Lipids well-controlled on Crestor  10 mg daily. Continue the same.  No orders of the defined types were placed in this encounter.    F/u in 6 months  Signed, Newman JINNY Lawrence, MD  "

## 2024-12-25 ENCOUNTER — Other Ambulatory Visit: Payer: Self-pay | Admitting: Cardiology

## 2024-12-25 DIAGNOSIS — R002 Palpitations: Secondary | ICD-10-CM
